# Patient Record
Sex: Female | Born: 1950
Health system: Southern US, Community
[De-identification: ages and names within clinical notes are randomized; demographics above are authoritative.]

## PROBLEM LIST (undated history)

## (undated) DIAGNOSIS — D869 Sarcoidosis, unspecified: Secondary | ICD-10-CM

## (undated) DIAGNOSIS — H409 Unspecified glaucoma: Secondary | ICD-10-CM

## (undated) DIAGNOSIS — E119 Type 2 diabetes mellitus without complications: Secondary | ICD-10-CM

## (undated) DIAGNOSIS — T4145XA Adverse effect of unspecified anesthetic, initial encounter: Secondary | ICD-10-CM

## (undated) DIAGNOSIS — S42209A Unspecified fracture of upper end of unspecified humerus, initial encounter for closed fracture: Secondary | ICD-10-CM

## (undated) DIAGNOSIS — Z9889 Other specified postprocedural states: Secondary | ICD-10-CM

## (undated) DIAGNOSIS — R112 Nausea with vomiting, unspecified: Secondary | ICD-10-CM

## (undated) DIAGNOSIS — I10 Essential (primary) hypertension: Secondary | ICD-10-CM

## (undated) DIAGNOSIS — N898 Other specified noninflammatory disorders of vagina: Secondary | ICD-10-CM

## (undated) DIAGNOSIS — I428 Other cardiomyopathies: Secondary | ICD-10-CM

## (undated) DIAGNOSIS — E785 Hyperlipidemia, unspecified: Secondary | ICD-10-CM

## (undated) DIAGNOSIS — D229 Melanocytic nevi, unspecified: Secondary | ICD-10-CM

## (undated) DIAGNOSIS — T8859XA Other complications of anesthesia, initial encounter: Secondary | ICD-10-CM

## (undated) DIAGNOSIS — I8390 Asymptomatic varicose veins of unspecified lower extremity: Secondary | ICD-10-CM

## (undated) HISTORY — DX: Other cardiomyopathies: I42.8

## (undated) HISTORY — DX: Asymptomatic varicose veins of unspecified lower extremity: I83.90

## (undated) HISTORY — DX: Unspecified glaucoma: H40.9

## (undated) HISTORY — PX: APPENDECTOMY: SHX54

## (undated) HISTORY — PX: COLONOSCOPY: SHX174

## (undated) HISTORY — PX: TUBAL LIGATION: SHX77

## (undated) HISTORY — PX: TONSILLECTOMY: SUR1361

## (undated) HISTORY — DX: Melanocytic nevi, unspecified: D22.9

## (undated) HISTORY — DX: Other specified noninflammatory disorders of vagina: N89.8

---
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
## 1998-08-24 ENCOUNTER — Encounter: Admission: RE | Admit: 1998-08-24 | Discharge: 1998-11-22 | Payer: Self-pay | Admitting: Internal Medicine

## 2000-10-04 ENCOUNTER — Encounter: Payer: Self-pay | Admitting: Internal Medicine

## 2000-10-04 ENCOUNTER — Ambulatory Visit (HOSPITAL_COMMUNITY): Admission: RE | Admit: 2000-10-04 | Discharge: 2000-10-04 | Payer: Self-pay | Admitting: Internal Medicine

## 2000-10-12 ENCOUNTER — Encounter: Payer: Self-pay | Admitting: Thoracic Surgery (Cardiothoracic Vascular Surgery)

## 2000-10-16 ENCOUNTER — Ambulatory Visit (HOSPITAL_COMMUNITY)
Admission: RE | Admit: 2000-10-16 | Discharge: 2000-10-16 | Payer: Self-pay | Admitting: Thoracic Surgery (Cardiothoracic Vascular Surgery)

## 2000-10-16 ENCOUNTER — Encounter: Payer: Self-pay | Admitting: Thoracic Surgery (Cardiothoracic Vascular Surgery)

## 2000-10-16 ENCOUNTER — Encounter (INDEPENDENT_AMBULATORY_CARE_PROVIDER_SITE_OTHER): Payer: Self-pay | Admitting: Specialist

## 2000-11-29 ENCOUNTER — Ambulatory Visit: Admission: RE | Admit: 2000-11-29 | Discharge: 2000-11-29 | Payer: Self-pay | Admitting: Critical Care Medicine

## 2001-07-27 ENCOUNTER — Encounter: Payer: Self-pay | Admitting: Internal Medicine

## 2001-07-27 ENCOUNTER — Ambulatory Visit (HOSPITAL_COMMUNITY): Admission: RE | Admit: 2001-07-27 | Discharge: 2001-07-27 | Payer: Self-pay | Admitting: Internal Medicine

## 2001-09-17 ENCOUNTER — Ambulatory Visit (HOSPITAL_COMMUNITY): Admission: RE | Admit: 2001-09-17 | Discharge: 2001-09-17 | Payer: Self-pay | Admitting: Internal Medicine

## 2001-12-18 ENCOUNTER — Ambulatory Visit (HOSPITAL_COMMUNITY): Admission: RE | Admit: 2001-12-18 | Discharge: 2001-12-18 | Payer: Self-pay | Admitting: Internal Medicine

## 2001-12-18 ENCOUNTER — Encounter: Payer: Self-pay | Admitting: Internal Medicine

## 2002-05-08 ENCOUNTER — Ambulatory Visit (HOSPITAL_COMMUNITY): Admission: RE | Admit: 2002-05-08 | Discharge: 2002-05-08 | Payer: Self-pay | Admitting: Urology

## 2002-05-08 ENCOUNTER — Encounter: Payer: Self-pay | Admitting: Urology

## 2003-03-10 ENCOUNTER — Encounter: Payer: Self-pay | Admitting: Internal Medicine

## 2003-03-10 ENCOUNTER — Ambulatory Visit (HOSPITAL_COMMUNITY): Admission: RE | Admit: 2003-03-10 | Discharge: 2003-03-10 | Payer: Self-pay | Admitting: Internal Medicine

## 2003-08-02 ENCOUNTER — Emergency Department (HOSPITAL_COMMUNITY): Admission: EM | Admit: 2003-08-02 | Discharge: 2003-08-03 | Payer: Self-pay | Admitting: Internal Medicine

## 2004-03-18 ENCOUNTER — Ambulatory Visit (HOSPITAL_COMMUNITY): Admission: RE | Admit: 2004-03-18 | Discharge: 2004-03-18 | Payer: Self-pay | Admitting: Internal Medicine

## 2005-05-02 ENCOUNTER — Ambulatory Visit (HOSPITAL_COMMUNITY): Admission: RE | Admit: 2005-05-02 | Discharge: 2005-05-02 | Payer: Self-pay | Admitting: Internal Medicine

## 2005-10-19 ENCOUNTER — Ambulatory Visit (HOSPITAL_COMMUNITY): Admission: RE | Admit: 2005-10-19 | Discharge: 2005-10-19 | Payer: Self-pay | Admitting: Internal Medicine

## 2005-11-10 ENCOUNTER — Ambulatory Visit (HOSPITAL_COMMUNITY): Admission: RE | Admit: 2005-11-10 | Discharge: 2005-11-10 | Payer: Self-pay | Admitting: Internal Medicine

## 2006-05-25 ENCOUNTER — Ambulatory Visit (HOSPITAL_COMMUNITY): Admission: RE | Admit: 2006-05-25 | Discharge: 2006-05-25 | Payer: Self-pay | Admitting: Internal Medicine

## 2006-06-15 ENCOUNTER — Ambulatory Visit (HOSPITAL_COMMUNITY): Admission: RE | Admit: 2006-06-15 | Discharge: 2006-06-15 | Payer: Self-pay | Admitting: Internal Medicine

## 2006-10-23 ENCOUNTER — Ambulatory Visit (HOSPITAL_COMMUNITY): Admission: RE | Admit: 2006-10-23 | Discharge: 2006-10-23 | Payer: Self-pay | Admitting: Internal Medicine

## 2007-06-08 ENCOUNTER — Ambulatory Visit (HOSPITAL_COMMUNITY): Admission: RE | Admit: 2007-06-08 | Discharge: 2007-06-08 | Payer: Self-pay | Admitting: Internal Medicine

## 2008-05-08 ENCOUNTER — Ambulatory Visit (HOSPITAL_COMMUNITY): Admission: RE | Admit: 2008-05-08 | Discharge: 2008-05-08 | Payer: Self-pay | Admitting: Internal Medicine

## 2008-06-10 ENCOUNTER — Ambulatory Visit (HOSPITAL_COMMUNITY): Admission: RE | Admit: 2008-06-10 | Discharge: 2008-06-10 | Payer: Self-pay | Admitting: Internal Medicine

## 2009-05-19 ENCOUNTER — Encounter (HOSPITAL_COMMUNITY): Admission: RE | Admit: 2009-05-19 | Discharge: 2009-06-18 | Payer: Self-pay | Admitting: Orthopedic Surgery

## 2009-06-16 ENCOUNTER — Ambulatory Visit (HOSPITAL_COMMUNITY): Admission: RE | Admit: 2009-06-16 | Discharge: 2009-06-16 | Payer: Self-pay | Admitting: Internal Medicine

## 2010-11-12 NOTE — Op Note (Signed)
[place]  Patient:    [name]                           MRN: [id number] Proc. Date: 10/16/00 Adm. Date:  [id number] Disc. Date: [id number] Attending:  Charlett Lango CC:         Dr. Carylon Perches   Operative Report  PREOPERATIVE DIAGNOSIS:  Mediastinal adenopathy.  POSTOPERATIVE DIAGNOSIS:  Mediastinal adenopathy.  Probable sarcoidosis.  OPERATION PERFORMED:  Bronchoscopy and mediastinoscopy.  SURGEON:  Salvatore Decent. Dorris Fetch, M.D.  ASSISTANT:  None.  ANESTHESIA:  General.  FINDINGS:  Grossly enlarged level 4R and 7 lymph nodes.  The nodes were friable and necrotic and frozen section revealed noncaseating granulomas consistent with sarcoidosis.  INDICATIONS FOR PROCEDURE:  The patient is a 60 year old white female who presents with a one-month history of cough and shortness of breath with exertion.  She had a chest x-ray performed which showed pulmonary nodules. She then underwent a CT scan which showed three separate pulmonary nodules as well as significant mediastinal and hilar adenopathy.  Differential diagnosis includes fungal, AFB infection, sarcoidosis and lymphoma.  The patient was referred for diagnostic biopsies.  The indications, risks, benefits and alternative procedures were discussed in detail with the patient and she understood the risks of mediastinoscopy and agreed to proceed.  DESCRIPTION OF PROCEDURE:  Ms. Rider was brought to the preop holding area on October 16, 2000.  Lines were placed for intravenous access and arterial blood pressure monitoring.  The patient was taken to the operating room, anesthetized and intubated.  Flexible fiberoptic bronchoscopy was performed via the endotracheal tube.  It revealed no endobronchial lesions.  There was some blunting at the carina.  Washings were taken from the right, middle and lower lobes and sent for cytology.  Next, the neck and chest were prepped and draped in the usual  fashion.  A transverse incision was made one fingerbreadth above the sternal notch and carried through the skin and subcutaneous tissues.  The dissection then was continued down in the midline to the pretracheal fascia which was identified and incised.  The pretracheal plane then was developed bluntly into the mediastinum using digital dissection.  The mediastinoscope then was inserted and blunt dissection was continued exposing the level 7 lymph nodes.  There were two grossly enlarged lymph nodes that were easily visible.  Biopsies of one were taken.  Essentially the whole node was biopsied in pieces.  A portion was sent for culture, another portion was sent for frozen section as well as permanent section, possible lymphoma studies.  Hemostasis was achieved with cautery.  There was minimal bleeding from that area.  Next a level 4R lymph node was identified.  This was also very easily identified.  It was grossly enlarged.  Biopsies were taken.  The middle portion of the node was very necrotic and multiple biopsies were taken.  Hemostasis was achieved with cautery.  These specimens were also sent for frozen and permanent section. After visual inspection for hemostasis, the wound was then packed with dry gauze for five minutes.  The packing was removed.  The mediastinoscope was reinserted and there was good hemostasis.  The wound then was once again packed for an additional five minutes.  In the meantime the frozen section came back with evidence of noncaseating granulomas, likely sarcoidosis. Adequate tissue was present to make a final diagnosis.  The packing was removed.  There was minimal staining.  The wound then was closed with interrupted 3-0 Vicryl sutures in the platysmal layer and the skin was closed with a 4-0 Vicryl subcuticular suture.  Sponge, needle and instrument counts were correct at the end of the procedure.  The patient tolerated the procedure well and was extubated in the  operating room and taken to the recovery room in stable condition. DD:  10/16/00 TD:  10/17/00 Job: [id number]

## 2010-11-12 NOTE — Op Note (Signed)
[facility]  Patient:    [name], [name] Visit Number: [id number] MRN: [id number]          Service Type: END Location: DAY Attending Physician:  Jonathon Bellows Dictated by:   Roetta Sessions, M.D. Proc. Date: 09/17/01 Admit Date:  09/17/2001   CC:         Carylon Perches, M.D.   Operative Report  INDICATIONS:  The patient is a 60 year old lady referred at the courtesy of Dr. Carylon Perches for colorectal cancer screening.  She is devoid of any lower GI tract symptoms.  There is no family history of colorectal neoplasia.  She has never had a lower GI tract image.  Colon cancer screening is now being undertaken via colonoscopy.  I have discussed this approach with [name]. The potential risks, benefits, and alternatives have been reviewed and questions answered.  She is agreeable. Please see my handwritten H&P for more information.  PROCEDURE NOTE:  The patient was placed in the left lateral decubitus position.  O2 saturation, blood pressure, pulse, and respirations were monitored throughout the entire procedure.  CONSCIOUS SEDATION:  Versed 3 mg IV and Demerol 50 mg IV in divided doses.  INSTRUMENT:  Olympus video chip colonoscope.  FINDINGS:  Digital rectal exam revealed no abnormalities.  ENDOSCOPIC FINDINGS:  The prep was good.  RECTUM: Examination of the rectal mucosa including a retroflexed view at the anal verge revealed no abnormalities.  COLON: The colonic mucosa was surveyed from the rectosigmoid junction through the left transverse and right colon to the area of the appendiceal orifice, ileocecal valve, and cecum. These structures were well-seen and photographed for the record.  The colonic mucosa all the way to the cecum appeared normal. From the level of the cecum and ileocecal valve, the scope was slowly withdrawn.  All previous imaged mucosal surfaces were again seen.  Again, no abnormalities were observed. The patient tolerated the procedure  well and was reacted in endoscopy.  IMPRESSION: 1. Internal hemorrhoids; otherwise normal rectum. 2. Normal colon.  RECOMMENDATIONS:  Repeat colonoscopy in ten years. Dictated by:   Roetta Sessions, M.D. Attending Physician:  Jonathon Bellows DD:  09/17/01 TD:  09/18/01 Job: [id number]

## 2011-01-10 ENCOUNTER — Other Ambulatory Visit (HOSPITAL_COMMUNITY): Payer: Self-pay | Admitting: Internal Medicine

## 2011-01-10 DIAGNOSIS — Z139 Encounter for screening, unspecified: Secondary | ICD-10-CM

## 2011-01-11 ENCOUNTER — Ambulatory Visit (HOSPITAL_COMMUNITY)
Admission: RE | Admit: 2011-01-11 | Discharge: 2011-01-11 | Disposition: A | Discharge status: Home / Self Care | Payer: BC Managed Care – PPO | Source: Ambulatory Visit | Attending: Internal Medicine | Admitting: Internal Medicine

## 2011-01-11 DIAGNOSIS — Z1231 Encounter for screening mammogram for malignant neoplasm of breast: Secondary | ICD-10-CM | POA: Insufficient documentation

## 2011-01-11 DIAGNOSIS — Z139 Encounter for screening, unspecified: Secondary | ICD-10-CM

## 2011-05-26 ENCOUNTER — Other Ambulatory Visit (HOSPITAL_COMMUNITY)
Admission: RE | Admit: 2011-05-26 | Discharge: 2011-05-26 | Disposition: A | Discharge status: Home / Self Care | Payer: BC Managed Care – PPO | Source: Ambulatory Visit | Attending: Obstetrics and Gynecology | Admitting: Obstetrics and Gynecology

## 2011-05-26 ENCOUNTER — Other Ambulatory Visit: Payer: Self-pay | Admitting: Adult Health

## 2011-05-26 DIAGNOSIS — Z01419 Encounter for gynecological examination (general) (routine) without abnormal findings: Secondary | ICD-10-CM | POA: Insufficient documentation

## 2011-12-15 ENCOUNTER — Other Ambulatory Visit (HOSPITAL_COMMUNITY): Payer: Self-pay | Admitting: Internal Medicine

## 2011-12-15 DIAGNOSIS — Z139 Encounter for screening, unspecified: Secondary | ICD-10-CM

## 2012-01-24 ENCOUNTER — Ambulatory Visit (HOSPITAL_COMMUNITY): Payer: BC Managed Care – PPO

## 2012-01-31 ENCOUNTER — Ambulatory Visit (HOSPITAL_COMMUNITY)
Admission: RE | Admit: 2012-01-31 | Discharge: 2012-01-31 | Disposition: A | Discharge status: Home / Self Care | Payer: BC Managed Care – PPO | Source: Ambulatory Visit | Attending: Internal Medicine | Admitting: Internal Medicine

## 2012-01-31 DIAGNOSIS — Z139 Encounter for screening, unspecified: Secondary | ICD-10-CM

## 2012-01-31 DIAGNOSIS — Z1231 Encounter for screening mammogram for malignant neoplasm of breast: Secondary | ICD-10-CM | POA: Insufficient documentation

## 2012-05-14 ENCOUNTER — Telehealth: Payer: Self-pay

## 2012-05-14 NOTE — Telephone Encounter (Signed)
Pt was referred ;by Dr. Ouida Sills for screening colonoscopy. She called and is going out of town, she would like a call back toward the end of Dec. For sometime in Jan, preferably a Mon. ) RMR pt). ( She is on my call list for Dec.). Last one done 09/17/2001.

## 2012-06-28 ENCOUNTER — Other Ambulatory Visit: Payer: Self-pay

## 2012-06-28 ENCOUNTER — Telehealth: Payer: Self-pay

## 2012-06-28 DIAGNOSIS — Z139 Encounter for screening, unspecified: Secondary | ICD-10-CM

## 2012-06-28 NOTE — Telephone Encounter (Signed)
Gastroenterology Pre-Procedure Form  Last colonoscopy 08/2001 by Dr. Jena Gauss  Request Date: 07/23/2012     Requesting Physician: Dr. Ouida Sills     PATIENT INFORMATION:  [name] is a 62 y.o., female (DOB=12/29/1950).  PROCEDURE: Procedure(s) requested: colonoscopy Procedure Reason: screening for colon cancer  PATIENT REVIEW QUESTIONS: The patient reports the following:   1. Diabetes Melitis: yes  2. Joint replacements in the past 12 months: no 3. Major health problems in the past 3 months: no 4. Has an artificial valve or MVP:no 5. Has been advised in past to take antibiotics in advance of a procedure like teeth cleaning: no}    MEDICATIONS & ALLERGIES:    Patient reports the following regarding taking any blood thinners:   Plavix? no Aspirin?yes  Coumadin?  no  Patient confirms/reports the following medications:  Current Outpatient Prescriptions  Medication Sig Dispense Refill  . aspirin 81 MG tablet Take 81 mg by mouth daily.      Marland Kitchen lisinopril (PRINIVIL,ZESTRIL) 40 MG tablet Take 40 mg by mouth daily.      . metFORMIN (GLUMETZA) 1000 MG (MOD) 24 hr tablet Take 1,000 mg by mouth 2 (two) times daily.      . Multiple Vitamin (MULTIVITAMIN) tablet Take 1 tablet by mouth daily.        Patient confirms/reports the following allergies:  Allergies  Allergen Reactions  . Ciprocinonide (Fluocinolone) Other (See Comments)    Decreased BP  . Macrobid (Nitrofurantoin Macrocrystal) Nausea And Vomiting  . Codeine Nausea And Vomiting  . Sulfa Antibiotics Other (See Comments)    Pt cannot remember    Patient is appropriate to schedule for requested procedure(s): yes  AUTHORIZATION INFORMATION Primary Insurance:   ID #:   Group #:  Pre-Cert / Auth required:  Pre-Cert / Auth #:   Secondary Insurance:   ID #:   Group #:  Pre-Cert / Auth required:  Pre-Cert / Auth #:   No orders of the defined types were placed in this encounter.    SCHEDULE INFORMATION: Procedure has been  scheduled as follows:  Date: 07/23/2012    Time: 7:30 AM  Location: [facility] Short Stay  This Gastroenterology Pre-Precedure Form is being routed to the following provider(s) for review: R. Roetta Sessions, MD

## 2012-07-02 ENCOUNTER — Other Ambulatory Visit (HOSPITAL_COMMUNITY): Payer: Self-pay | Admitting: Internal Medicine

## 2012-07-02 ENCOUNTER — Ambulatory Visit (HOSPITAL_COMMUNITY)
Admission: RE | Admit: 2012-07-02 | Discharge: 2012-07-02 | Disposition: A | Discharge status: Home / Self Care | Payer: BC Managed Care – PPO | Source: Ambulatory Visit | Attending: Internal Medicine | Admitting: Internal Medicine

## 2012-07-02 DIAGNOSIS — R0602 Shortness of breath: Secondary | ICD-10-CM | POA: Insufficient documentation

## 2012-07-02 MED ORDER — PEG-KCL-NACL-NASULF-NA ASC-C 100 G PO SOLR: 1.0000 | ORAL | Status: DC

## 2012-07-02 NOTE — Telephone Encounter (Signed)
Rx sent to [facility] and instructions mailed to pt.

## 2012-07-02 NOTE — Telephone Encounter (Signed)
OK to proceed with colonoscopy Take half of glucophage the day prior to the procedure Hold diabetes medications day of procedure Bring all medications and/or any insulin to the hospital the day of the procedure. Follow blood sugars, call us or PCP if any problems. Thanks

## 2012-07-03 ENCOUNTER — Other Ambulatory Visit (HOSPITAL_COMMUNITY): Payer: Self-pay | Admitting: Internal Medicine

## 2012-07-03 DIAGNOSIS — R0602 Shortness of breath: Secondary | ICD-10-CM

## 2012-07-04 ENCOUNTER — Ambulatory Visit (HOSPITAL_COMMUNITY)
Admission: RE | Admit: 2012-07-04 | Discharge: 2012-07-04 | Disposition: A | Discharge status: Home / Self Care | Payer: BC Managed Care – PPO | Source: Ambulatory Visit | Attending: Internal Medicine | Admitting: Internal Medicine

## 2012-07-04 ENCOUNTER — Encounter (HOSPITAL_COMMUNITY): Payer: Self-pay | Admitting: Pharmacy Technician

## 2012-07-04 ENCOUNTER — Other Ambulatory Visit (HOSPITAL_COMMUNITY): Payer: Self-pay | Admitting: Internal Medicine

## 2012-07-04 DIAGNOSIS — R0602 Shortness of breath: Secondary | ICD-10-CM | POA: Insufficient documentation

## 2012-07-04 DIAGNOSIS — R799 Abnormal finding of blood chemistry, unspecified: Secondary | ICD-10-CM | POA: Insufficient documentation

## 2012-07-04 DIAGNOSIS — R918 Other nonspecific abnormal finding of lung field: Secondary | ICD-10-CM | POA: Insufficient documentation

## 2012-07-04 MED ORDER — IOHEXOL 350 MG/ML SOLN
100.0000 mL | Freq: Once | INTRAVENOUS | Status: AC | PRN
  Administered 2012-07-04: 100 mL via INTRAVENOUS

## 2012-07-23 ENCOUNTER — Ambulatory Visit (HOSPITAL_COMMUNITY)
Admission: RE | Admit: 2012-07-23 | Discharge: 2012-07-23 | Disposition: A | Discharge status: Home / Self Care | Payer: BC Managed Care – PPO | Source: Ambulatory Visit | Attending: Internal Medicine | Admitting: Internal Medicine

## 2012-07-23 ENCOUNTER — Encounter (HOSPITAL_COMMUNITY): Payer: Self-pay | Admitting: *Deleted

## 2012-07-23 ENCOUNTER — Encounter (HOSPITAL_COMMUNITY)
Admission: RE | Disposition: A | Discharge status: Home / Self Care | Payer: Self-pay | Source: Ambulatory Visit | Attending: Internal Medicine

## 2012-07-23 DIAGNOSIS — Z1211 Encounter for screening for malignant neoplasm of colon: Secondary | ICD-10-CM | POA: Insufficient documentation

## 2012-07-23 DIAGNOSIS — E119 Type 2 diabetes mellitus without complications: Secondary | ICD-10-CM | POA: Insufficient documentation

## 2012-07-23 DIAGNOSIS — Z139 Encounter for screening, unspecified: Secondary | ICD-10-CM

## 2012-07-23 DIAGNOSIS — Z01812 Encounter for preprocedural laboratory examination: Secondary | ICD-10-CM | POA: Insufficient documentation

## 2012-07-23 DIAGNOSIS — D126 Benign neoplasm of colon, unspecified: Secondary | ICD-10-CM

## 2012-07-23 DIAGNOSIS — I10 Essential (primary) hypertension: Secondary | ICD-10-CM | POA: Insufficient documentation

## 2012-07-23 HISTORY — DX: Type 2 diabetes mellitus without complications: E11.9

## 2012-07-23 HISTORY — DX: Essential (primary) hypertension: I10

## 2012-07-23 HISTORY — PX: COLONOSCOPY: SHX5424

## 2012-07-23 LAB — GLUCOSE, CAPILLARY: Glucose-Capillary: 144 mg/dL — ABNORMAL HIGH (ref 70–99)

## 2012-07-23 SURGERY — COLONOSCOPY
Anesthesia: Moderate Sedation

## 2012-07-23 MED ORDER — STERILE WATER FOR IRRIGATION IR SOLN
Status: DC | PRN
  Administered 2012-07-23: 08:00:00

## 2012-07-23 MED ORDER — MEPERIDINE HCL 100 MG/ML IJ SOLN
INTRAMUSCULAR | Status: DC | PRN
  Administered 2012-07-23: 25 mg via INTRAVENOUS
  Administered 2012-07-23: 50 mg via INTRAVENOUS

## 2012-07-23 MED ORDER — ONDANSETRON HCL 4 MG/2ML IJ SOLN
INTRAMUSCULAR | Status: DC | PRN
  Administered 2012-07-23: 4 mg via INTRAVENOUS

## 2012-07-23 MED ORDER — SODIUM CHLORIDE 0.45 % IV SOLN
INTRAVENOUS | Status: DC
  Administered 2012-07-23: 07:00:00 via INTRAVENOUS

## 2012-07-23 MED ORDER — MIDAZOLAM HCL 5 MG/5ML IJ SOLN
INTRAMUSCULAR | Status: AC
  Filled 2012-07-23: qty 10

## 2012-07-23 MED ORDER — ONDANSETRON HCL 4 MG/2ML IJ SOLN
INTRAMUSCULAR | Status: AC
  Filled 2012-07-23: qty 2

## 2012-07-23 MED ORDER — MEPERIDINE HCL 100 MG/ML IJ SOLN
INTRAMUSCULAR | Status: AC
  Filled 2012-07-23: qty 1

## 2012-07-23 MED ORDER — MIDAZOLAM HCL 5 MG/5ML IJ SOLN
INTRAMUSCULAR | Status: DC | PRN
  Administered 2012-07-23: 2 mg via INTRAVENOUS
  Administered 2012-07-23: 1 mg via INTRAVENOUS
  Administered 2012-07-23: 2 mg via INTRAVENOUS

## 2012-07-23 NOTE — Op Note (Signed)
[facility] [address] [place], [address]   COLONOSCOPY PROCEDURE REPORT  PATIENT: Paige, Hatfield  MR#:         [id number] BIRTHDATE: 06/27/1951 , 61  yrs. old GENDER: Female ENDOSCOPIST: R.  Roetta Sessions, MD FACP FACG REFERRED BY:  Carylon Perches, M.D. PROCEDURE DATE:  07/23/2012 PROCEDURE:     Ileocolonoscopy with snare polypectomy  INDICATIONS: average risk colorectal cancer screening  INFORMED CONSENT:  The risks, benefits, alternatives and imponderables including but not limited to bleeding, perforation as well as the possibility of a missed lesion have been reviewed.  The potential for biopsy, lesion removal, etc. have also been discussed.  Questions have been answered.  All parties agreeable. Please see the history and physical in the medical record for more information.  MEDICATIONS: Versed 5 mg IVDemerol 75 mg milligrams IV in divided doses. Zofran 4 mg IV  DESCRIPTION OF PROCEDURE:  After a digital rectal exam was performed, the Pentax Colonoscope [id number]  colonoscope was advanced from the anus through the rectum and colon to the area of the cecum, ileocecal valve and appendiceal orifice.  The cecum was deeply intubated.  These structures were well-seen and photographed for the record.  From the level of the cecum and ileocecal valve, the scope was slowly and cautiously withdrawn.  The mucosal surfaces were carefully surveyed utilizing scope tip deflection to facilitate fold flattening as needed.  The scope was pulled down into the rectum where a thorough examination including retroflexion was performed.    FINDINGS:  Adequate preparation. Normal rectum. (2) 4x6 mm polyps in the sigmoid segment having the appearance of serrated adenomatous; otherwise, the remainder of the colonic mucosa appeared normal. The distal 10 cm of terminal ileal mucosa appeared normal.  THERAPEUTIC / DIAGNOSTIC MANEUVERS PERFORMED:  The 2 above-mentioned polyps were  cold snare removed.  COMPLICATIONS: none  CECAL WITHDRAWAL TIME:  13 minutes  IMPRESSION:  Colonic polyps-removed as described above  RECOMMENDATIONS: Followup on pathology.   _______________________________ eSigned:  R. Roetta Sessions, MD FACP [facility] 07/23/2012 8:39 AM   CC:    PATIENT NAME:  Paige, Hatfield MR#: [id number]

## 2012-07-23 NOTE — H&P (Signed)
Primary Care Physician:  Carylon Perches, MD Primary Gastroenterologist:  Dr. Jena Gauss  Pre-Procedure History & Physical: HPI:  [name] is a 62 y.o. female is here for a screening colonoscopy. No bowel symptoms. No family history of colon cancer or polyps. Reported negative colonoscopy 10 years ago.  Past Medical History  Diagnosis Date  . Hypertension   . Diabetes mellitus without complication     Past Surgical History  Procedure Date  . Colonoscopy   . Appendectomy   . Cesarean section     X 3  . Tonsillectomy   . Tubal ligation     Prior to Admission medications   Medication Sig Start Date End Date Taking? Authorizing Provider  dorzolamide-timolol (COSOPT) 22.3-6.8 MG/ML ophthalmic solution Place 1 drop into both eyes 2 (two) times daily.   Yes Historical Provider, MD  lisinopril (PRINIVIL,ZESTRIL) 40 MG tablet Take 40 mg by mouth daily.   Yes Historical Provider, MD  metFORMIN (GLUCOPHAGE) 1000 MG tablet Take 1,000 mg by mouth 2 (two) times daily.   Yes Historical Provider, MD  peg 3350 powder (MOVIPREP) 100 G SOLR Take 1 kit (100 g total) by mouth as directed. 07/02/12  Yes [place] Bali, MD  aspirin 81 MG tablet Take 81 mg by mouth daily.    Historical Provider, MD  Multiple Vitamin (MULTIVITAMIN) tablet Take 1 tablet by mouth daily.    Historical Provider, MD    Allergies as of 06/28/2012 - Review Complete 06/28/2012  Allergen Reaction Noted  . Ciprocinonide (fluocinolone) Other (See Comments) 06/28/2012  . Macrobid (nitrofurantoin macrocrystal) Nausea And Vomiting 06/28/2012  . Codeine Nausea And Vomiting 06/28/2012  . Sulfa antibiotics Other (See Comments) 06/28/2012    Family History  Problem Relation Age of Onset  . Colon cancer Neg Hx     History   Social History  . Marital Status: Widowed    Spouse Name: N/A    Number of Children: N/A  . Years of Education: N/A   Occupational History  . Not on file.   Social History Main Topics  . Smoking status:  Never Smoker   . Smokeless tobacco: Not on file  . Alcohol Use: No  . Drug Use: No  . Sexually Active:    Other Topics Concern  . Not on file   Social History Narrative  . No narrative on file    Review of Systems: See HPI, otherwise negative ROS  Physical Exam: BP 151/71  Pulse 77  Temp 98.4 F (36.9 C) (Oral)  Resp 14  Ht 5' 6.5" (1.689 m)  Wt 230 lb (104.327 kg)  BMI 36.57 kg/m2  SpO2 96% General:   Alert,  Well-developed, well-nourished, pleasant and cooperative in NAD Head:  Normocephalic and atraumatic. Eyes:  Sclera clear, no icterus.   Conjunctiva pink. Ears:  Normal auditory acuity. Nose:  No deformity, discharge,  or lesions. Mouth:  No deformity or lesions, dentition normal. Neck:  Supple; no masses or thyromegaly. Lungs:  Clear throughout to auscultation.   No wheezes, crackles, or rhonchi. No acute distress. Heart:  Regular rate and rhythm; no murmurs, clicks, rubs,  or gallops. Abdomen:  Obese. Positive bowel sounds soft nontender without appreciable mass or organomegaly Msk:  Symmetrical without gross deformities. Normal posture. Pulses:  Normal pulses noted. Extremities:  Without clubbing or edema. Neurologic:  Alert and  oriented x4;  grossly normal neurologically. Skin:  Intact without significant lesions or rashes. Cervical Nodes:  No significant cervical adenopathy. Psych:  Alert and cooperative.  Normal mood and affect.  Impression/Plan: [name] is now here to undergo a screening colonoscopy.  Average risk screening examination.  Risks, benefits, limitations, imponderables and alternatives regarding colonoscopy have been reviewed with the patient. Questions have been answered. All parties agreeable.

## 2012-07-25 ENCOUNTER — Encounter: Payer: Self-pay | Admitting: Internal Medicine

## 2012-07-25 ENCOUNTER — Encounter (HOSPITAL_COMMUNITY): Payer: Self-pay | Admitting: Internal Medicine

## 2012-07-26 ENCOUNTER — Encounter: Payer: Self-pay | Admitting: *Deleted

## 2013-03-12 ENCOUNTER — Other Ambulatory Visit (HOSPITAL_COMMUNITY): Payer: Self-pay | Admitting: Internal Medicine

## 2013-03-12 DIAGNOSIS — Z139 Encounter for screening, unspecified: Secondary | ICD-10-CM

## 2013-04-09 ENCOUNTER — Ambulatory Visit (HOSPITAL_COMMUNITY): Payer: BC Managed Care – PPO

## 2013-04-16 ENCOUNTER — Ambulatory Visit (HOSPITAL_COMMUNITY)
Admission: RE | Admit: 2013-04-16 | Discharge: 2013-04-16 | Disposition: A | Discharge status: Home / Self Care | Payer: BC Managed Care – PPO | Source: Ambulatory Visit | Attending: Internal Medicine | Admitting: Internal Medicine

## 2013-04-16 DIAGNOSIS — Z1231 Encounter for screening mammogram for malignant neoplasm of breast: Secondary | ICD-10-CM | POA: Insufficient documentation

## 2013-04-16 DIAGNOSIS — Z139 Encounter for screening, unspecified: Secondary | ICD-10-CM

## 2013-11-23 IMAGING — CT CT ANGIO CHEST
1 of 7 series · 4 of 36 positions shown · IV contrast (omnipaque)
Comparison: Chest radiograph 07/02/2012.

CLINICAL DATA: Shortness of breath with elevated D-dimer.  Recent
plane trip from [place].

CT ANGIOGRAPHY CHEST
TECHNIQUE: Multidetector CT imaging of the chest using the
standard protocol during bolus administration of intravenous
contrast. Multiplanar reconstructed images including MIPs were
obtained and reviewed to evaluate the vascular anatomy.
Contrast: 100mL OMNIPAQUE IOHEXOL 350 MG/ML SOLN

[Series 6: pe 3.0 b40f · axial · 0.70mm/px · z∈[-240,-105]mm · 4 of 75 slices shown]
[im 15/75  lung]
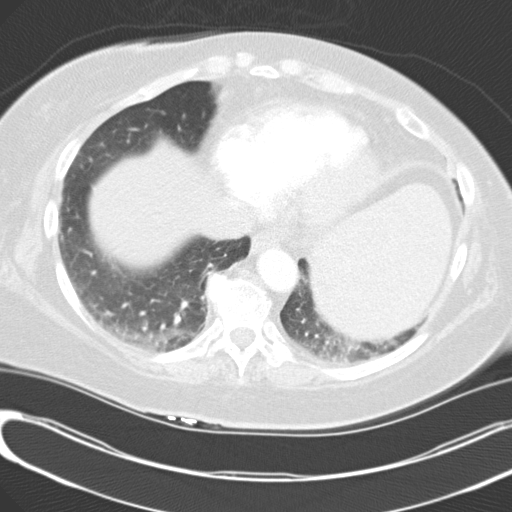
[im 30/75  mediastinal]
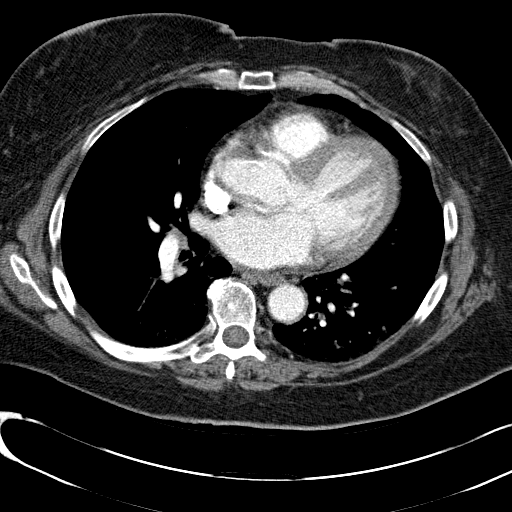
[im 45/75  lung]
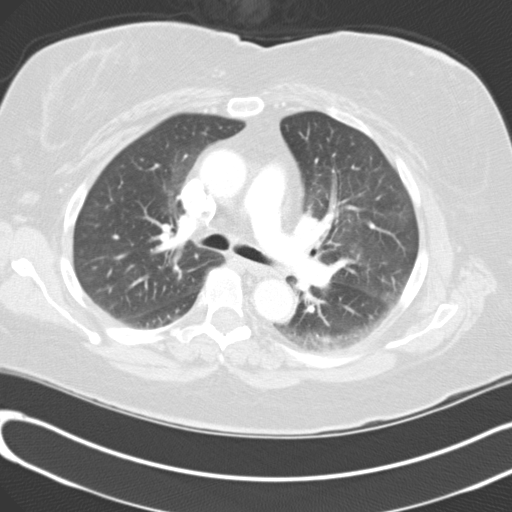
[im 60/75  mediastinal]
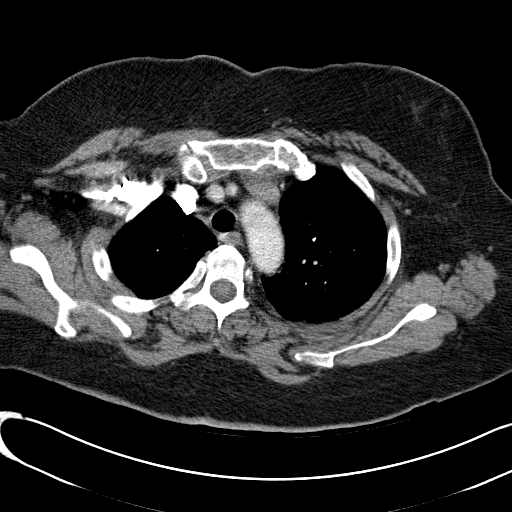

[4 of 36 positions shown; findings below may reference images not displayed]

FINDINGS: No pulmonary embolus.  There may be an 11 mm low
attenuation lesion in the right lobe of the thyroid.  Mediastinal
lymph nodes are not enlarged by CT size criteria.  No hilar or
axillary adenopathy.  Heart is mildly enlarged.  No pericardial
effusion.

An ovoid nodule along the minor fissure measures 5 mm (4 x 6 mm).
Mild dependent atelectasis. Scattered patchy ground-glass and trace
septal thickening at the lung bases.  Lingular nodular densities
measure up to approximately 4 mm (image 42).  No pleural fluid.
Airway is unremarkable.

Incidental imaging of the upper abdomen shows prominence of the
spleen, which is incompletely imaged.  No worrisome lytic or
sclerotic lesions.  Degenerative changes are seen in the spine.
IMPRESSION: 1.  No pulmonary embolus.
2.  Question mild pulmonary edema.
3.  Tiny nodular densities in the lingula are nonspecific. If the
patient is at high risk for bronchogenic carcinoma, follow-up chest
CT at 1 year is recommended.  If the patient is at low risk, no
follow-up is needed.  This recommendation follows the consensus
statement: Guidelines for Management of Small Pulmonary Nodules
Detected on CT Scans:  A Statement from the [HOSPITAL] as
4.  Spleen appears prominent but is incompletely imaged.

## 2013-12-23 ENCOUNTER — Other Ambulatory Visit: Payer: Self-pay | Admitting: *Deleted

## 2013-12-23 DIAGNOSIS — I83893 Varicose veins of bilateral lower extremities with other complications: Secondary | ICD-10-CM

## 2014-02-27 ENCOUNTER — Encounter: Payer: BC Managed Care – PPO | Admitting: Vascular Surgery

## 2014-02-27 ENCOUNTER — Encounter (HOSPITAL_COMMUNITY): Payer: BC Managed Care – PPO

## 2014-03-06 ENCOUNTER — Encounter: Payer: BC Managed Care – PPO | Admitting: Vascular Surgery

## 2014-03-06 ENCOUNTER — Encounter (HOSPITAL_COMMUNITY): Payer: BC Managed Care – PPO

## 2014-03-19 ENCOUNTER — Other Ambulatory Visit (HOSPITAL_COMMUNITY): Payer: Self-pay | Admitting: Internal Medicine

## 2014-03-19 DIAGNOSIS — Z1231 Encounter for screening mammogram for malignant neoplasm of breast: Secondary | ICD-10-CM

## 2014-03-26 ENCOUNTER — Encounter: Payer: Self-pay | Admitting: Vascular Surgery

## 2014-03-27 ENCOUNTER — Ambulatory Visit (INDEPENDENT_AMBULATORY_CARE_PROVIDER_SITE_OTHER): Payer: BC Managed Care – PPO | Admitting: Vascular Surgery

## 2014-03-27 ENCOUNTER — Other Ambulatory Visit: Payer: Self-pay | Admitting: Vascular Surgery

## 2014-03-27 ENCOUNTER — Encounter: Payer: Self-pay | Admitting: Vascular Surgery

## 2014-03-27 ENCOUNTER — Ambulatory Visit (HOSPITAL_COMMUNITY)
Admission: RE | Admit: 2014-03-27 | Discharge: 2014-03-27 | Disposition: A | Discharge status: Home / Self Care | Payer: BC Managed Care – PPO | Source: Ambulatory Visit | Attending: Vascular Surgery | Admitting: Vascular Surgery

## 2014-03-27 VITALS — BP 150/85 | HR 79 | Temp 98.3°F | Resp 16 | Ht 66.5 in | Wt 231.0 lb

## 2014-03-27 DIAGNOSIS — I83819 Varicose veins of unspecified lower extremities with pain: Secondary | ICD-10-CM | POA: Insufficient documentation

## 2014-03-27 DIAGNOSIS — I8393 Asymptomatic varicose veins of bilateral lower extremities: Secondary | ICD-10-CM

## 2014-03-27 DIAGNOSIS — I83813 Varicose veins of bilateral lower extremities with pain: Secondary | ICD-10-CM

## 2014-03-27 NOTE — Progress Notes (Signed)
[facility] [place] HISTORY AND PHYSICAL   History of Present Illness:  Patient is a 63 y.o. year old female who presents for evaluation of symptomatic varicose veins. The patient has developed achiness fullness heaviness primarily in the left lower extremity over the last several years. She has not worn compression in the past. She's had no prior bleeding episode. She does not really have any skin itching or ulceration. She does have a family history of varicose veins in her mother and sister. She denies prior history of DVT. Other medical problems include diabetes and hypertension both of which are stable.  Past Medical History  Diagnosis Date  . Hypertension   . Diabetes mellitus without complication     Past Surgical History  Procedure Laterality Date  . Colonoscopy    . Appendectomy    . Cesarean section      X 3  . Tonsillectomy    . Tubal ligation    . Colonoscopy  07/23/2012    Procedure: COLONOSCOPY;  Surgeon: Daneil Dolin, MD;  Location: [facility];  Service: Endoscopy;  Laterality: N/A;  7:30 AM    Social History History  Substance Use Topics  . Smoking status: Never Smoker   . Smokeless tobacco: Not on file  . Alcohol Use: No    Family History Family History  Problem Relation Age of Onset  . Colon cancer Neg Hx   . Varicose Veins Mother   . Deep vein thrombosis Mother   . Cancer Father   . Varicose Veins Father   . Deep vein thrombosis Father   . Diabetes Father   . Heart disease Father   . Hypertension Father   . Diabetes Sister   . Heart disease Sister   . Hyperlipidemia Sister   . Hypertension Sister   . Hypertension Son     Allergies  Allergies  Allergen Reactions  . Macrobid [Nitrofurantoin Macrocrystal] Nausea And Vomiting  . Codeine Nausea And Vomiting  . Sulfa Antibiotics Other (See Comments)    Pt cannot remember  . Ciprofloxacin     Decreased BP     Current Outpatient Prescriptions  Medication Sig Dispense  Refill  . aspirin 81 MG tablet Take 81 mg by mouth daily.      . dorzolamide-timolol (COSOPT) 22.3-6.8 MG/ML ophthalmic solution Place 1 drop into both eyes 2 (two) times daily.      . Liraglutide (VICTOZA) 18 MG/3ML SOPN Inject into the skin.      Marland Kitchen lisinopril (PRINIVIL,ZESTRIL) 40 MG tablet Take 40 mg by mouth daily.      . metFORMIN (GLUCOPHAGE) 1000 MG tablet Take 1,000 mg by mouth 2 (two) times daily.      . Multiple Vitamin (MULTIVITAMIN) tablet Take 1 tablet by mouth daily.      . peg 3350 powder (MOVIPREP) 100 G SOLR Take 1 kit (100 g total) by mouth as directed.  1 kit  0   No current facility-administered medications for this visit.    ROS:   General:  + weight loss 10 pounds intentionally, Fever, chills  HEENT: No recent headaches, no nasal bleeding, no visual changes, no sore throat  Neurologic: No dizziness, blackouts, seizures. No recent symptoms of stroke or mini- stroke. No recent episodes of slurred speech, or temporary blindness.  Cardiac: No recent episodes of chest pain/pressure, no shortness of breath at rest.  No shortness of breath with exertion.  Denies history of atrial fibrillation or irregular heartbeat  Vascular:  No history of rest pain in feet.  No history of claudication.  No history of non-healing ulcer, No history of DVT   Pulmonary: No home oxygen, no productive cough, no hemoptysis,  No asthma or wheezing  Musculoskeletal:  [ ]  Arthritis, [ ]  Low back pain,  [ ]  Joint pain  Hematologic:No history of hypercoagulable state.  No history of easy bleeding.  No history of anemia  Gastrointestinal: No hematochezia or melena,  No gastroesophageal reflux, no trouble swallowing  Urinary: [ ]  chronic Kidney disease, [ ]  on HD - [ ]  MWF or [ ]  TTHS, [ ]  Burning with urination, [ ]  Frequent urination, [ ]  Difficulty urinating;   Skin: No rashes  Psychological: No history of anxiety,  No history of depression   Physical Examination  Filed Vitals:    03/27/14 1017  BP: 150/85  Pulse: 79  Temp: 98.3 F (36.8 C)  TempSrc: Oral  Resp: 16  Height: 5' 6.5" (1.689 m)  Weight: 231 lb (104.781 kg)  SpO2: 99%    Body mass index is 36.73 kg/(m^2).  General:  Alert and oriented, no acute distress HEENT: Normal Neck: No bruit or JVD Pulmonary: Clear to auscultation bilaterally Cardiac: Regular Rate and Rhythm without murmur Abdomen: Soft, non-tender, non-distended, no mass Skin: No rash, bilateral medial calf varicosities diffusely over the greater and lesser saphenous vein 6-8 mm diameter Extremity Pulses:  2+ radial, brachial, femoral, dorsalis pedis, posterior tibial pulses bilaterally Musculoskeletal: No deformity or edema  Neurologic: Upper and lower extremity motor 5/5 and symmetric  DATA:  Patient had bilateral venous reflux exam today. I reviewed and interpreted this study. In the right lower extremity she had evidence of greater saphenous reflux with vein diameter 6-9 mm. She also had evidence of reflux in the lesser saphenous vein on the right side. Vein diameter up to 5 mm. She had no evidence of deep reflux in the right. In the left lower extremity she did have evidence of deep reflux in the common femoral superficial femoral and popliteal veins. She also had reflux in the left greater saphenous vein with vein diameter 5-9 mm. Lesser saphenous was competent in the left leg.  ASSESSMENT:  Bilateral symptomatic varicose veins left greater than right.   PLAN:  The patient was counseled today in pathophysiology of varicose veins. She was reassured she is not at risk of limb loss. She was reassured that this is more of a nuisance to her problem. She was given a prescription today for lower extremity thigh high compression stockings. She will followup in 3 months time for consideration for laser ablation of the left and possibly the right leg.  Ruta Hinds, MD [facility] [place] Office: [phone] Pager:  [phone]

## 2014-04-01 ENCOUNTER — Other Ambulatory Visit (HOSPITAL_COMMUNITY)
Admission: RE | Admit: 2014-04-01 | Discharge: 2014-04-01 | Disposition: A | Discharge status: Home / Self Care | Payer: BC Managed Care – PPO | Source: Ambulatory Visit | Attending: Adult Health | Admitting: Adult Health

## 2014-04-01 ENCOUNTER — Ambulatory Visit (INDEPENDENT_AMBULATORY_CARE_PROVIDER_SITE_OTHER): Payer: BC Managed Care – PPO | Admitting: Adult Health

## 2014-04-01 ENCOUNTER — Encounter: Payer: Self-pay | Admitting: Adult Health

## 2014-04-01 VITALS — BP 148/82 | HR 78 | Ht 66.0 in | Wt 234.5 lb

## 2014-04-01 DIAGNOSIS — Z1151 Encounter for screening for human papillomavirus (HPV): Secondary | ICD-10-CM | POA: Insufficient documentation

## 2014-04-01 DIAGNOSIS — D229 Melanocytic nevi, unspecified: Secondary | ICD-10-CM

## 2014-04-01 DIAGNOSIS — Z01419 Encounter for gynecological examination (general) (routine) without abnormal findings: Secondary | ICD-10-CM | POA: Diagnosis present

## 2014-04-01 DIAGNOSIS — Z1212 Encounter for screening for malignant neoplasm of rectum: Secondary | ICD-10-CM

## 2014-04-01 DIAGNOSIS — N898 Other specified noninflammatory disorders of vagina: Secondary | ICD-10-CM

## 2014-04-01 HISTORY — DX: Melanocytic nevi, unspecified: D22.9

## 2014-04-01 HISTORY — DX: Other specified noninflammatory disorders of vagina: N89.8

## 2014-04-01 LAB — HEMOCCULT GUIAC POC 1CARD (OFFICE): FECAL OCCULT BLD: NEGATIVE

## 2014-04-01 MED ORDER — NYSTATIN-TRIAMCINOLONE 100000-0.1 UNIT/GM-% EX OINT: 1.0000 "application " | TOPICAL_OINTMENT | Freq: Two times a day (BID) | CUTANEOUS | Status: DC

## 2014-04-01 NOTE — Progress Notes (Signed)
Patient ID: [name], female   DOB: Oct 21, 1950, 63 y.o.   MRN: [id number] History of Present Illness: Dream is a 63 year old white female, widowed, in for a pap and physical.She complains of vaginal dryness.   Current Medications, Allergies, Past Medical History, Past Surgical History, Family History and Social History were reviewed in [employer] record.     Review of Systems: Patient denies any headaches, blurred vision, shortness of breath, chest pain, abdominal pain, problems with bowel movements, urination, or intercourse. Not sexually active, has varicose veins, no mood swings.See HPI.    Physical Exam:BP 148/82  Pulse 78  Ht 5\' 6"  (1.676 m)  Wt 234 lb 8 oz (106.369 kg)  BMI 37.87 kg/m2 General:  Well developed, well nourished, no acute distress Skin:  Warm and dry Neck:  Midline trachea, normal thyroid, no carotid bruits Lungs; Clear to auscultation bilaterally Breast:  No dominant palpable mass, retraction, or nipple discharge Cardiovascular: Regular rate and rhythm Abdomen:  Soft, non tender, no hepatosplenomegaly Pelvic:  External genitalia is normal in appearance, except for black raised mile right labia.  The vagina is dry with pale tissue and loss of rugae. The cervix is atrophic, pap with HPV performed.  Uterus is felt to be normal size, shape, and contour.  No   adnexal masses or tenderness noted. Rectal: Good sphincter tone, no polyps, or hemorrhoids felt.  Hemoccult negative. Extremities:  No swelling, has  varicosities noted L>R, has seen surgery and needs to get support hose Psych:  No mood changes, alert and cooperative,seems happy   Impression: Well woman gyn exam Vaginal dryness Mole of concern right labia    Plan: Return in 3 weeks for mole removal Physical in 1 year Pap in 3 if negative HPV Labs with PCP Mammogram yearly Colonoscopy per GI Try luvena Refilled mytrex x 1

## 2014-04-01 NOTE — Patient Instructions (Addendum)
Physical in 1 year Pap in 3 years if negative HPV Mammogram yearly  Labs with PCP Get flu shot Return in 3 weeks for mole removal Try luvena

## 2014-04-02 LAB — CYTOLOGY - PAP

## 2014-04-21 ENCOUNTER — Ambulatory Visit (HOSPITAL_COMMUNITY)
Admission: RE | Admit: 2014-04-21 | Discharge: 2014-04-21 | Disposition: A | Discharge status: Home / Self Care | Payer: BC Managed Care – PPO | Source: Ambulatory Visit | Attending: Internal Medicine | Admitting: Internal Medicine

## 2014-04-21 DIAGNOSIS — Z1231 Encounter for screening mammogram for malignant neoplasm of breast: Secondary | ICD-10-CM | POA: Diagnosis present

## 2014-04-21 DIAGNOSIS — R928 Other abnormal and inconclusive findings on diagnostic imaging of breast: Secondary | ICD-10-CM | POA: Diagnosis not present

## 2014-04-22 ENCOUNTER — Ambulatory Visit: Payer: BC Managed Care – PPO | Admitting: Obstetrics & Gynecology

## 2014-04-23 ENCOUNTER — Other Ambulatory Visit: Payer: Self-pay | Admitting: Internal Medicine

## 2014-04-23 DIAGNOSIS — R928 Other abnormal and inconclusive findings on diagnostic imaging of breast: Secondary | ICD-10-CM

## 2014-04-28 ENCOUNTER — Encounter: Payer: Self-pay | Admitting: Obstetrics & Gynecology

## 2014-04-28 ENCOUNTER — Ambulatory Visit (INDEPENDENT_AMBULATORY_CARE_PROVIDER_SITE_OTHER): Payer: Medicare PPO | Admitting: Obstetrics & Gynecology

## 2014-04-28 ENCOUNTER — Other Ambulatory Visit: Payer: Self-pay | Admitting: Obstetrics & Gynecology

## 2014-04-28 VITALS — BP 130/80 | Ht 66.2 in | Wt 234.0 lb

## 2014-04-28 DIAGNOSIS — D229 Melanocytic nevi, unspecified: Secondary | ICD-10-CM

## 2014-04-28 DIAGNOSIS — D1809 Hemangioma of other sites: Secondary | ICD-10-CM

## 2014-04-28 NOTE — Progress Notes (Signed)
Patient ID: [name], female   DOB: 12-31-1950, 63 y.o.   MRN: [id number] Pt with are on the right vulva for the past year or two, was bothering her this summer  Exam Atypical nevus vs vascular origin  Area prepped 1% lidocaine injected Wide local excision performed to remove 1 cm lesion 4 sutures placed for closure and hemostasis Local care instructions given  Follow up prn Call if problem with pathology report

## 2014-04-28 NOTE — Addendum Note (Signed)
Addended by: Traci Sermon A on: 04/28/2014 04:20 PM   Modules accepted: Orders

## 2014-04-29 ENCOUNTER — Ambulatory Visit (HOSPITAL_COMMUNITY)
Admission: RE | Admit: 2014-04-29 | Discharge: 2014-04-29 | Disposition: A | Discharge status: Home / Self Care | Payer: Medicare PPO | Source: Ambulatory Visit | Attending: Internal Medicine | Admitting: Internal Medicine

## 2014-04-29 DIAGNOSIS — R928 Other abnormal and inconclusive findings on diagnostic imaging of breast: Secondary | ICD-10-CM | POA: Diagnosis present

## 2014-05-16 ENCOUNTER — Telehealth: Payer: Self-pay | Admitting: Obstetrics & Gynecology

## 2014-05-19 NOTE — Telephone Encounter (Signed)
Pt informed of WNL pathology results from 04/28/2014.

## 2014-07-22 ENCOUNTER — Ambulatory Visit: Payer: BC Managed Care – PPO | Admitting: Vascular Surgery

## 2015-07-07 ENCOUNTER — Other Ambulatory Visit (HOSPITAL_COMMUNITY): Payer: Self-pay | Admitting: Internal Medicine

## 2015-07-07 DIAGNOSIS — Z1231 Encounter for screening mammogram for malignant neoplasm of breast: Secondary | ICD-10-CM

## 2015-07-09 ENCOUNTER — Ambulatory Visit (HOSPITAL_COMMUNITY)
Admission: RE | Admit: 2015-07-09 | Discharge: 2015-07-09 | Disposition: A | Discharge status: Home / Self Care | Payer: Medicare Other | Source: Ambulatory Visit | Attending: Internal Medicine | Admitting: Internal Medicine

## 2015-07-09 DIAGNOSIS — Z1231 Encounter for screening mammogram for malignant neoplasm of breast: Secondary | ICD-10-CM | POA: Diagnosis not present

## 2016-07-15 ENCOUNTER — Other Ambulatory Visit (HOSPITAL_COMMUNITY): Payer: Self-pay | Admitting: Internal Medicine

## 2016-07-15 DIAGNOSIS — Z1231 Encounter for screening mammogram for malignant neoplasm of breast: Secondary | ICD-10-CM

## 2016-08-03 ENCOUNTER — Ambulatory Visit (HOSPITAL_COMMUNITY)
Admission: RE | Admit: 2016-08-03 | Discharge: 2016-08-03 | Disposition: A | Discharge status: Home / Self Care | Payer: Medicare Other | Source: Ambulatory Visit | Attending: Internal Medicine | Admitting: Internal Medicine

## 2016-08-03 ENCOUNTER — Encounter (HOSPITAL_COMMUNITY): Payer: Self-pay

## 2016-08-03 DIAGNOSIS — Z1231 Encounter for screening mammogram for malignant neoplasm of breast: Secondary | ICD-10-CM | POA: Diagnosis present

## 2016-12-02 ENCOUNTER — Ambulatory Visit (INDEPENDENT_AMBULATORY_CARE_PROVIDER_SITE_OTHER): Payer: Medicare Other | Admitting: Adult Health

## 2016-12-02 ENCOUNTER — Encounter: Payer: Self-pay | Admitting: Adult Health

## 2016-12-02 ENCOUNTER — Other Ambulatory Visit (HOSPITAL_COMMUNITY)
Admission: RE | Admit: 2016-12-02 | Discharge: 2016-12-02 | Disposition: A | Discharge status: Home / Self Care | Payer: Medicare Other | Source: Ambulatory Visit | Attending: Adult Health | Admitting: Adult Health

## 2016-12-02 VITALS — BP 140/80 | HR 72 | Ht 66.0 in | Wt 229.5 lb

## 2016-12-02 DIAGNOSIS — Z1211 Encounter for screening for malignant neoplasm of colon: Secondary | ICD-10-CM | POA: Insufficient documentation

## 2016-12-02 DIAGNOSIS — Z1212 Encounter for screening for malignant neoplasm of rectum: Secondary | ICD-10-CM | POA: Diagnosis not present

## 2016-12-02 DIAGNOSIS — Z01419 Encounter for gynecological examination (general) (routine) without abnormal findings: Secondary | ICD-10-CM | POA: Diagnosis not present

## 2016-12-02 DIAGNOSIS — L900 Lichen sclerosus et atrophicus: Secondary | ICD-10-CM | POA: Diagnosis not present

## 2016-12-02 DIAGNOSIS — Z124 Encounter for screening for malignant neoplasm of cervix: Secondary | ICD-10-CM | POA: Insufficient documentation

## 2016-12-02 LAB — HEMOCCULT GUIAC POC 1CARD (OFFICE): Fecal Occult Blood, POC: NEGATIVE

## 2016-12-02 MED ORDER — FLUCONAZOLE 100 MG PO TABS: ORAL_TABLET | ORAL | 0 refills | Status: DC

## 2016-12-02 NOTE — Progress Notes (Signed)
Patient ID: [name], female   DOB: 1951-04-16, 66 y.o.   MRN: [id number] History of Present Illness: [name] is a 66 year old white female in for well woman gyn exam and pap.She has dry area on bottom for about 6 months, no itching.  PCP is Dr Willey Blade.   Current Medications, Allergies, Past Medical History, Past Surgical History, Family History and Social History were reviewed in [employer] record.     Review of Systems: Patient denies any headaches, hearing loss, fatigue, blurred vision, shortness of breath, chest pain, abdominal pain, problems with bowel movements, urination, or intercourse(not having sex). No joint pain or mood swings. +dry area on bottom   Physical Exam:BP 140/80 (BP Location: Left Arm, Patient Position: Sitting, Cuff Size: Large)   Pulse 72   Ht 5\' 6"  (1.676 m)   Wt 229 lb 8 oz (104.1 kg)   BMI 37.04 kg/m  General:  Well developed, well nourished, no acute distress Skin:  Warm and dry Neck:  Midline trachea, normal thyroid, good ROM, no lymphadenopathy,no carotid bruits heard  Lungs; Clear to auscultation bilaterally Breast:  No dominant palpable mass, retraction, or nipple discharge Cardiovascular: Regular rate and rhythm Abdomen:  Soft, non tender, no hepatosplenomegaly Pelvic:  External genitalia is normal in appearance, except, there area from labia to rectum area that is dry and pale with demarcated edges, Dr Elonda Husky for co exam and area painted with gentian violet. And will Rx diflucan and recheck in 2 weeks(seems to be LSA) .  The vagina is pale and dry with loss of rugae. Urethra has no lesions or masses. The cervix is smooth, pap with HPV performed.   Uterus is felt to be normal size, shape, and contour.  No adnexal masses or tenderness noted.Bladder is non tender, no masses felt. Rectal: Good sphincter tone, no polyps, or hemorrhoids felt.  Hemoccult negative. Extremities/musculoskeletal:  No swelling or varicosities noted, no clubbing  or cyanosis Psych:  No mood changes, alert and cooperative,seems happy PHQ 2 score 0.She is aware can stop paps at 86, if desired.   Impression: 1. Encounter for routine gynecological examination with Papanicolaou smear of cervix   2. Routine cervical smear   3. Encounter for colorectal cancer screening   4. Lichen sclerosus et atrophicus       Plan:  Rx diflucan 100 mg #10 take 1 daily for 10 days Follow up in 2 weeks, if not better will add temovate Mammogram yearly Pap in 3 years if normal Labs with PCP Colonoscopy per GI

## 2016-12-06 LAB — CYTOLOGY - PAP: HPV (WINDOPATH): NOT DETECTED

## 2016-12-15 ENCOUNTER — Encounter: Payer: Self-pay | Admitting: Adult Health

## 2016-12-15 ENCOUNTER — Ambulatory Visit (INDEPENDENT_AMBULATORY_CARE_PROVIDER_SITE_OTHER): Payer: Medicare Other | Admitting: Adult Health

## 2016-12-15 VITALS — BP 120/78 | HR 88 | Ht 66.5 in | Wt 224.0 lb

## 2016-12-15 DIAGNOSIS — L900 Lichen sclerosus et atrophicus: Secondary | ICD-10-CM | POA: Diagnosis not present

## 2016-12-15 MED ORDER — NYSTATIN-TRIAMCINOLONE 100000-0.1 UNIT/GM-% EX OINT: 1.0000 "application " | TOPICAL_OINTMENT | Freq: Two times a day (BID) | CUTANEOUS | 1 refills | Status: DC

## 2016-12-15 MED ORDER — CLOBETASOL PROPIONATE 0.05 % EX CREA: TOPICAL_CREAM | CUTANEOUS | 3 refills | Status: DC

## 2016-12-15 NOTE — Progress Notes (Signed)
Subjective:     Patient ID: [name], female   DOB: [birth year], 66 y.o.   MRN: [id number]  HPI [name] is a 66 year old white female, back in follow up of "dry area on bottom", she doenst think it is any better, no itching.   Review of Systems Still dry, no itching on bottom Reviewed past medical,surgical, social and family history. Reviewed medications and allergies.     Objective:   Physical Exam BP 120/78 (BP Location: Right Arm, Patient Position: Sitting, Cuff Size: Normal)   Pulse 88   Ht 5' 6.5" (1.689 m)   Wt 224 lb (101.6 kg)   BMI 35.61 kg/m    Skin warm and dry, external genitalia is normal in appearance, except there is area from labia to rectum on both sides that is dry and pale with demarcated edges, no change since using diflucan and gentian violet.Will try temovate now and recheck in 6 weeks if not better, may need biopsy.  Assessment:     1. Lichen sclerosus et atrophicus       Plan:     Meds ordered this encounter  Medications  . nystatin-triamcinolone ointment (MYCOLOG)    Sig: Apply 1 application topically 2 (two) times daily.    Dispense:  30 g    Refill:  1    Order Specific Question:   Supervising Provider    Answer:   EURE, LUTHER H [[id number]]  . clobetasol cream (TEMOVATE) 0.05 %    Sig: Use bid x 2 weeks then 2-3 x weekly    Dispense:  30 g    Refill:  3    Order Specific Question:   Supervising Provider    Answer:   Tania Ade H [[id number]]  Follow up in 6 weeks for recheck

## 2017-01-26 ENCOUNTER — Ambulatory Visit (INDEPENDENT_AMBULATORY_CARE_PROVIDER_SITE_OTHER): Payer: Medicare Other | Admitting: Adult Health

## 2017-01-26 ENCOUNTER — Encounter: Payer: Self-pay | Admitting: Adult Health

## 2017-01-26 VITALS — BP 142/86 | HR 64 | Ht 66.0 in | Wt 230.0 lb

## 2017-01-26 DIAGNOSIS — L900 Lichen sclerosus et atrophicus: Secondary | ICD-10-CM

## 2017-01-26 NOTE — Progress Notes (Signed)
Subjective:     Patient ID: [name], female   DOB: Sep 09, 1950, 66 y.o.   MRN: [id number]  HPI Labella is a 66 year old white female back in follow up if dry area on bottom, she thinks it is better.  Review of Systems  Still dry,no itching, thinks it is better  Reviewed past medical,surgical, social and family history. Reviewed medications and allergies.     Objective:   Physical Exam BP (!) 142/86 (BP Location: Right Arm, Patient Position: Sitting, Cuff Size: Large)   Pulse 64   Ht 5\' 6"  (1.676 m)   Wt 230 lb (104.3 kg)   BMI 37.12 kg/m    Skin warm and dry, external genitalia is normal in appearance, except there is area from labia to rectum on both sides this dry and pale with some demarcated edges, but looks better than it did 6 weeks ago,, and there is a white area near clitoris now, use temovate there too, will continue temovate and recheck in 6 weeks. Assessment:   LSA    Plan:     Use temovate cream bid 3 x weekly  Follow up in 6 weeks

## 2017-01-26 NOTE — Patient Instructions (Signed)
Use temovate bid but only 3 x week now

## 2017-03-08 ENCOUNTER — Encounter: Payer: Self-pay | Admitting: Adult Health

## 2017-03-08 ENCOUNTER — Ambulatory Visit (INDEPENDENT_AMBULATORY_CARE_PROVIDER_SITE_OTHER): Payer: Medicare Other | Admitting: Adult Health

## 2017-03-08 VITALS — BP 120/78 | HR 78 | Ht 66.5 in | Wt 230.0 lb

## 2017-03-08 DIAGNOSIS — L900 Lichen sclerosus et atrophicus: Secondary | ICD-10-CM | POA: Diagnosis not present

## 2017-03-08 MED ORDER — NYSTATIN 100000 UNIT/GM EX OINT: 1.0000 "application " | TOPICAL_OINTMENT | Freq: Two times a day (BID) | CUTANEOUS | 0 refills | Status: DC

## 2017-03-08 NOTE — Progress Notes (Signed)
Subjective:     Patient ID: [name], female   DOB: Apr 14, 1951, 66 y.o.   MRN: [id number]  HPI [name] is a 66 year old white female, back in follow up of dry area on peri area.  Review of Systems  Vulva still the same, itches at times  Reviewed past medical,surgical, social and family history. Reviewed medications and allergies.     Objective:   Physical Exam BP 120/78 (BP Location: Left Arm, Patient Position: Sitting, Cuff Size: Large)   Pulse 78   Ht 5' 6.5" (1.689 m)   Wt 230 lb (104.3 kg)   BMI 36.57 kg/m    Skin warm and dry, there is an oval red demarcated area left labia,that is new to may visual, but pale areas from labia to rectum looks stable, and area near clitoris still pale and dry.She says that area has seemed to have been there before, Dr Glo Herring is in for co exam and suggests try nystatin bid for 2 weeks and recheck in 2 weeks and if still there, may needs biopsy.   Assessment:     LSA    Plan:     Meds ordered this encounter  Medications  . nystatin ointment (MYCOSTATIN)    Sig: Apply 1 application topically 2 (two) times daily.    Dispense:  30 g    Refill:  0    Order Specific Question:   Supervising Provider    Answer:   Tania Ade H [[id number]]  F/U in 2 weeks with Dr Glo Herring for possible biopsy

## 2017-03-08 NOTE — Patient Instructions (Signed)
Use nystatin ointment to affected area bid  F/U in 2 weeks with Dr Glo Herring

## 2017-03-22 ENCOUNTER — Ambulatory Visit: Payer: Medicare Other | Admitting: Obstetrics and Gynecology

## 2017-04-06 ENCOUNTER — Ambulatory Visit (INDEPENDENT_AMBULATORY_CARE_PROVIDER_SITE_OTHER): Payer: Medicare Other | Admitting: Obstetrics and Gynecology

## 2017-04-06 ENCOUNTER — Encounter: Payer: Self-pay | Admitting: Obstetrics and Gynecology

## 2017-04-06 VITALS — BP 132/80 | HR 72 | Ht 66.0 in | Wt 229.6 lb

## 2017-04-06 DIAGNOSIS — Z5329 Procedure and treatment not carried out because of patient's decision for other reasons: Secondary | ICD-10-CM

## 2017-04-09 NOTE — Progress Notes (Signed)
Missed appt

## 2017-04-17 ENCOUNTER — Ambulatory Visit: Payer: Medicare Other | Admitting: Obstetrics and Gynecology

## 2017-04-27 ENCOUNTER — Ambulatory Visit: Payer: Medicare Other | Admitting: Obstetrics and Gynecology

## 2017-04-27 VITALS — BP 136/80 | HR 98 | Ht 66.5 in | Wt 227.8 lb

## 2017-04-27 DIAGNOSIS — N904 Leukoplakia of vulva: Secondary | ICD-10-CM | POA: Insufficient documentation

## 2017-04-27 NOTE — Progress Notes (Signed)
[facility] Visit  04/27/2017            Patient name: [name] MRN [id number]  Date of birth: [birth year]  CC & HPI:  [name] is a 66 y.o. female presenting today for f/u of dry, itchy area on peri area, and for possible biopsy. She was last seen in office on 03/08/17 by NP Laurann Montana. She is using Clobetasol and Mycolog vaginal cream. She believes the area has gotten smaller since her last visit. She does not note any alleviating factors.    ROS:  ROS  +dry area on peri area -fever All systems are negative except as noted in the HPI and PMH.      Pertinent History Reviewed:   Reviewed: Significant for vaginal dryness Medical         Past Medical History:  Diagnosis Date  . Atypical nevi 04/01/2014  . Diabetes mellitus without complication ([place])   . Glaucoma   . Hypertension   . Vaginal dryness 04/01/2014  . Varicose veins                               Surgical Hx:    Past Surgical History:  Procedure Laterality Date  . APPENDECTOMY    . CESAREAN SECTION     X 3  . COLONOSCOPY    . COLONOSCOPY  07/23/2012   Procedure: COLONOSCOPY;  Surgeon: Daneil Dolin, MD;  Location: [facility];  Service: Endoscopy;  Laterality: N/A;  7:30 AM  . TONSILLECTOMY    . TUBAL LIGATION     Medications: Reviewed & Updated - see associated section                       Current Outpatient Prescriptions:  .  aspirin 81 MG tablet, Take 81 mg by mouth daily., Disp: , Rfl:  .  atorvastatin (LIPITOR) 10 MG tablet, Take 10 mg by mouth daily. , Disp: , Rfl:  .  brimonidine-timolol (COMBIGAN) 0.2-0.5 % ophthalmic solution, INSTILL ONE DROP IN BOTH  EYES TWO TIMES DAILY, Disp: , Rfl:  .  clobetasol cream (TEMOVATE) 0.05 %, Use bid x 2 weeks then 2-3 x weekly, Disp: 30 g, Rfl: 3 .  dorzolamide-timolol (COSOPT) 22.3-6.8 MG/ML ophthalmic solution, Place 1 drop into both eyes 2 (two) times daily., Disp: , Rfl:  .  Liraglutide (VICTOZA) 18 MG/3ML SOPN, Inject 1.2 mg into the skin daily.  , Disp: , Rfl:  .  lisinopril (PRINIVIL,ZESTRIL) 10 MG tablet, Take 20 mg by mouth daily. , Disp: , Rfl:  .  metFORMIN (GLUCOPHAGE) 1000 MG tablet, Take 1,000 mg by mouth 2 (two) times daily., Disp: , Rfl:  .  Multiple Vitamin (MULTIVITAMIN) tablet, Take 1 tablet by mouth daily., Disp: , Rfl:  .  nystatin ointment (MYCOSTATIN), Apply 1 application topically 2 (two) times daily., Disp: 30 g, Rfl: 0 .  nystatin-triamcinolone ointment (MYCOLOG), Apply 1 application topically 2 (two) times daily. (Patient taking differently: Apply 1 application topically. Every other day), Disp: 30 g, Rfl: 1   Social History: Reviewed -  reports that she has never smoked. She has never used smokeless tobacco.  Objective Findings:  Vitals: Blood pressure 136/80, pulse 98, height 5' 6.5" (1.689 m), weight 227 lb 12.8 oz (103.3 kg).  Physical Examination: General appearance - alert, well appearing, and in no distress Mental status - alert, oriented to person, place,  and time Pelvic - VULVA: Atrophic vaginal tissue change Biopsy not needed  Assessment & Plan:   A:  1. Lichen sclerosis and atrophicus,LSA  P:  1. Refill Clobetasol 2. Patient advised to try testosterone cream 2% in petrolatum from not and if this does better to switch to that and reserve the clobetasol 4 episodes where she is having distinct itching and burning 2. f/u in 6 months for GYN f/u    By signing my name below, I, [name], attest that this documentation has been prepared under the direction and in the presence of Paige Kind, MD. Electronically Signed: Jabier Hatfield, Medical Scribe. 04/27/17. 3:15 PM.  I personally performed the services described in this documentation, which was SCRIBED in my presence. The recorded information has been reviewed and considered accurate. It has been edited as necessary during review. Paige Kind, MD

## 2017-06-08 ENCOUNTER — Encounter: Payer: Self-pay | Admitting: Internal Medicine

## 2017-06-26 ENCOUNTER — Other Ambulatory Visit (HOSPITAL_COMMUNITY): Payer: Self-pay | Admitting: Internal Medicine

## 2017-06-26 DIAGNOSIS — Z1231 Encounter for screening mammogram for malignant neoplasm of breast: Secondary | ICD-10-CM

## 2017-07-11 ENCOUNTER — Ambulatory Visit (INDEPENDENT_AMBULATORY_CARE_PROVIDER_SITE_OTHER): Payer: Self-pay

## 2017-07-11 DIAGNOSIS — Z86010 Personal history of colonic polyps: Secondary | ICD-10-CM

## 2017-07-11 MED ORDER — PEG 3350-KCL-NA BICARB-NACL 420 G PO SOLR: 4000.0000 mL | ORAL | 0 refills | Status: DC

## 2017-07-11 NOTE — Progress Notes (Signed)
Gastroenterology Pre-Procedure Review  Request Date:07/11/17 Requesting Physician: Dr.Fagan  PATIENT REVIEW QUESTIONS: The patient responded to the following health history questions as indicated:    1. Diabetes Melitis: yes (taking metformin, victoza and amaryl) 2. Joint replacements in the past 12 months: no 3. Major health problems in the past 3 months: no 4. Has an artificial valve or MVP: no 5. Has a defibrillator: no 6. Has been advised in past to take antibiotics in advance of a procedure like teeth cleaning: no 7. Family history of colon cancer: yes (brother age 21)  99. Alcohol Use: no 9. History of sleep apnea: no  10. History of coronary artery or other vascular stents placed within the last 12 months: no 11. History of any prior anesthesia complications: no    MEDICATIONS & ALLERGIES:    Patient reports the following regarding taking any blood thinners:   Plavix? no Aspirin? yes (81mg ) Coumadin? no Brilinta? no Xarelto? no Eliquis? no Pradaxa? no Savaysa? no Effient? no  Patient confirms/reports the following medications:  Current Outpatient Medications  Medication Sig Dispense Refill  . aspirin 81 MG tablet Take 81 mg by mouth daily.    Marland Kitchen atorvastatin (LIPITOR) 10 MG tablet Take 10 mg by mouth daily.     . brimonidine-timolol (COMBIGAN) 0.2-0.5 % ophthalmic solution INSTILL ONE DROP IN BOTH  EYES TWO TIMES DAILY    . dorzolamide-timolol (COSOPT) 22.3-6.8 MG/ML ophthalmic solution Place 1 drop into both eyes 2 (two) times daily.    Marland Kitchen latanoprost (XALATAN) 0.005 % ophthalmic solution Place 1 drop into the left eye nightly.    . clobetasol cream (TEMOVATE) 0.05 % Use bid x 2 weeks then 2-3 x weekly 30 g 3  . Liraglutide (VICTOZA) 18 MG/3ML SOPN Inject 1.2 mg into the skin daily.     Marland Kitchen lisinopril (PRINIVIL,ZESTRIL) 10 MG tablet Take 20 mg by mouth daily.     . metFORMIN (GLUCOPHAGE) 1000 MG tablet Take 1,000 mg by mouth 2 (two) times daily.    . Multiple Vitamin  (MULTIVITAMIN) tablet Take 1 tablet by mouth daily.    Marland Kitchen nystatin ointment (MYCOSTATIN) Apply 1 application topically 2 (two) times daily. 30 g 0  . nystatin-triamcinolone ointment (MYCOLOG) Apply 1 application topically 2 (two) times daily. (Patient taking differently: Apply 1 application topically. Every other day) 30 g 1   No current facility-administered medications for this visit.     Patient confirms/reports the following allergies:  Allergies  Allergen Reactions  . Macrobid [Nitrofurantoin Macrocrystal] Nausea And Vomiting  . Codeine Nausea And Vomiting  . Sulfa Antibiotics Other (See Comments)    Pt cannot remember  . Ciprofloxacin     Decreased BP    No orders of the defined types were placed in this encounter.   AUTHORIZATION INFORMATION Primary Insurance: [employer],  ID #: [id number] Pre-Cert / Josem Kaufmann required: no    SCHEDULE INFORMATION: Procedure has been scheduled as follows:  Date: 08/23/17, Time: 8:30 Location: [facility]  This Gastroenterology Pre-Precedure Review Form is being routed to the following provider(s): Neil Crouch PA-C

## 2017-07-11 NOTE — Patient Instructions (Addendum)
[name]   [birth year] MRN: [id number]    Procedure Date: 09/27/17 Time to register: 8:15 Place to register: [facility] Procedure Time: 9:15 Scheduled provider: [place] WITH [facility] PREP  Please notify us immediately if you are diabetic, take iron supplements, or if you are on Coumadin or any other blood thinners.     You will need to purchase 1 fleet enema and 1 box of Bisacodyl '5mg'$  tablets.   2 DAYS BEFORE PROCEDURE:  DATE: 09/25/17   DAY: Monday Begin clear liquid diet AFTER your lunch meal. NO SOLID FOODS after this point.  1 DAY BEFORE PROCEDURE:  DATE: 09/26/17  DAY: Tuesday Continue clear liquids the entire day - NO SOLID FOOD.   Diabetic medications adjustments for today: take half dosage of your diabetic medications   At 2:00 pm:  Take 2 Bisacodyl tablets.   At 4:00pm:  Start drinking your solution. Make sure you mix well per instructions on the bottle. Try to drink 1 (one) 8 ounce glass every 10-15 minutes until you have consumed HALF the jug. You should complete by 6:00pm.You must keep the left over solution refrigerated until completed next day.  Continue clear liquids. You must drink plenty of clear liquids to prevent dehyration and kidney failure. Nothing to eat or drink after midnight.  EXCEPTION: If you take medications for your heart, blood pressure or breathing, you may take these medications with a small amount of clear liquid.    DAY OF PROCEDURE:   DATE: 09/27/17  DAY: Wednesday  Diabetic medications adjustments for today: do not take any diabetic medications this morning  Five hours before your procedure time @ 4:15am:  Finish remaining amout of bowel prep, drinking 1 (one) 8 ounce glass every 10-15 minutes until complete. You have two hours to consume remaining prep.   Three hours before your procedure time '@6'$ :15am:  Nothing by mouth.   At least one hour before going to the hospital:  Give yourself one Fleet  enema. You may take your morning medications with sip of water unless we have instructed otherwise.      Please see below for Dietary Information.  CLEAR LIQUIDS INCLUDE:  Water Jello (NOT red in color)   Ice Popsicles (NOT red in color)   Tea (sugar ok, no milk/cream) Powdered fruit flavored drinks  Coffee (sugar ok, no milk/cream) Gatorade/ Lemonade/ Kool-Aid  (NOT red in color)   Juice: apple, white grape, white cranberry Soft drinks  Clear bullion, consomme, broth (fat free beef/chicken/vegetable)  Carbonated beverages (any kind)  Strained chicken noodle soup Hard Candy   Remember: Clear liquids are liquids that will allow you to see your fingers on the other side of a clear glass. Be sure liquids are NOT red in color, and not cloudy, but CLEAR.  DO NOT EAT OR DRINK ANY OF THE FOLLOWING:  Dairy products of any kind   Cranberry juice Tomato juice / V8 juice   Grapefruit juice Orange juice     Red grape juice  Do not eat any solid foods, including such foods as: cereal, oatmeal, yogurt, fruits, vegetables, creamed soups, eggs, bread, crackers, pureed foods in a blender, etc.   HELPFUL HINTS FOR DRINKING PREP SOLUTION:   Make sure prep is extremely cold. Mix and refrigerate the the morning of the prep. You may also put in the freezer.   You may try mixing some Crystal Light or Country Time Lemonade if you prefer. Mix in small  amounts; add more if necessary.  Try drinking through a straw  Rinse mouth with water or a mouthwash between glasses, to remove after-taste.  Try sipping on a cold beverage /ice/ popsicles between glasses of prep.  Place a piece of sugar-free hard candy in mouth between glasses.  If you become nauseated, try consuming smaller amounts, or stretch out the time between glasses. Stop for 30-60 minutes, then slowly start back drinking.        OTHER INSTRUCTIONS  You will need a responsible adult at least 69 years of age to accompany you and drive  you home. This person must remain in the waiting room during your procedure. The hospital will cancel your procedure if you do not have a responsible adult with you.   1. Wear loose fitting clothing that is easily removed. 2. Leave jewelry and other valuables at home.  3. Remove all body piercing jewelry and leave at home. 4. Total time from sign-in until discharge is approximately 2-3 hours. 5. You should go home directly after your procedure and rest. You can resume normal activities the day after your procedure. 6. The day of your procedure you should not:  Drive  Make legal decisions  Operate machinery  Drink alcohol  Return to work   You may call the office (Dept: [phone]) before 5:00pm, or page the doctor on call [phone]) after 5:00pm, for further instructions, if necessary.   Insurance Information YOU WILL NEED TO CHECK WITH YOUR INSURANCE COMPANY FOR THE BENEFITS OF COVERAGE YOU HAVE FOR THIS PROCEDURE.  UNFORTUNATELY, NOT ALL INSURANCE COMPANIES HAVE BENEFITS TO COVER ALL OR PART OF THESE TYPES OF PROCEDURES.  IT IS YOUR RESPONSIBILITY TO CHECK YOUR BENEFITS, HOWEVER, WE WILL BE GLAD TO ASSIST YOU WITH ANY CODES YOUR INSURANCE COMPANY MAY NEED.    PLEASE NOTE THAT MOST INSURANCE COMPANIES WILL NOT COVER A SCREENING COLONOSCOPY FOR PEOPLE UNDER THE AGE OF 50  IF YOU HAVE BCBS INSURANCE, YOU MAY HAVE BENEFITS FOR A SCREENING COLONOSCOPY BUT IF POLYPS ARE FOUND THE DIAGNOSIS WILL CHANGE AND THEN YOU MAY HAVE A DEDUCTIBLE THAT WILL NEED TO BE MET. SO PLEASE MAKE SURE YOU CHECK YOUR BENEFITS FOR A SCREENING COLONOSCOPY AS WELL AS A DIAGNOSTIC COLONOSCOPY.

## 2017-07-11 NOTE — Progress Notes (Signed)
Ok to schedule.  Day before procedure: amaryl 1/2 tab am, metformin 500mg  bid, victoza 1/2 dose. AM of procedure: Hold amaryl, metformin, victoza.

## 2017-07-12 NOTE — Progress Notes (Signed)
Pt is aware of instructions.

## 2017-07-12 NOTE — Progress Notes (Signed)
Tried to call pt- NA- LMOM 

## 2017-08-01 ENCOUNTER — Telehealth: Payer: Self-pay | Admitting: Internal Medicine

## 2017-08-01 NOTE — Telephone Encounter (Signed)
Pt cancelled appt for 08/23/17 and moved it to 09/27/17.  I have changed it on our schedule and called and informed Hoyle Sauer. I have changed her instructions and mailed them to her. Pt did not have someone who could come with her on the 27th.

## 2017-08-01 NOTE — Progress Notes (Signed)
Pt called and changed date of tcs. I have changed instructions and mailed to her. See phone note. Hoyle Sauer is aware.

## 2017-08-01 NOTE — Telephone Encounter (Signed)
Pt is wanting to rescheduling her procedure on 2/27. Please call her at [phone]

## 2017-08-07 ENCOUNTER — Ambulatory Visit (HOSPITAL_COMMUNITY)
Admission: RE | Admit: 2017-08-07 | Discharge: 2017-08-07 | Disposition: A | Discharge status: Home / Self Care | Payer: Medicare Other | Source: Ambulatory Visit | Attending: Internal Medicine | Admitting: Internal Medicine

## 2017-08-07 DIAGNOSIS — Z1231 Encounter for screening mammogram for malignant neoplasm of breast: Secondary | ICD-10-CM | POA: Diagnosis present

## 2017-09-27 ENCOUNTER — Ambulatory Visit (HOSPITAL_COMMUNITY)
Admission: RE | Admit: 2017-09-27 | Discharge: 2017-09-27 | Disposition: A | Discharge status: Home / Self Care | Payer: Medicare Other | Source: Ambulatory Visit | Attending: Internal Medicine | Admitting: Internal Medicine

## 2017-09-27 ENCOUNTER — Encounter (HOSPITAL_COMMUNITY)
Admission: RE | Disposition: A | Discharge status: Home / Self Care | Payer: Self-pay | Source: Ambulatory Visit | Attending: Internal Medicine

## 2017-09-27 ENCOUNTER — Other Ambulatory Visit: Payer: Self-pay

## 2017-09-27 DIAGNOSIS — Z7984 Long term (current) use of oral hypoglycemic drugs: Secondary | ICD-10-CM | POA: Diagnosis not present

## 2017-09-27 DIAGNOSIS — Z86010 Personal history of colonic polyps: Secondary | ICD-10-CM | POA: Diagnosis not present

## 2017-09-27 DIAGNOSIS — Z800 Family history of malignant neoplasm of digestive organs: Secondary | ICD-10-CM | POA: Insufficient documentation

## 2017-09-27 DIAGNOSIS — E119 Type 2 diabetes mellitus without complications: Secondary | ICD-10-CM | POA: Insufficient documentation

## 2017-09-27 DIAGNOSIS — Z7982 Long term (current) use of aspirin: Secondary | ICD-10-CM | POA: Diagnosis not present

## 2017-09-27 DIAGNOSIS — I10 Essential (primary) hypertension: Secondary | ICD-10-CM | POA: Diagnosis not present

## 2017-09-27 DIAGNOSIS — K639 Disease of intestine, unspecified: Secondary | ICD-10-CM | POA: Diagnosis not present

## 2017-09-27 DIAGNOSIS — Z1211 Encounter for screening for malignant neoplasm of colon: Secondary | ICD-10-CM | POA: Diagnosis present

## 2017-09-27 HISTORY — PX: COLONOSCOPY: SHX5424

## 2017-09-27 HISTORY — PX: BIOPSY: SHX5522

## 2017-09-27 LAB — GLUCOSE, CAPILLARY: GLUCOSE-CAPILLARY: 179 mg/dL — AB (ref 65–99)

## 2017-09-27 SURGERY — COLONOSCOPY
Anesthesia: Moderate Sedation

## 2017-09-27 MED ORDER — MIDAZOLAM HCL 5 MG/5ML IJ SOLN
INTRAMUSCULAR | Status: DC | PRN
  Administered 2017-09-27 (×2): 1 mg via INTRAVENOUS
  Administered 2017-09-27: 2 mg via INTRAVENOUS

## 2017-09-27 MED ORDER — STERILE WATER FOR IRRIGATION IR SOLN
Status: DC | PRN
  Administered 2017-09-27: 09:00:00

## 2017-09-27 MED ORDER — MEPERIDINE HCL 100 MG/ML IJ SOLN
INTRAMUSCULAR | Status: AC
  Filled 2017-09-27: qty 2

## 2017-09-27 MED ORDER — MIDAZOLAM HCL 5 MG/5ML IJ SOLN
INTRAMUSCULAR | Status: AC
  Filled 2017-09-27: qty 10

## 2017-09-27 MED ORDER — SODIUM CHLORIDE 0.9 % IV SOLN
INTRAVENOUS | Status: DC
  Administered 2017-09-27: 09:00:00 via INTRAVENOUS

## 2017-09-27 MED ORDER — ONDANSETRON HCL 4 MG/2ML IJ SOLN
INTRAMUSCULAR | Status: DC | PRN
  Administered 2017-09-27: 4 mg via INTRAVENOUS

## 2017-09-27 MED ORDER — ONDANSETRON HCL 4 MG/2ML IJ SOLN
INTRAMUSCULAR | Status: AC
  Filled 2017-09-27: qty 2

## 2017-09-27 MED ORDER — MEPERIDINE HCL 100 MG/ML IJ SOLN
INTRAMUSCULAR | Status: DC | PRN
  Administered 2017-09-27: 50 mg via INTRAVENOUS

## 2017-09-27 NOTE — Discharge Instructions (Signed)
°  Colonoscopy Discharge Instructions  Read the instructions outlined below and refer to this sheet in the next few weeks. These discharge instructions provide you with general information on caring for yourself after you leave the hospital. Your doctor may also give you specific instructions. While your treatment has been planned according to the most current medical practices available, unavoidable complications occasionally occur. If you have any problems or questions after discharge, call Dr. Adelma Bowdoin at [phone]. ACTIVITY  You may resume your regular activity, but move at a slower pace for the next 24 hours.   Take frequent rest periods for the next 24 hours.   Walking will help get rid of the air and reduce the bloated feeling in your belly (abdomen).   No driving for 24 hours (because of the medicine (anesthesia) used during the test).    Do not sign any important legal documents or operate any machinery for 24 hours (because of the anesthesia used during the test).  NUTRITION  Drink plenty of fluids.   You may resume your normal diet as instructed by your doctor.   Begin with a light meal and progress to your normal diet. Heavy or fried foods are harder to digest and may make you feel sick to your stomach (nauseated).   Avoid alcoholic beverages for 24 hours or as instructed.  MEDICATIONS  You may resume your normal medications unless your doctor tells you otherwise.  WHAT YOU CAN EXPECT TODAY  Some feelings of bloating in the abdomen.   Passage of more gas than usual.   Spotting of blood in your stool or on the toilet paper.  IF YOU HAD POLYPS REMOVED DURING THE COLONOSCOPY:  No aspirin products for 7 days or as instructed.   No alcohol for 7 days or as instructed.   Eat a soft diet for the next 24 hours.  FINDING OUT THE RESULTS OF YOUR TEST Not all test results are available during your visit. If your test results are not back during the visit, make an appointment  with your caregiver to find out the results. Do not assume everything is normal if you have not heard from your caregiver or the medical facility. It is important for you to follow up on all of your test results.  SEEK IMMEDIATE MEDICAL ATTENTION IF:  You have more than a spotting of blood in your stool.   Your belly is swollen (abdominal distention).   You are nauseated or vomiting.   You have a temperature over 101.   You have abdominal pain or discomfort that is severe or gets worse throughout the day.   Further recommendations to follow pending review of pathology report 

## 2017-09-27 NOTE — H&P (Signed)
@LOGO @   Primary Care Physician:  Asencion Noble, MD Primary Gastroenterologist:  Dr. Gala Romney  Pre-Procedure History & Physical: HPI:  [name] is a 67 y.o. female here for surveillance colonoscopy. Personal history of colonic adenoma removed 5 years ago. Brother with colon cancer but at advanced age.  Past Medical History:  Diagnosis Date  . Atypical nevi 04/01/2014  . Diabetes mellitus without complication ([place])   . Glaucoma   . Hypertension   . Vaginal dryness 04/01/2014  . Varicose veins     Past Surgical History:  Procedure Laterality Date  . APPENDECTOMY    . CESAREAN SECTION     X 3  . COLONOSCOPY    . COLONOSCOPY  07/23/2012   Procedure: COLONOSCOPY;  Surgeon: Daneil Dolin, MD;  Location: [facility];  Service: Endoscopy;  Laterality: N/A;  7:30 AM  . TONSILLECTOMY    . TUBAL LIGATION      Prior to Admission medications   Medication Sig Start Date End Date Taking? Authorizing Provider  aspirin EC 81 MG tablet Take 81 mg by mouth at bedtime.   Yes [provider]  atorvastatin (LIPITOR) 10 MG tablet Take 10 mg by mouth at bedtime.    Yes [provider]  brimonidine-timolol (COMBIGAN) 0.2-0.5 % ophthalmic solution INSTILL ONE DROP IN BOTH  EYES TWO TIMES DAILY 10/28/16  Yes [provider]  cetirizine (ZYRTEC) 10 MG tablet Take 10 mg by mouth daily.   Yes [provider]  dorzolamide (TRUSOPT) 2 % ophthalmic solution Place 1 drop into both eyes 2 (two) times daily. 07/08/17  Yes [provider]  gabapentin (NEURONTIN) 100 MG capsule Take 200 mg by mouth at bedtime.   Yes [provider]  glimepiride (AMARYL) 1 MG tablet Take 1 mg by mouth daily before breakfast.    Yes [provider]  ibuprofen (ADVIL,MOTRIN) 200 MG tablet Take 200 mg by mouth every 8 (eight) hours as needed (for pain.).   Yes [provider]  latanoprost (XALATAN) 0.005 % ophthalmic solution Place 1 drop into both eyes 2 (two) times  daily.   Yes [provider]  Liraglutide (VICTOZA) 18 MG/3ML SOPN Inject 1.2 mg into the skin at bedtime.    Yes [provider]  lisinopril (PRINIVIL,ZESTRIL) 20 MG tablet Take 20 mg by mouth daily.   Yes [provider]  MAGNESIUM PO Take 1 tablet by mouth at bedtime.   Yes [provider]  metFORMIN (GLUCOPHAGE) 1000 MG tablet Take 1,000 mg by mouth 2 (two) times daily.   Yes [provider]  Multiple Vitamin (MULTIVITAMIN WITH MINERALS) TABS tablet Take 1 tablet by mouth at bedtime.   Yes [provider]  polyethylene glycol-electrolytes (TRILYTE) 420 g solution Take 4,000 mLs by mouth as directed. 07/11/17  Yes Mahala Menghini, PA-C    Allergies as of 07/11/2017 - Review Complete 07/11/2017  Allergen Reaction Noted  . Macrobid [nitrofurantoin macrocrystal] Nausea And Vomiting 06/28/2012  . Codeine Nausea And Vomiting 06/28/2012  . Sulfa antibiotics Other (See Comments) 06/28/2012  . Ciprofloxacin  07/23/2012    Family History  Problem Relation Age of Onset  . Varicose Veins Mother   . Deep vein thrombosis Mother   . Cancer Father   . Varicose Veins Father   . Deep vein thrombosis Father   . Diabetes Father   . Heart disease Father   . Hypertension Father   . Diabetes Sister   . Heart disease Sister   .  Hyperlipidemia Sister   . Hypertension Sister   . Hypertension Son   . Colon cancer Brother   . Cancer Brother        stomach  . Varicose Veins Daughter   . Hypertension Son   . Miscarriages / [place] Daughter     Social History   Socioeconomic History  . Marital status: Widowed    Spouse name: Not on file  . Number of children: Not on file  . Years of education: Not on file  . Highest education level: Not on file  Occupational History  . Not on file  Social Needs  . Financial resource strain: Not on file  . Food insecurity:    Worry: Not on file    Inability: Not on file  . Transportation needs:     Medical: Not on file    Non-medical: Not on file  Tobacco Use  . Smoking status: Never Smoker  . Smokeless tobacco: Never Used  Substance and Sexual Activity  . Alcohol use: No  . Drug use: No  . Sexual activity: Never    Birth control/protection: Post-menopausal  Lifestyle  . Physical activity:    Days per week: Not on file    Minutes per session: Not on file  . Stress: Not on file  Relationships  . Social connections:    Talks on phone: Not on file    Gets together: Not on file    Attends religious service: Not on file    Active member of club or organization: Not on file    Attends meetings of clubs or organizations: Not on file    Relationship status: Not on file  . Intimate partner violence:    Fear of current or ex partner: Not on file    Emotionally abused: Not on file    Physically abused: Not on file    Forced sexual activity: Not on file  Other Topics Concern  . Not on file  Social History Narrative  . Not on file    Review of Systems: See HPI, otherwise negative ROS  Physical Exam: BP 120/73   Pulse 69   Temp 97.7 F (36.5 C) (Oral)   Resp 14   Ht 5' 6.5" (1.689 m)   Wt 228 lb (103.4 kg)   SpO2 98%   BMI 36.25 kg/m  General:   Alert,  Well-developed, well-nourished, pleasant and cooperative in NAD Neck:  Supple; no masses or thyromegaly. No significant cervical adenopathy. Lungs:  Clear throughout to auscultation.   No wheezes, crackles, or rhonchi. No acute distress. Heart:  Regular rate and rhythm; no murmurs, clicks, rubs,  or gallops. Abdomen: Non-distended, normal bowel sounds.  Soft and nontender without appreciable mass or hepatosplenomegaly.  Pulses:  Normal pulses noted. Extremities:  Without clubbing or edema.  Impression:  Pleasant 67 year old lady with a history of colonic adenoma and a positive family history of colon cancer. Here for surveillance examination  Recommendations: I have offered the patient a surveillance colonoscopy  today.The risks, benefits, limitations, alternatives and imponderables have been reviewed with the patient. Questions have been answered. All parties are agreeable.      Notice: This dictation was prepared with Dragon dictation along with smaller phrase technology. Any transcriptional errors that result from this process are unintentional and may not be corrected upon review.

## 2017-09-27 NOTE — Op Note (Signed)
[facility] Patient Name: [name] Procedure Date: 09/27/2017 8:46 AM MRN: [id number] Date of Birth: 1950-11-06 Attending MD: Norvel Richards , MD CSN: [id number] Age: 67 Admit Type: Outpatient Procedure:                Colonoscopy Indications:              High risk colon cancer surveillance: Personal                            history of colonic polyps Providers:                Norvel Richards, MD, Otis Peak B. Gwenlyn Perking RN, RN,                            Aram Candela Referring MD:              Medicines:                Midazolam 4 mg IV, Meperidine 50 mg IV Complications:            No immediate complications. Estimated Blood Loss:     Estimated blood loss was minimal. Procedure:                Pre-Anesthesia Assessment:                           - Prior to the procedure, a History and Physical                            was performed, and patient medications and                            allergies were reviewed. The patient's tolerance of                            previous anesthesia was also reviewed. The risks                            and benefits of the procedure and the sedation                            options and risks were discussed with the patient.                            All questions were answered, and informed consent                            was obtained. Prior Anticoagulants: The patient has                            taken no previous anticoagulant or antiplatelet                            agents. ASA Grade Assessment: II - A patient with  mild systemic disease. After reviewing the risks                            and benefits, the patient was deemed in                            satisfactory condition to undergo the procedure.                           After obtaining informed consent, the colonoscope                            was passed under direct vision. Throughout the                            procedure, the  patient's blood pressure, pulse, and                            oxygen saturations were monitored continuously. The                            EC-[id number] ([id number]) scope was introduced through                            the anus and advanced to the the ileocecal valve.                            The colonoscopy was performed without difficulty.                            The patient tolerated the procedure well. The                            quality of the bowel preparation was adequate. The                            entire colon was well visualized. Scope In: 9:10:17 AM Scope Out: 9:27:23 AM Scope Withdrawal Time: 0 hours 12 minutes 34 seconds  Total Procedure Duration: 0 hours 17 minutes 6 seconds  Findings:      erythematous granular appearing ileocecal valve of uncertain       significance. This was biopsied with a cold forceps for histology.       Estimated blood loss was minimal.      The exam was otherwise without abnormality on direct and retroflexion       views. Impression:               - abnormal ileocecal valve uncertain significance..                            Biopsied.                           - The examination was otherwise normal on direct  and retroflexion views. Moderate Sedation:      Moderate (conscious) sedation was administered by the endoscopy nurse       and supervised by the endoscopist. The following parameters were       monitored: oxygen saturation, heart rate, blood pressure, respiratory       rate, EKG, adequacy of pulmonary ventilation, and response to care.       Total physician intraservice time was 21 minutes. Recommendation:           - Patient has a contact number available for                            emergencies. The signs and symptoms of potential                            delayed complications were discussed with the                            patient. Return to normal activities tomorrow.                             Written discharge instructions were provided to the                            patient.                           - Resume previous diet.                           - Continue present medications.                           - Repeat colonoscopy date to be determined after                            pending pathology results are reviewed for                            surveillance based on pathology results.                           - Return to GI office (date not yet determined). Procedure Code(s):        --- Professional ---                           [phone], Colonoscopy, flexible; with biopsy, single                            or multiple                           G0500, Moderate sedation services provided by the                            same physician or other qualified health care  professional performing a gastrointestinal                            endoscopic service that sedation supports,                            requiring the presence of an independent trained                            observer to assist in the monitoring of the                            patient's level of consciousness and physiological                            status; initial 15 minutes of intra-service time;                            patient age 21 years or older (additional time may                            be reported with [phone], as appropriate) Diagnosis Code(s):        --- Professional ---                           Z86.010, Personal history of colonic polyps CPT copyright 2017 [employer]. All rights reserved. The codes documented in this report are preliminary and upon coder review may  be revised to meet current compliance requirements. [name] [name], MD Norvel Richards, MD 09/27/2017 9:37:31 AM This report has been signed electronically. Number of Addenda: 0

## 2017-09-29 ENCOUNTER — Encounter: Payer: Self-pay | Admitting: Internal Medicine

## 2017-10-02 ENCOUNTER — Encounter (HOSPITAL_COMMUNITY): Payer: Self-pay | Admitting: Internal Medicine

## 2018-07-19 ENCOUNTER — Other Ambulatory Visit (HOSPITAL_COMMUNITY): Payer: Self-pay | Admitting: Internal Medicine

## 2018-07-19 DIAGNOSIS — Z1231 Encounter for screening mammogram for malignant neoplasm of breast: Secondary | ICD-10-CM

## 2018-08-20 ENCOUNTER — Ambulatory Visit (HOSPITAL_COMMUNITY)
Admission: RE | Admit: 2018-08-20 | Discharge: 2018-08-20 | Disposition: A | Discharge status: Home / Self Care | Payer: Medicare Other | Source: Ambulatory Visit | Attending: Internal Medicine | Admitting: Internal Medicine

## 2018-08-20 DIAGNOSIS — Z1231 Encounter for screening mammogram for malignant neoplasm of breast: Secondary | ICD-10-CM | POA: Diagnosis present

## 2018-09-03 ENCOUNTER — Ambulatory Visit (INDEPENDENT_AMBULATORY_CARE_PROVIDER_SITE_OTHER): Payer: Medicare Other | Admitting: Otolaryngology

## 2018-09-03 DIAGNOSIS — H903 Sensorineural hearing loss, bilateral: Secondary | ICD-10-CM | POA: Diagnosis not present

## 2018-09-03 DIAGNOSIS — H6121 Impacted cerumen, right ear: Secondary | ICD-10-CM | POA: Diagnosis not present

## 2019-07-17 ENCOUNTER — Ambulatory Visit: Payer: Medicare PPO | Attending: Internal Medicine

## 2019-07-17 DIAGNOSIS — Z23 Encounter for immunization: Secondary | ICD-10-CM

## 2019-07-17 NOTE — Progress Notes (Signed)
   Covid-19 Vaccination Clinic  Name:  [name]    MRN: [id number] DOB: May 22, 1951  07/17/2019  [name] was observed post Covid-19 immunization for 15 minutes without incidence. She was provided with Vaccine Information Sheet and instruction to access the V-Safe system.   [name] was instructed to call 911 with any severe reactions post vaccine: Marland Kitchen Difficulty breathing  . Swelling of your face and throat  . A fast heartbeat  . A bad rash all over your body  . Dizziness and weakness    Immunizations Administered    Name Date Dose VIS Date Route   Pfizer COVID-19 Vaccine 07/17/2019  6:13 PM 0.3 mL 06/07/2019 Intramuscular   Manufacturer: [place]   Lot: [id number]   [place]: [id number]

## 2019-07-26 ENCOUNTER — Other Ambulatory Visit (HOSPITAL_COMMUNITY): Payer: Self-pay | Admitting: Internal Medicine

## 2019-07-26 DIAGNOSIS — Z1231 Encounter for screening mammogram for malignant neoplasm of breast: Secondary | ICD-10-CM

## 2019-08-04 ENCOUNTER — Encounter (HOSPITAL_COMMUNITY): Payer: Self-pay | Admitting: Emergency Medicine

## 2019-08-04 ENCOUNTER — Emergency Department (HOSPITAL_COMMUNITY)
Admission: EM | Admit: 2019-08-04 | Discharge: 2019-08-04 | Disposition: A | Discharge status: Home / Self Care | Payer: Medicare PPO | Attending: Emergency Medicine | Admitting: Emergency Medicine

## 2019-08-04 ENCOUNTER — Other Ambulatory Visit: Payer: Self-pay

## 2019-08-04 ENCOUNTER — Emergency Department (HOSPITAL_COMMUNITY): Payer: Medicare PPO

## 2019-08-04 DIAGNOSIS — S42291A Other displaced fracture of upper end of right humerus, initial encounter for closed fracture: Secondary | ICD-10-CM | POA: Insufficient documentation

## 2019-08-04 DIAGNOSIS — Y9389 Activity, other specified: Secondary | ICD-10-CM | POA: Insufficient documentation

## 2019-08-04 DIAGNOSIS — I10 Essential (primary) hypertension: Secondary | ICD-10-CM | POA: Diagnosis not present

## 2019-08-04 DIAGNOSIS — Y999 Unspecified external cause status: Secondary | ICD-10-CM | POA: Insufficient documentation

## 2019-08-04 DIAGNOSIS — Z79899 Other long term (current) drug therapy: Secondary | ICD-10-CM | POA: Diagnosis not present

## 2019-08-04 DIAGNOSIS — W01198A Fall on same level from slipping, tripping and stumbling with subsequent striking against other object, initial encounter: Secondary | ICD-10-CM | POA: Diagnosis not present

## 2019-08-04 DIAGNOSIS — Z7984 Long term (current) use of oral hypoglycemic drugs: Secondary | ICD-10-CM | POA: Insufficient documentation

## 2019-08-04 DIAGNOSIS — Y9289 Other specified places as the place of occurrence of the external cause: Secondary | ICD-10-CM | POA: Diagnosis not present

## 2019-08-04 DIAGNOSIS — Z7982 Long term (current) use of aspirin: Secondary | ICD-10-CM | POA: Insufficient documentation

## 2019-08-04 DIAGNOSIS — W19XXXA Unspecified fall, initial encounter: Secondary | ICD-10-CM

## 2019-08-04 DIAGNOSIS — S4991XA Unspecified injury of right shoulder and upper arm, initial encounter: Secondary | ICD-10-CM | POA: Diagnosis present

## 2019-08-04 DIAGNOSIS — M25511 Pain in right shoulder: Secondary | ICD-10-CM | POA: Diagnosis not present

## 2019-08-04 DIAGNOSIS — E119 Type 2 diabetes mellitus without complications: Secondary | ICD-10-CM | POA: Insufficient documentation

## 2019-08-04 MED ORDER — HYDROCODONE-ACETAMINOPHEN 5-325 MG PO TABS
1.0000 | ORAL_TABLET | Freq: Once | ORAL | Status: AC
  Administered 2019-08-04: 1 via ORAL
  Filled 2019-08-04: qty 1

## 2019-08-04 MED ORDER — HYDROCODONE-ACETAMINOPHEN 5-325 MG PO TABS: 1.0000 | ORAL_TABLET | Freq: Four times a day (QID) | ORAL | 0 refills | Status: DC | PRN

## 2019-08-04 MED ORDER — ONDANSETRON 4 MG PO TBDP: 4.0000 mg | ORAL_TABLET | Freq: Three times a day (TID) | ORAL | 1 refills | Status: DC | PRN

## 2019-08-04 MED ORDER — ONDANSETRON 4 MG PO TBDP
4.0000 mg | ORAL_TABLET | Freq: Once | ORAL | Status: AC
  Administered 2019-08-04: 11:00:00 4 mg via ORAL
  Filled 2019-08-04: qty 1

## 2019-08-04 NOTE — Discharge Instructions (Addendum)
Take the Zofran for nausea.  Take the hydrocodone as needed for pain.  Wear the shoulder sling as much as possible.  Make an appointment to follow-up with orthopedics referral information to Dr. Aline Brochure here locally in [place] provided.

## 2019-08-04 NOTE — ED Triage Notes (Signed)
Pt reports rushing to get out of bed to answer the phone and fell. C/o right shoulder pain.

## 2019-08-04 NOTE — ED Provider Notes (Addendum)
[facility] EMERGENCY DEPARTMENT Provider Note   CSN: [id number] Arrival date & time: 08/04/19  [id number]     History Chief Complaint  Patient presents with  . Fall    [name] is a 69 y.o. female.  Patient had a fall at home while trying to get to the phone.  Complained of pain to right shoulder.  Her son brought her in.  Patient also with abrasions to left hand.  Denies any other injuries no loss of consciousness no back pain no hip pain no lower extremity pain.  No neck pain.  Occurred just prior to arrival.        Past Medical History:  Diagnosis Date  . Atypical nevi 04/01/2014  . Diabetes mellitus without complication ([place])   . Glaucoma   . Hypertension   . Vaginal dryness 04/01/2014  . Varicose veins     Patient Active Problem List   Diagnosis Date Noted  . Vulvar dystrophy 04/27/2017  . Lichen sclerosus et atrophicus 12/02/2016  . Encounter for colorectal cancer screening 12/02/2016  . Vaginal dryness 04/01/2014  . Atypical nevi 04/01/2014  . Varicose veins of leg with pain 03/27/2014    Past Surgical History:  Procedure Laterality Date  . APPENDECTOMY    . BIOPSY  09/27/2017   Procedure: BIOPSY;  Surgeon: Daneil Dolin, MD;  [facility];  Service: Endoscopy;;  ileocecal valve  . CESAREAN SECTION     X 3  . COLONOSCOPY    . COLONOSCOPY  07/23/2012   Procedure: COLONOSCOPY;  Surgeon: Daneil Dolin, MD;  [facility];  Service: Endoscopy;  Laterality: N/A;  7:30 AM  . COLONOSCOPY N/A 09/27/2017   Procedure: COLONOSCOPY;  Surgeon: Daneil Dolin, MD;  [facility];  Service: Endoscopy;  Laterality: N/A;  8:30  . TONSILLECTOMY    . TUBAL LIGATION       OB History    Gravida  4   Para  4   Term  3   Preterm  1   AB      Living  3     SAB      TAB      Ectopic      Multiple      Live Births  3           Family History  Problem Relation Age of Onset  . Varicose Veins Mother   . Deep vein  thrombosis Mother   . Cancer Father   . Varicose Veins Father   . Deep vein thrombosis Father   . Diabetes Father   . Heart disease Father   . Hypertension Father   . Diabetes Sister   . Heart disease Sister   . Hyperlipidemia Sister   . Hypertension Sister   . Hypertension Son   . Colon cancer Brother   . Cancer Brother        stomach  . Varicose Veins Daughter   . Hypertension Son   . Miscarriages / [place] Daughter     Social History   Tobacco Use  . Smoking status: Never Smoker  . Smokeless tobacco: Never Used  Substance Use Topics  . Alcohol use: No  . Drug use: No    Home Medications Prior to Admission medications   Medication Sig Start Date End Date Taking? Authorizing Provider  amLODipine (NORVASC) 2.5 MG tablet Take 2.5 mg by mouth daily. 07/29/19   [provider]  aspirin EC 81  MG tablet Take 81 mg by mouth at bedtime.    [provider]  atorvastatin (LIPITOR) 10 MG tablet Take 10 mg by mouth at bedtime.     [provider]  brimonidine-timolol (COMBIGAN) 0.2-0.5 % ophthalmic solution INSTILL ONE DROP IN BOTH  EYES TWO TIMES DAILY 10/28/16   [provider]  cetirizine (ZYRTEC) 10 MG tablet Take 10 mg by mouth daily.    [provider]  dorzolamide (TRUSOPT) 2 % ophthalmic solution Place 1 drop into both eyes 2 (two) times daily. 07/08/17   [provider]  gabapentin (NEURONTIN) 100 MG capsule Take 200 mg by mouth at bedtime.    [provider]  gabapentin (NEURONTIN) 300 MG capsule Take 1 capsule by mouth daily as needed. 07/15/19   [provider]  glimepiride (AMARYL) 1 MG tablet Take 1 mg by mouth daily before breakfast.     [provider]  HYDROcodone-acetaminophen (NORCO/VICODIN) 5-325 MG tablet Take 1 tablet by mouth every 6 (six) hours as needed for moderate pain. 08/04/19   Fredia Sorrow, MD  ibuprofen (ADVIL,MOTRIN) 200 MG tablet Take 200 mg by mouth every 8 (eight) hours  as needed (for pain.).    [provider]  latanoprost (XALATAN) 0.005 % ophthalmic solution Place 1 drop into both eyes 2 (two) times daily.    [provider]  Liraglutide (VICTOZA) 18 MG/3ML SOPN Inject 1.2 mg into the skin at bedtime.     [provider]  lisinopril (PRINIVIL,ZESTRIL) 20 MG tablet Take 20 mg by mouth daily.    [provider]  losartan (COZAAR) 100 MG tablet Take 100 mg by mouth daily. 07/15/19   [provider]  MAGNESIUM PO Take 1 tablet by mouth at bedtime.    [provider]  metFORMIN (GLUCOPHAGE) 1000 MG tablet Take 1,000 mg by mouth 2 (two) times daily.    [provider]  Multiple Vitamin (MULTIVITAMIN WITH MINERALS) TABS tablet Take 1 tablet by mouth at bedtime.    [provider]  ondansetron (ZOFRAN ODT) 4 MG disintegrating tablet Take 1 tablet (4 mg total) by mouth every 8 (eight) hours as needed. 08/04/19   Fredia Sorrow, MD    Allergies    Macrobid [nitrofurantoin macrocrystal], Codeine, Sulfa antibiotics, and Ciprofloxacin  Review of Systems   Review of Systems  Constitutional: Negative for chills and fever.  HENT: Negative for congestion, rhinorrhea and sore throat.   Eyes: Negative for visual disturbance.  Respiratory: Negative for cough and shortness of breath.   Cardiovascular: Negative for chest pain and leg swelling.  Gastrointestinal: Negative for abdominal pain, diarrhea, nausea and vomiting.  Genitourinary: Negative for dysuria.  Musculoskeletal: Negative for back pain and neck pain.  Skin: Negative for rash.  Neurological: Negative for dizziness, weakness, light-headedness, numbness and headaches.  Hematological: Does not bruise/bleed easily.  Psychiatric/Behavioral: Negative for confusion.    Physical Exam Updated Vital Signs BP (!) 154/107 (BP Location: Left Arm)   Pulse 68   Temp 98.2 F (36.8 C) (Oral)   Resp 18   Ht 1.676 m (5\' 6" )   Wt 99.3 kg   SpO2 100%    BMI 35.35 kg/m   Physical Exam Vitals and nursing note reviewed.  Constitutional:      General: She is not in acute distress.    Appearance: Normal appearance. She is well-developed.  HENT:     Head: Normocephalic and atraumatic.  Eyes:     Extraocular Movements: Extraocular movements intact.  Conjunctiva/sclera: Conjunctivae normal.     Pupils: Pupils are equal, round, and reactive to light.  Cardiovascular:     Rate and Rhythm: Normal rate and regular rhythm.     Heart sounds: No murmur.  Pulmonary:     Effort: Pulmonary effort is normal. No respiratory distress.     Breath sounds: Normal breath sounds.  Abdominal:     Palpations: Abdomen is soft.     Tenderness: There is no abdominal tenderness.  Musculoskeletal:        General: Tenderness present. Normal range of motion.     Cervical back: Normal range of motion and neck supple.     Comments: No deformity but tenderness to palpation proximal right humerus.  Elbow nontender wrist hand nontender.  Some abrasions to the ulnar aspect of the left hand.  Distally neurovascularly intact on both hands.  Skin:    General: Skin is warm and dry.     Capillary Refill: Capillary refill takes less than 2 seconds.  Neurological:     General: No focal deficit present.     Mental Status: She is alert and oriented to person, place, and time.     Cranial Nerves: No cranial nerve deficit.     Sensory: No sensory deficit.     Motor: No weakness.     ED Results / Procedures / Treatments   Labs (all labs ordered are listed, but only abnormal results are displayed) Labs Reviewed - No data to display  EKG None  Radiology DG Shoulder Right  Result Date: 08/04/2019 CLINICAL DATA:  Right shoulder pain after fall. EXAM: RIGHT SHOULDER - 2+ VIEW COMPARISON:  None. FINDINGS: There appears to be a mildly displaced fracture involving the greater tuberosity of the proximal humerus. There is no evidence of arthropathy. Soft tissues are  unremarkable. IMPRESSION: Mildly displaced fracture involving greater tuberosity of proximal humerus. Electronically Signed   By: Marijo Conception M.D.   On: 08/04/2019 09:44    Procedures Procedures (including critical care time)  Medications Ordered in ED Medications  ondansetron (ZOFRAN-ODT) disintegrating tablet 4 mg (has no administration in time range)  HYDROcodone-acetaminophen (NORCO/VICODIN) 5-325 MG per tablet 1 tablet (has no administration in time range)    ED Course  I have reviewed the triage vital signs and the nursing notes.  Pertinent labs & imaging results that were available during my care of the patient were reviewed by me and considered in my medical decision making (see chart for details).    MDM Rules/Calculators/A&P                        X-ray shows displaced greater tuberosity fracture of the proximal right humerus.  Patient be treated with sling pain medicine has trouble with nausea with codeine type medicines so will be given Zofran as well.  Follow-up with orthopedics.  Patient wanted to be followed in [place] also given information on Dr. Aline Brochure.  She will call and make an appointment.  Patient without any other injuries.  Final Clinical Impression(s) / ED Diagnoses Final diagnoses:  Fall, initial encounter  Other closed displaced fracture of proximal end of right humerus, initial encounter    Rx / DC Orders ED Discharge Orders         Ordered    ondansetron (ZOFRAN ODT) 4 MG disintegrating tablet  Every 8 hours PRN     08/04/19 1019    HYDROcodone-acetaminophen (NORCO/VICODIN) 5-325 MG tablet  Every 6 hours PRN  08/04/19 1019           Fredia Sorrow, MD 08/04/19 1023    Fredia Sorrow, MD 08/04/19 1024

## 2019-08-05 ENCOUNTER — Ambulatory Visit: Payer: Medicare PPO | Attending: Internal Medicine

## 2019-08-05 DIAGNOSIS — Z23 Encounter for immunization: Secondary | ICD-10-CM

## 2019-08-05 NOTE — Progress Notes (Signed)
   Covid-19 Vaccination Clinic  Name:  RAJVI LIUZZO    MRN: [id number] DOB: [birth year]  08/05/2019  Ms. Mclinn was observed post Covid-19 immunization for 15 minutes without incidence. She was provided with Vaccine Information Sheet and instruction to access the V-Safe system.   Ms. Artuso was instructed to call 911 with any severe reactions post vaccine: Marland Kitchen Difficulty breathing  . Swelling of your face and throat  . A fast heartbeat  . A bad rash all over your body  . Dizziness and weakness    Immunizations Administered    Name Date Dose VIS Date Route   Pfizer COVID-19 Vaccine 08/05/2019 10:59 AM 0.3 mL 06/07/2019 Intramuscular   Manufacturer: [place]   Lot: [id number]   [place]: [id number]

## 2019-08-06 ENCOUNTER — Other Ambulatory Visit: Payer: Self-pay

## 2019-08-06 ENCOUNTER — Ambulatory Visit
Admission: RE | Admit: 2019-08-06 | Discharge: 2019-08-06 | Disposition: A | Discharge status: Home / Self Care | Payer: Medicare PPO | Source: Ambulatory Visit | Attending: Orthopaedic Surgery | Admitting: Orthopaedic Surgery

## 2019-08-06 ENCOUNTER — Other Ambulatory Visit: Payer: Self-pay | Admitting: Orthopaedic Surgery

## 2019-08-06 ENCOUNTER — Encounter (HOSPITAL_BASED_OUTPATIENT_CLINIC_OR_DEPARTMENT_OTHER): Payer: Self-pay | Admitting: Orthopaedic Surgery

## 2019-08-06 ENCOUNTER — Other Ambulatory Visit (HOSPITAL_COMMUNITY)
Admission: RE | Admit: 2019-08-06 | Discharge: 2019-08-06 | Disposition: A | Discharge status: Home / Self Care | Payer: Medicare PPO | Source: Ambulatory Visit | Attending: Orthopaedic Surgery | Admitting: Orthopaedic Surgery

## 2019-08-06 ENCOUNTER — Encounter (HOSPITAL_BASED_OUTPATIENT_CLINIC_OR_DEPARTMENT_OTHER)
Admission: RE | Admit: 2019-08-06 | Discharge: 2019-08-06 | Disposition: A | Discharge status: Home / Self Care | Payer: Medicare PPO | Source: Ambulatory Visit | Attending: Orthopaedic Surgery | Admitting: Orthopaedic Surgery

## 2019-08-06 DIAGNOSIS — Z20822 Contact with and (suspected) exposure to covid-19: Secondary | ICD-10-CM | POA: Insufficient documentation

## 2019-08-06 DIAGNOSIS — M25511 Pain in right shoulder: Secondary | ICD-10-CM

## 2019-08-06 DIAGNOSIS — Z01812 Encounter for preprocedural laboratory examination: Secondary | ICD-10-CM | POA: Insufficient documentation

## 2019-08-06 DIAGNOSIS — S42291A Other displaced fracture of upper end of right humerus, initial encounter for closed fracture: Secondary | ICD-10-CM | POA: Diagnosis not present

## 2019-08-06 LAB — BASIC METABOLIC PANEL
Anion gap: 8 (ref 5–15)
BUN: 14 mg/dL (ref 8–23)
CO2: 28 mmol/L (ref 22–32)
Calcium: 9.2 mg/dL (ref 8.9–10.3)
Chloride: 103 mmol/L (ref 98–111)
Creatinine, Ser: 0.88 mg/dL (ref 0.44–1.00)
GFR calc Af Amer: >60 mL/min (ref >60)
GFR calc non Af Amer: >60 mL/min (ref >60)
Glucose, Bld: 180 mg/dL — ABNORMAL HIGH (ref 70–99)
Potassium: 3.9 mmol/L (ref 3.5–5.1)
Sodium: 139 mmol/L (ref 135–145)

## 2019-08-06 LAB — SARS CORONAVIRUS 2 (TAT 6-24 HRS): SARS Coronavirus 2: NEGATIVE

## 2019-08-06 NOTE — H&P (Signed)
PREOPERATIVE H&P  Chief Complaint: RIGHT PROXIMAL HUMERUS FRACTURE  HPI: [name] is a 69 y.o. female who is scheduled for OPEN REDUCTION INTERNAL FIXATION (ORIF) RIGHT  PROXIMAL HUMERUS FRACTURE.   Patient has a past medical history significant for DM and HTN.   Patient had a fall onto her right shoulder on 08/04/2019. She felt immediate pain in her right shoulder. She went to [facility] Emergency Department. X-rays showed displaced greater tuberosity fracture of the proximal right humerus.  Patient was placed in a sling and instructed to follow-up with up orthopedics. Today, she notes she has been managing her pain with Ibuprofen and Tylenol. She is right hand dominant.   Her symptoms are rated as moderate to severe, and have been worsening.  This is significantly impairing activities of daily living.    Please see clinic note for further details on this patient's care.    She has elected for surgical management.   Past Medical History:  Diagnosis Date  . Atypical nevi 04/01/2014  . Diabetes mellitus without complication ([place])   . Glaucoma   . Hypertension   . Vaginal dryness 04/01/2014  . Varicose veins    Past Surgical History:  Procedure Laterality Date  . APPENDECTOMY    . BIOPSY  09/27/2017   Procedure: BIOPSY;  Surgeon: Daneil Dolin, MD;  [facility];  Service: Endoscopy;;  ileocecal valve  . CESAREAN SECTION     X 3  . COLONOSCOPY    . COLONOSCOPY  07/23/2012   Procedure: COLONOSCOPY;  Surgeon: Daneil Dolin, MD;  [facility];  Service: Endoscopy;  Laterality: N/A;  7:30 AM  . COLONOSCOPY N/A 09/27/2017   Procedure: COLONOSCOPY;  Surgeon: Daneil Dolin, MD;  [facility];  Service: Endoscopy;  Laterality: N/A;  8:30  . TONSILLECTOMY    . TUBAL LIGATION     Social History   Socioeconomic History  . Marital status: Widowed    Spouse name: Not on file  . Number of children: Not on file  . Years of education: Not on file    . Highest education level: Not on file  Occupational History  . Not on file  Tobacco Use  . Smoking status: Never Smoker  . Smokeless tobacco: Never Used  Substance and Sexual Activity  . Alcohol use: No  . Drug use: No  . Sexual activity: Never    Birth control/protection: Post-menopausal  Other Topics Concern  . Not on file  Social History Narrative  . Not on file   Social Determinants of Health   Financial Resource Strain:   . Difficulty of Paying Living Expenses: Not on file  Food Insecurity:   . Worried About Charity fundraiser in the Last Year: Not on file  . Ran Out of Food in the Last Year: Not on file  Transportation Needs:   . Lack of Transportation (Medical): Not on file  . Lack of Transportation (Non-Medical): Not on file  Physical Activity:   . Days of Exercise per Week: Not on file  . Minutes of Exercise per Session: Not on file  Stress:   . Feeling of Stress : Not on file  Social Connections:   . Frequency of Communication with Friends and Family: Not on file  . Frequency of Social Gatherings with Friends and Family: Not on file  . Attends Religious Services: Not on file  . Active Member of Clubs or Organizations: Not on file  .  Attends Archivist Meetings: Not on file  . Marital Status: Not on file   Family History  Problem Relation Age of Onset  . Varicose Veins Mother   . Deep vein thrombosis Mother   . Cancer Father   . Varicose Veins Father   . Deep vein thrombosis Father   . Diabetes Father   . Heart disease Father   . Hypertension Father   . Diabetes Sister   . Heart disease Sister   . Hyperlipidemia Sister   . Hypertension Sister   . Hypertension Son   . Colon cancer Brother   . Cancer Brother        stomach  . Varicose Veins Daughter   . Hypertension Son   . Miscarriages / Stillbirths Daughter    Allergies  Allergen Reactions  . Macrobid [Nitrofurantoin Macrocrystal] Nausea And Vomiting  . Codeine Nausea And Vomiting   . Sulfa Antibiotics Other (See Comments)    Pt cannot remember  . Ciprofloxacin     Decreased BP   Prior to Admission medications   Medication Sig Start Date End Date Taking? Authorizing Provider  amLODipine (NORVASC) 2.5 MG tablet Take 2.5 mg by mouth daily. 07/29/19   [provider]  aspirin EC 81 MG tablet Take 81 mg by mouth at bedtime.    [provider]  atorvastatin (LIPITOR) 10 MG tablet Take 10 mg by mouth at bedtime.     [provider]  brimonidine-timolol (COMBIGAN) 0.2-0.5 % ophthalmic solution INSTILL ONE DROP IN BOTH  EYES TWO TIMES DAILY 10/28/16   [provider]  cetirizine (ZYRTEC) 10 MG tablet Take 10 mg by mouth daily.    [provider]  dorzolamide (TRUSOPT) 2 % ophthalmic solution Place 1 drop into both eyes 2 (two) times daily. 07/08/17   [provider]  gabapentin (NEURONTIN) 100 MG capsule Take 200 mg by mouth at bedtime.    [provider]  gabapentin (NEURONTIN) 300 MG capsule Take 1 capsule by mouth daily as needed. 07/15/19   [provider]  glimepiride (AMARYL) 1 MG tablet Take 1 mg by mouth daily before breakfast.     [provider]  HYDROcodone-acetaminophen (NORCO/VICODIN) 5-325 MG tablet Take 1 tablet by mouth every 6 (six) hours as needed for moderate pain. 08/04/19   Fredia Sorrow, MD  ibuprofen (ADVIL,MOTRIN) 200 MG tablet Take 200 mg by mouth every 8 (eight) hours as needed (for pain.).    [provider]  latanoprost (XALATAN) 0.005 % ophthalmic solution Place 1 drop into both eyes 2 (two) times daily.    [provider]  Liraglutide (VICTOZA) 18 MG/3ML SOPN Inject 1.2 mg into the skin at bedtime.     [provider]  lisinopril (PRINIVIL,ZESTRIL) 20 MG tablet Take 20 mg by mouth daily.    [provider]  losartan (COZAAR) 100 MG tablet Take 100 mg by mouth daily. 07/15/19   [provider]  MAGNESIUM PO Take 1 tablet by mouth at  bedtime.    [provider]  metFORMIN (GLUCOPHAGE) 1000 MG tablet Take 1,000 mg by mouth 2 (two) times daily.    [provider]  Multiple Vitamin (MULTIVITAMIN WITH MINERALS) TABS tablet Take 1 tablet by mouth at bedtime.    [provider]  ondansetron (ZOFRAN ODT) 4 MG disintegrating tablet Take 1 tablet (4 mg total) by mouth every 8 (eight) hours as needed. 08/04/19   Fredia Sorrow, MD    ROS: All other systems  have been reviewed and were otherwise negative with the exception of those mentioned in the HPI and as above.  Physical Exam: General: Alert, no acute distress Cardiovascular: No pedal edema Respiratory: No cyanosis, no use of accessory musculature GI: No organomegaly, abdomen is soft and non-tender Skin: No lesions in the area of chief complaint Neurologic: Sensation intact distally Psychiatric: Patient is competent for consent with normal mood and affect Lymphatic: No axillary or cervical lymphadenopathy  MUSCULOSKELETAL:  Right shoulder: no deformity about the right shoulder. Tender to palpation right proximal humerus. ROM not tested in setting of known fracture. + Motor in  AIN, PIN, Ulnar distributions. Axillary nerve sensation preserved and symmetric.  Sensation intact in medial, radial, and ulnar distributions. Well perfused digits.   Imaging: X-ray right shoulder: displaced greater tuberosity fracture CT ordered today.   Assessment: RIGHT PROXIMAL HUMERUS FRACTURE  Plan: Plan for Procedure(s): OPEN REDUCTION INTERNAL FIXATION (ORIF) RIGHT  PROXIMAL HUMERUS FRACTURE  The risks benefits and alternatives were discussed with the patient including but not limited to the risks of nonoperative treatment, versus surgical intervention including infection, bleeding, nerve injury,  blood clots, cardiopulmonary complications, morbidity, mortality, among others, and they were willing to proceed.   The patient acknowledged the explanation, agreed to  proceed with the plan and consent was signed.   Operative Plan: ORIF R proximal humerus fracture Discharge Medications: Tylenol, Celebrex, Oxycodone, Zofran DVT Prophylaxis: None Physical Therapy: Outpatient PT Special Discharge needs: [place], PA-C  08/06/2019 12:20 PM

## 2019-08-06 NOTE — Progress Notes (Signed)

## 2019-08-08 ENCOUNTER — Ambulatory Visit (HOSPITAL_BASED_OUTPATIENT_CLINIC_OR_DEPARTMENT_OTHER): Admission: RE | Admit: 2019-08-08 | Payer: Medicare PPO | Source: Home / Self Care | Admitting: Orthopaedic Surgery

## 2019-08-08 DIAGNOSIS — M25511 Pain in right shoulder: Secondary | ICD-10-CM | POA: Diagnosis not present

## 2019-08-08 HISTORY — DX: Other specified postprocedural states: Z98.890

## 2019-08-08 HISTORY — DX: Hyperlipidemia, unspecified: E78.5

## 2019-08-08 HISTORY — DX: Unspecified fracture of upper end of unspecified humerus, initial encounter for closed fracture: S42.209A

## 2019-08-08 HISTORY — DX: Nausea with vomiting, unspecified: R11.2

## 2019-08-08 HISTORY — DX: Other complications of anesthesia, initial encounter: T88.59XA

## 2019-08-08 SURGERY — OPEN REDUCTION INTERNAL FIXATION (ORIF) PROXIMAL HUMERUS FRACTURE
Anesthesia: Choice | Laterality: Right

## 2019-08-20 DIAGNOSIS — M25511 Pain in right shoulder: Secondary | ICD-10-CM | POA: Diagnosis not present

## 2019-08-23 ENCOUNTER — Ambulatory Visit (HOSPITAL_COMMUNITY): Payer: Medicare PPO

## 2019-09-11 DIAGNOSIS — Z7984 Long term (current) use of oral hypoglycemic drugs: Secondary | ICD-10-CM | POA: Diagnosis not present

## 2019-09-11 DIAGNOSIS — E669 Obesity, unspecified: Secondary | ICD-10-CM | POA: Diagnosis not present

## 2019-09-11 DIAGNOSIS — E1142 Type 2 diabetes mellitus with diabetic polyneuropathy: Secondary | ICD-10-CM | POA: Diagnosis not present

## 2019-09-11 DIAGNOSIS — H409 Unspecified glaucoma: Secondary | ICD-10-CM | POA: Diagnosis not present

## 2019-09-11 DIAGNOSIS — M810 Age-related osteoporosis without current pathological fracture: Secondary | ICD-10-CM | POA: Diagnosis not present

## 2019-09-11 DIAGNOSIS — E1165 Type 2 diabetes mellitus with hyperglycemia: Secondary | ICD-10-CM | POA: Diagnosis not present

## 2019-09-11 DIAGNOSIS — Z7982 Long term (current) use of aspirin: Secondary | ICD-10-CM | POA: Diagnosis not present

## 2019-09-11 DIAGNOSIS — E785 Hyperlipidemia, unspecified: Secondary | ICD-10-CM | POA: Diagnosis not present

## 2019-09-11 DIAGNOSIS — I10 Essential (primary) hypertension: Secondary | ICD-10-CM | POA: Diagnosis not present

## 2019-09-13 DIAGNOSIS — M25511 Pain in right shoulder: Secondary | ICD-10-CM | POA: Diagnosis not present

## 2019-09-17 DIAGNOSIS — S42201A Unspecified fracture of upper end of right humerus, initial encounter for closed fracture: Secondary | ICD-10-CM | POA: Diagnosis not present

## 2019-09-17 DIAGNOSIS — M25611 Stiffness of right shoulder, not elsewhere classified: Secondary | ICD-10-CM | POA: Diagnosis not present

## 2019-09-17 DIAGNOSIS — M25511 Pain in right shoulder: Secondary | ICD-10-CM | POA: Diagnosis not present

## 2019-09-19 DIAGNOSIS — M25611 Stiffness of right shoulder, not elsewhere classified: Secondary | ICD-10-CM | POA: Diagnosis not present

## 2019-09-19 DIAGNOSIS — M25511 Pain in right shoulder: Secondary | ICD-10-CM | POA: Diagnosis not present

## 2019-09-19 DIAGNOSIS — S42201A Unspecified fracture of upper end of right humerus, initial encounter for closed fracture: Secondary | ICD-10-CM | POA: Diagnosis not present

## 2019-09-19 DIAGNOSIS — S42201D Unspecified fracture of upper end of right humerus, subsequent encounter for fracture with routine healing: Secondary | ICD-10-CM | POA: Diagnosis not present

## 2019-09-24 DIAGNOSIS — S42201A Unspecified fracture of upper end of right humerus, initial encounter for closed fracture: Secondary | ICD-10-CM | POA: Diagnosis not present

## 2019-09-24 DIAGNOSIS — M25511 Pain in right shoulder: Secondary | ICD-10-CM | POA: Diagnosis not present

## 2019-09-24 DIAGNOSIS — S42201D Unspecified fracture of upper end of right humerus, subsequent encounter for fracture with routine healing: Secondary | ICD-10-CM | POA: Diagnosis not present

## 2019-09-24 DIAGNOSIS — M25611 Stiffness of right shoulder, not elsewhere classified: Secondary | ICD-10-CM | POA: Diagnosis not present

## 2019-09-26 DIAGNOSIS — M25511 Pain in right shoulder: Secondary | ICD-10-CM | POA: Diagnosis not present

## 2019-09-26 DIAGNOSIS — M25611 Stiffness of right shoulder, not elsewhere classified: Secondary | ICD-10-CM | POA: Diagnosis not present

## 2019-09-26 DIAGNOSIS — S42201A Unspecified fracture of upper end of right humerus, initial encounter for closed fracture: Secondary | ICD-10-CM | POA: Diagnosis not present

## 2019-10-01 DIAGNOSIS — S42201A Unspecified fracture of upper end of right humerus, initial encounter for closed fracture: Secondary | ICD-10-CM | POA: Diagnosis not present

## 2019-10-01 DIAGNOSIS — M25511 Pain in right shoulder: Secondary | ICD-10-CM | POA: Diagnosis not present

## 2019-10-01 DIAGNOSIS — M25611 Stiffness of right shoulder, not elsewhere classified: Secondary | ICD-10-CM | POA: Diagnosis not present

## 2019-10-03 DIAGNOSIS — S42201A Unspecified fracture of upper end of right humerus, initial encounter for closed fracture: Secondary | ICD-10-CM | POA: Diagnosis not present

## 2019-10-03 DIAGNOSIS — M25511 Pain in right shoulder: Secondary | ICD-10-CM | POA: Diagnosis not present

## 2019-10-03 DIAGNOSIS — M25611 Stiffness of right shoulder, not elsewhere classified: Secondary | ICD-10-CM | POA: Diagnosis not present

## 2019-10-08 DIAGNOSIS — S42201A Unspecified fracture of upper end of right humerus, initial encounter for closed fracture: Secondary | ICD-10-CM | POA: Diagnosis not present

## 2019-10-08 DIAGNOSIS — M25611 Stiffness of right shoulder, not elsewhere classified: Secondary | ICD-10-CM | POA: Diagnosis not present

## 2019-10-08 DIAGNOSIS — M25511 Pain in right shoulder: Secondary | ICD-10-CM | POA: Diagnosis not present

## 2019-10-10 DIAGNOSIS — S42201A Unspecified fracture of upper end of right humerus, initial encounter for closed fracture: Secondary | ICD-10-CM | POA: Diagnosis not present

## 2019-10-10 DIAGNOSIS — M25511 Pain in right shoulder: Secondary | ICD-10-CM | POA: Diagnosis not present

## 2019-10-10 DIAGNOSIS — M25611 Stiffness of right shoulder, not elsewhere classified: Secondary | ICD-10-CM | POA: Diagnosis not present

## 2019-10-17 DIAGNOSIS — M25611 Stiffness of right shoulder, not elsewhere classified: Secondary | ICD-10-CM | POA: Diagnosis not present

## 2019-10-17 DIAGNOSIS — S42201D Unspecified fracture of upper end of right humerus, subsequent encounter for fracture with routine healing: Secondary | ICD-10-CM | POA: Diagnosis not present

## 2019-10-17 DIAGNOSIS — M25511 Pain in right shoulder: Secondary | ICD-10-CM | POA: Diagnosis not present

## 2019-10-22 DIAGNOSIS — M25611 Stiffness of right shoulder, not elsewhere classified: Secondary | ICD-10-CM | POA: Diagnosis not present

## 2019-10-22 DIAGNOSIS — M25511 Pain in right shoulder: Secondary | ICD-10-CM | POA: Diagnosis not present

## 2019-10-22 DIAGNOSIS — S42201D Unspecified fracture of upper end of right humerus, subsequent encounter for fracture with routine healing: Secondary | ICD-10-CM | POA: Diagnosis not present

## 2019-10-24 ENCOUNTER — Ambulatory Visit (HOSPITAL_COMMUNITY): Payer: Medicare PPO

## 2019-10-24 DIAGNOSIS — M25511 Pain in right shoulder: Secondary | ICD-10-CM | POA: Diagnosis not present

## 2019-10-24 DIAGNOSIS — M25611 Stiffness of right shoulder, not elsewhere classified: Secondary | ICD-10-CM | POA: Diagnosis not present

## 2019-10-24 DIAGNOSIS — S46201D Unspecified injury of muscle, fascia and tendon of other parts of biceps, right arm, subsequent encounter: Secondary | ICD-10-CM | POA: Diagnosis not present

## 2019-10-29 DIAGNOSIS — M25611 Stiffness of right shoulder, not elsewhere classified: Secondary | ICD-10-CM | POA: Diagnosis not present

## 2019-10-29 DIAGNOSIS — M25511 Pain in right shoulder: Secondary | ICD-10-CM | POA: Diagnosis not present

## 2019-10-29 DIAGNOSIS — S42201D Unspecified fracture of upper end of right humerus, subsequent encounter for fracture with routine healing: Secondary | ICD-10-CM | POA: Diagnosis not present

## 2019-11-01 DIAGNOSIS — H903 Sensorineural hearing loss, bilateral: Secondary | ICD-10-CM | POA: Diagnosis not present

## 2019-11-01 DIAGNOSIS — H9113 Presbycusis, bilateral: Secondary | ICD-10-CM | POA: Diagnosis not present

## 2019-11-01 DIAGNOSIS — H9311 Tinnitus, right ear: Secondary | ICD-10-CM | POA: Diagnosis not present

## 2019-11-04 DIAGNOSIS — H401133 Primary open-angle glaucoma, bilateral, severe stage: Secondary | ICD-10-CM | POA: Diagnosis not present

## 2019-11-05 DIAGNOSIS — M25611 Stiffness of right shoulder, not elsewhere classified: Secondary | ICD-10-CM | POA: Diagnosis not present

## 2019-11-05 DIAGNOSIS — M25511 Pain in right shoulder: Secondary | ICD-10-CM | POA: Diagnosis not present

## 2019-11-05 DIAGNOSIS — S42201D Unspecified fracture of upper end of right humerus, subsequent encounter for fracture with routine healing: Secondary | ICD-10-CM | POA: Diagnosis not present

## 2019-11-07 DIAGNOSIS — M25611 Stiffness of right shoulder, not elsewhere classified: Secondary | ICD-10-CM | POA: Diagnosis not present

## 2019-11-07 DIAGNOSIS — S42201D Unspecified fracture of upper end of right humerus, subsequent encounter for fracture with routine healing: Secondary | ICD-10-CM | POA: Diagnosis not present

## 2019-11-07 DIAGNOSIS — M25511 Pain in right shoulder: Secondary | ICD-10-CM | POA: Diagnosis not present

## 2019-11-12 DIAGNOSIS — M25511 Pain in right shoulder: Secondary | ICD-10-CM | POA: Diagnosis not present

## 2019-11-12 DIAGNOSIS — S42201D Unspecified fracture of upper end of right humerus, subsequent encounter for fracture with routine healing: Secondary | ICD-10-CM | POA: Diagnosis not present

## 2019-11-12 DIAGNOSIS — M25611 Stiffness of right shoulder, not elsewhere classified: Secondary | ICD-10-CM | POA: Diagnosis not present

## 2019-11-19 ENCOUNTER — Telehealth: Payer: Self-pay | Admitting: Adult Health

## 2019-11-19 DIAGNOSIS — M25511 Pain in right shoulder: Secondary | ICD-10-CM | POA: Diagnosis not present

## 2019-11-19 DIAGNOSIS — M25611 Stiffness of right shoulder, not elsewhere classified: Secondary | ICD-10-CM | POA: Diagnosis not present

## 2019-11-19 DIAGNOSIS — S42201D Unspecified fracture of upper end of right humerus, subsequent encounter for fracture with routine healing: Secondary | ICD-10-CM | POA: Diagnosis not present

## 2019-11-19 NOTE — Telephone Encounter (Signed)

## 2019-11-20 ENCOUNTER — Other Ambulatory Visit (HOSPITAL_COMMUNITY)
Admission: RE | Admit: 2019-11-20 | Discharge: 2019-11-20 | Disposition: A | Discharge status: Home / Self Care | Payer: Medicare PPO | Source: Ambulatory Visit | Attending: Adult Health | Admitting: Adult Health

## 2019-11-20 ENCOUNTER — Ambulatory Visit (INDEPENDENT_AMBULATORY_CARE_PROVIDER_SITE_OTHER): Payer: Medicare PPO | Admitting: Adult Health

## 2019-11-20 ENCOUNTER — Encounter: Payer: Self-pay | Admitting: Adult Health

## 2019-11-20 VITALS — BP 129/80 | HR 72 | Ht 66.5 in | Wt 220.0 lb

## 2019-11-20 DIAGNOSIS — L900 Lichen sclerosus et atrophicus: Secondary | ICD-10-CM | POA: Diagnosis not present

## 2019-11-20 DIAGNOSIS — Z780 Asymptomatic menopausal state: Secondary | ICD-10-CM | POA: Insufficient documentation

## 2019-11-20 DIAGNOSIS — Z1212 Encounter for screening for malignant neoplasm of rectum: Secondary | ICD-10-CM

## 2019-11-20 DIAGNOSIS — Z01419 Encounter for gynecological examination (general) (routine) without abnormal findings: Secondary | ICD-10-CM | POA: Insufficient documentation

## 2019-11-20 DIAGNOSIS — Z1211 Encounter for screening for malignant neoplasm of colon: Secondary | ICD-10-CM | POA: Insufficient documentation

## 2019-11-20 DIAGNOSIS — Z1151 Encounter for screening for human papillomavirus (HPV): Secondary | ICD-10-CM | POA: Insufficient documentation

## 2019-11-20 DIAGNOSIS — Z124 Encounter for screening for malignant neoplasm of cervix: Secondary | ICD-10-CM | POA: Insufficient documentation

## 2019-11-20 DIAGNOSIS — Z1272 Encounter for screening for malignant neoplasm of vagina: Secondary | ICD-10-CM

## 2019-11-20 LAB — HEMOCCULT GUIAC POC 1CARD (OFFICE): Fecal Occult Blood, POC: NEGATIVE

## 2019-11-20 MED ORDER — CLOBETASOL PROP EMOLLIENT BASE 0.05 % EX CREA: TOPICAL_CREAM | CUTANEOUS | 3 refills | Status: DC

## 2019-11-20 NOTE — Progress Notes (Signed)
Patient ID: [name], female   DOB: [birth year], 69 y.o.   MRN: [id number] History of Present Illness: [name] is a 69 year old white female, widowed, PM in for well woman gyn exam and pap, and has vaginal itching and odor. She feel off her bed in February and broke her right shoulder and is going to PT. PCP is Dr Willey Blade.   Current Medications, Allergies, Past Medical History, Past Surgical History, Family History and Social History were reviewed in [employer] record.     Review of Systems: Patient denies any headaches, hearing loss, fatigue, blurred vision, shortness of breath, chest pain, abdominal pain, problems with bowel movements, urination, or intercourse(not active). No joint pain or mood swings. +vaginal itching and odor   Physical Exam:BP 129/80 (BP Location: Left Arm, Patient Position: Sitting, Cuff Size: Normal)   Pulse 72   Ht 5' 6.5" (1.689 m)   Wt 220 lb (99.8 kg)   BMI 34.98 kg/m  General:  Well developed, well nourished, no acute distress Skin:  Warm and dry Neck:  Midline trachea, normal thyroid, good ROM, no lymphadenopathy,no carotid bruits heard Lungs; Clear to auscultation bilaterally Breast:  No dominant palpable mass, retraction, or nipple discharge Cardiovascular: Regular rate and rhythm Abdomen:  Soft, non tender, no hepatosplenomegaly,has some bruises from Victoza injections Pelvic:  External genitalia has thin white skin from labia to anal area, there is some bruising inner labia,where she scratched. The vagina is atrophic with loss of color and moisture and rugae.Marland Kitchen Urethra has no lesions or masses. The cervix is atrophic, pap with high risk HPV performed.  Uterus is felt to be normal size, shape, and contour.  No adnexal masses or tenderness noted.Bladder is non tender, no masses felt. Rectal: Good sphincter tone, no polyps, or hemorrhoids felt.  Hemoccult negative. Extremities/musculoskeletal:  No swelling,+varicosities noted, no  clubbing or cyanosis Psych:  No mood changes, alert and cooperative,seems happy AA 0 Fall risk is high PHQ 9 score is 2 Examination chaperoned by Diona Fanti CMA   Impression and Plan: 1. Routine cervical smear Pap sent  2. Encounter for gynecological examination with Papanicolaou smear of cervix Pap sent, can stop at 75  Physical with PCP Pap in 3 if normal Get mammogram yearly  3. Screening for colorectal cancer Colonoscopy per GI  4. Lichen sclerosus et atrophicus Start back on temovate Meds ordered this encounter  Medications  . Clobetasol Prop Emollient Base 0.05 % emollient cream    Sig: Use bid to affected areas for 2 weeks then 3 x a week    Dispense:  45 g    Refill:  3    Order Specific Question:   Supervising Provider    Answer:   Tania Ade H [[id number]]  Recheck in 3 months

## 2019-11-21 ENCOUNTER — Ambulatory Visit (HOSPITAL_COMMUNITY): Payer: Medicare PPO

## 2019-11-21 DIAGNOSIS — M25611 Stiffness of right shoulder, not elsewhere classified: Secondary | ICD-10-CM | POA: Diagnosis not present

## 2019-11-21 DIAGNOSIS — S42201D Unspecified fracture of upper end of right humerus, subsequent encounter for fracture with routine healing: Secondary | ICD-10-CM | POA: Diagnosis not present

## 2019-11-21 DIAGNOSIS — M25511 Pain in right shoulder: Secondary | ICD-10-CM | POA: Diagnosis not present

## 2019-11-22 LAB — CYTOLOGY - PAP
Comment: NEGATIVE
Diagnosis: NEGATIVE
High risk HPV: NEGATIVE

## 2019-12-03 DIAGNOSIS — S42201D Unspecified fracture of upper end of right humerus, subsequent encounter for fracture with routine healing: Secondary | ICD-10-CM | POA: Diagnosis not present

## 2019-12-03 DIAGNOSIS — M25511 Pain in right shoulder: Secondary | ICD-10-CM | POA: Diagnosis not present

## 2019-12-03 DIAGNOSIS — M25611 Stiffness of right shoulder, not elsewhere classified: Secondary | ICD-10-CM | POA: Diagnosis not present

## 2019-12-05 ENCOUNTER — Telehealth: Payer: Self-pay | Admitting: Adult Health

## 2019-12-05 NOTE — Telephone Encounter (Signed)
Pt states she is scheduled for mammogram and is having a lot of pain in her breasts and is wondering if the mm should be r/s sooner

## 2019-12-05 NOTE — Telephone Encounter (Signed)
Pt is having pain in right breast. Has not felt anything abnormal. Pt has a mammogram scheduled for 7/9. No redness, no drainage from nipple. Recently saw Jenn for her physical. She didn't have any pain when she saw Jenn last. I spoke with FCD, CNM. Pt was advised to limit caffeine intake and see if that helps with the pain. If pain continues after limiting caffeine, call office for appt. Pt voiced understanding. [name]

## 2019-12-09 ENCOUNTER — Other Ambulatory Visit: Payer: Medicare PPO | Admitting: Adult Health

## 2019-12-16 DIAGNOSIS — H903 Sensorineural hearing loss, bilateral: Secondary | ICD-10-CM | POA: Diagnosis not present

## 2019-12-23 DIAGNOSIS — Z79899 Other long term (current) drug therapy: Secondary | ICD-10-CM | POA: Diagnosis not present

## 2019-12-23 DIAGNOSIS — E1129 Type 2 diabetes mellitus with other diabetic kidney complication: Secondary | ICD-10-CM | POA: Diagnosis not present

## 2019-12-23 DIAGNOSIS — I10 Essential (primary) hypertension: Secondary | ICD-10-CM | POA: Diagnosis not present

## 2019-12-23 DIAGNOSIS — E785 Hyperlipidemia, unspecified: Secondary | ICD-10-CM | POA: Diagnosis not present

## 2019-12-23 DIAGNOSIS — H409 Unspecified glaucoma: Secondary | ICD-10-CM | POA: Diagnosis not present

## 2019-12-23 DIAGNOSIS — D696 Thrombocytopenia, unspecified: Secondary | ICD-10-CM | POA: Diagnosis not present

## 2019-12-27 ENCOUNTER — Other Ambulatory Visit (HOSPITAL_COMMUNITY): Payer: Self-pay | Admitting: Internal Medicine

## 2019-12-27 DIAGNOSIS — E1122 Type 2 diabetes mellitus with diabetic chronic kidney disease: Secondary | ICD-10-CM | POA: Diagnosis not present

## 2019-12-27 DIAGNOSIS — Z0001 Encounter for general adult medical examination with abnormal findings: Secondary | ICD-10-CM | POA: Diagnosis not present

## 2019-12-27 DIAGNOSIS — Z6837 Body mass index (BMI) 37.0-37.9, adult: Secondary | ICD-10-CM | POA: Diagnosis not present

## 2019-12-27 DIAGNOSIS — D696 Thrombocytopenia, unspecified: Secondary | ICD-10-CM | POA: Diagnosis not present

## 2019-12-27 DIAGNOSIS — I447 Left bundle-branch block, unspecified: Secondary | ICD-10-CM | POA: Diagnosis not present

## 2019-12-27 DIAGNOSIS — I10 Essential (primary) hypertension: Secondary | ICD-10-CM | POA: Diagnosis not present

## 2019-12-27 DIAGNOSIS — E785 Hyperlipidemia, unspecified: Secondary | ICD-10-CM | POA: Diagnosis not present

## 2019-12-27 DIAGNOSIS — Z780 Asymptomatic menopausal state: Secondary | ICD-10-CM

## 2020-01-03 ENCOUNTER — Ambulatory Visit (HOSPITAL_COMMUNITY): Payer: Medicare PPO

## 2020-01-10 ENCOUNTER — Ambulatory Visit (HOSPITAL_COMMUNITY)
Admission: RE | Admit: 2020-01-10 | Discharge: 2020-01-10 | Disposition: A | Discharge status: Home / Self Care | Payer: Medicare PPO | Source: Ambulatory Visit | Attending: Internal Medicine | Admitting: Internal Medicine

## 2020-01-10 ENCOUNTER — Other Ambulatory Visit: Payer: Self-pay

## 2020-01-10 DIAGNOSIS — Z1231 Encounter for screening mammogram for malignant neoplasm of breast: Secondary | ICD-10-CM | POA: Diagnosis not present

## 2020-01-10 DIAGNOSIS — Z780 Asymptomatic menopausal state: Secondary | ICD-10-CM | POA: Insufficient documentation

## 2020-01-10 DIAGNOSIS — M8589 Other specified disorders of bone density and structure, multiple sites: Secondary | ICD-10-CM | POA: Diagnosis not present

## 2020-01-13 DIAGNOSIS — D1801 Hemangioma of skin and subcutaneous tissue: Secondary | ICD-10-CM | POA: Diagnosis not present

## 2020-01-13 DIAGNOSIS — L821 Other seborrheic keratosis: Secondary | ICD-10-CM | POA: Diagnosis not present

## 2020-01-13 DIAGNOSIS — L570 Actinic keratosis: Secondary | ICD-10-CM | POA: Diagnosis not present

## 2020-02-20 ENCOUNTER — Encounter: Payer: Self-pay | Admitting: Adult Health

## 2020-02-20 ENCOUNTER — Ambulatory Visit: Payer: Medicare PPO | Admitting: Adult Health

## 2020-02-20 VITALS — BP 124/74 | HR 70 | Ht 66.5 in | Wt 227.4 lb

## 2020-02-20 DIAGNOSIS — L900 Lichen sclerosus et atrophicus: Secondary | ICD-10-CM

## 2020-02-20 NOTE — Progress Notes (Signed)
  Subjective:     Patient ID: [name], female   DOB: [birth year], 69 y.o.   MRN: [id number]  HPI [name] is a 69 year old white female, widowed, PM, back in for follow up on using temovate regularly for lichen sclerosus, and she is better. She had normal pap with negative HPV 11/20/19  PCP is Dr Willey Blade.  Review of Systems Feels better Reviewed past medical,surgical, social and family history. Reviewed medications and allergies.     Objective:   Physical Exam   BP 124/74 (BP Location: Left Arm, Patient Position: Sitting, Cuff Size: Normal)   Pulse 70   Ht 5' 6.5" (1.689 m)   Wt 227 lb 6.4 oz (103.1 kg)   BMI 36.15 kg/m    Skin warm and dry.Pelvic: external genitalia has thin pale skin on labia extending to rectal area, but not has white has in May, has red area left labia that looks thickened.  Pt gave verbal consent for exam without a chaperone.    [facility] - 02/20/20 1339      Pregnancy Intention Screening   Does the patient want to become pregnant in the next year? N/A   postmenopausal   Does the patient's partner want to become pregnant in the next year? N/A    Would the patient like to discuss contraceptive options today? No      Contraception Wrap Up   Contraception Counseling Provided No          Assessment:     1. Lichen sclerosus et atrophicus,looks better and she is aware it is chronic  Continue temovate, 2-3 x weekly has refills     Plan:     Follow up in 6 months or sooner if needed

## 2020-03-20 DIAGNOSIS — H401133 Primary open-angle glaucoma, bilateral, severe stage: Secondary | ICD-10-CM | POA: Diagnosis not present

## 2020-04-21 DIAGNOSIS — E1129 Type 2 diabetes mellitus with other diabetic kidney complication: Secondary | ICD-10-CM | POA: Diagnosis not present

## 2020-04-23 DIAGNOSIS — H401133 Primary open-angle glaucoma, bilateral, severe stage: Secondary | ICD-10-CM | POA: Diagnosis not present

## 2020-04-28 DIAGNOSIS — I10 Essential (primary) hypertension: Secondary | ICD-10-CM | POA: Diagnosis not present

## 2020-04-28 DIAGNOSIS — Z23 Encounter for immunization: Secondary | ICD-10-CM | POA: Diagnosis not present

## 2020-04-28 DIAGNOSIS — R7309 Other abnormal glucose: Secondary | ICD-10-CM | POA: Diagnosis not present

## 2020-04-28 DIAGNOSIS — E1122 Type 2 diabetes mellitus with diabetic chronic kidney disease: Secondary | ICD-10-CM | POA: Diagnosis not present

## 2020-07-15 DIAGNOSIS — J342 Deviated nasal septum: Secondary | ICD-10-CM | POA: Diagnosis not present

## 2020-07-15 DIAGNOSIS — Z974 Presence of external hearing-aid: Secondary | ICD-10-CM | POA: Diagnosis not present

## 2020-07-15 DIAGNOSIS — H93A1 Pulsatile tinnitus, right ear: Secondary | ICD-10-CM | POA: Diagnosis not present

## 2020-07-15 DIAGNOSIS — J310 Chronic rhinitis: Secondary | ICD-10-CM | POA: Diagnosis not present

## 2020-07-15 DIAGNOSIS — H6123 Impacted cerumen, bilateral: Secondary | ICD-10-CM | POA: Diagnosis not present

## 2020-09-01 DIAGNOSIS — E1129 Type 2 diabetes mellitus with other diabetic kidney complication: Secondary | ICD-10-CM | POA: Diagnosis not present

## 2020-09-07 DIAGNOSIS — I10 Essential (primary) hypertension: Secondary | ICD-10-CM | POA: Diagnosis not present

## 2020-09-07 DIAGNOSIS — E1122 Type 2 diabetes mellitus with diabetic chronic kidney disease: Secondary | ICD-10-CM | POA: Diagnosis not present

## 2020-09-07 DIAGNOSIS — R7309 Other abnormal glucose: Secondary | ICD-10-CM | POA: Diagnosis not present

## 2020-09-10 DIAGNOSIS — H33001 Unspecified retinal detachment with retinal break, right eye: Secondary | ICD-10-CM | POA: Diagnosis not present

## 2020-09-10 DIAGNOSIS — H442E3 Degenerative myopia with other maculopathy, bilateral eye: Secondary | ICD-10-CM | POA: Diagnosis not present

## 2020-09-10 DIAGNOSIS — E119 Type 2 diabetes mellitus without complications: Secondary | ICD-10-CM | POA: Diagnosis not present

## 2020-09-10 DIAGNOSIS — H2703 Aphakia, bilateral: Secondary | ICD-10-CM | POA: Diagnosis not present

## 2020-09-14 DIAGNOSIS — H903 Sensorineural hearing loss, bilateral: Secondary | ICD-10-CM | POA: Diagnosis not present

## 2020-09-14 DIAGNOSIS — H93A1 Pulsatile tinnitus, right ear: Secondary | ICD-10-CM | POA: Diagnosis not present

## 2020-09-14 DIAGNOSIS — J310 Chronic rhinitis: Secondary | ICD-10-CM | POA: Diagnosis not present

## 2020-09-14 DIAGNOSIS — Z974 Presence of external hearing-aid: Secondary | ICD-10-CM | POA: Diagnosis not present

## 2020-09-16 DIAGNOSIS — H401133 Primary open-angle glaucoma, bilateral, severe stage: Secondary | ICD-10-CM | POA: Diagnosis not present

## 2020-11-03 DIAGNOSIS — H903 Sensorineural hearing loss, bilateral: Secondary | ICD-10-CM | POA: Diagnosis not present

## 2020-11-25 ENCOUNTER — Other Ambulatory Visit: Payer: Self-pay

## 2020-11-25 ENCOUNTER — Ambulatory Visit (INDEPENDENT_AMBULATORY_CARE_PROVIDER_SITE_OTHER): Payer: Medicare PPO | Admitting: Adult Health

## 2020-11-25 ENCOUNTER — Encounter: Payer: Self-pay | Admitting: Adult Health

## 2020-11-25 VITALS — BP 130/79 | HR 77 | Ht 65.5 in | Wt 222.5 lb

## 2020-11-25 DIAGNOSIS — Z01419 Encounter for gynecological examination (general) (routine) without abnormal findings: Secondary | ICD-10-CM

## 2020-11-25 DIAGNOSIS — L900 Lichen sclerosus et atrophicus: Secondary | ICD-10-CM

## 2020-11-25 DIAGNOSIS — Z1211 Encounter for screening for malignant neoplasm of colon: Secondary | ICD-10-CM

## 2020-11-25 LAB — HEMOCCULT GUIAC POC 1CARD (OFFICE): Fecal Occult Blood, POC: NEGATIVE

## 2020-11-25 NOTE — Progress Notes (Signed)
Patient ID: [name], female   DOB: 07/28/1950, 70 y.o.   MRN: [id number] History of Present Illness: [name] is a 70 year old white female, widowed, PM in for a well woman gyn exam. Lab Results  Component Value Date   DIAGPAP  11/20/2019    - Negative for intraepithelial lesion or malignancy (NILM)   HPV NOT DETECTED 12/02/2016   [place] Negative 11/20/2019  She is going to [place] in July. PCP is Dr Willey Blade   Current Medications, Allergies, Past Medical History, Past Surgical History, Family History and Social History were reviewed in [facility] record.     Review of Systems:  Patient denies any headaches, hearing loss, fatigue, blurred vision, shortness of breath, chest pain, abdominal pain, problems with bowel movements, urination, or intercourse.(not active) No joint pain or mood swings.   Physical Exam:BP 130/79 (BP Location: Left Arm, Patient Position: Sitting, Cuff Size: Large)   Pulse 77   Ht 5' 5.5" (1.664 m)   Wt 222 lb 8 oz (100.9 kg)   BMI 36.46 kg/m  General:  Well developed, well nourished, no acute distress Skin:  Warm and dry Neck:  Midline trachea, normal thyroid, good ROM, no lymphadenopathy,no carotid bruits heard Lungs; Clear to auscultation bilaterally Breast:  No dominant palpable mass, retraction, or nipple discharge Cardiovascular: Regular rate and rhythm Abdomen:  Soft, non tender, no hepatosplenomegaly Pelvic:  External genitalia is normal in appearance,has white areas, known Lichen sclerous.  The vagina is pale and atrophic. Urethra has no lesions or masses. The cervix is smooth.  Uterus is felt to be normal size, shape, and contour.  No adnexal masses or tenderness noted.Bladder is non tender, no masses felt. Rectal: Good sphincter tone, no polyps, or hemorrhoids felt.  Hemoccult negative. Extremities/musculoskeletal:  No swelling,+ varicosities noted, no clubbing or cyanosis Psych:  No mood changes, alert and cooperative,seems  happy AA  Is 0  Fall risk is moderate PHQ 9 score is 4 GAD 7 score is 0  [place] - 11/25/20 1134      Pregnancy Intention Screening   Does the patient want to become pregnant in the next year? N/A    Does the patient's partner want to become pregnant in the next year? N/A    Would the patient like to discuss contraceptive options today? N/A      Contraception Wrap Up   Current Method Female Sterilization   postmenopausal   End Method Female Sterilization   postmenopausal   Contraception Counseling Provided No         Examination chaperoned by Levy Pupa LPN  Impression and Plan: 1. Encounter for well woman exam with routine gynecological exam Physical with PCP No more paps needed Mammogram yearly Labs with PCP  2. Lichen sclerosus et atrophicus Follow up in 6 months or sooner if needed  3. Encounter for screening fecal occult blood testing

## 2020-12-17 DIAGNOSIS — H401133 Primary open-angle glaucoma, bilateral, severe stage: Secondary | ICD-10-CM | POA: Diagnosis not present

## 2021-01-04 ENCOUNTER — Telehealth: Payer: Self-pay | Admitting: Adult Health

## 2021-01-04 NOTE — Telephone Encounter (Signed)
Patient called stating that she would like to speak with Anderson Malta, Patient did not state the reason for the call. Please contact pt
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
## 2021-01-06 ENCOUNTER — Encounter: Payer: Self-pay | Admitting: Adult Health

## 2021-01-06 ENCOUNTER — Other Ambulatory Visit: Payer: Self-pay

## 2021-01-06 ENCOUNTER — Ambulatory Visit: Payer: Medicare PPO | Admitting: Adult Health

## 2021-01-06 VITALS — BP 128/79 | HR 71 | Ht 66.2 in | Wt 218.0 lb

## 2021-01-06 DIAGNOSIS — N6311 Unspecified lump in the right breast, upper outer quadrant: Secondary | ICD-10-CM

## 2021-01-06 DIAGNOSIS — S299XXA Unspecified injury of thorax, initial encounter: Secondary | ICD-10-CM

## 2021-01-06 DIAGNOSIS — R413 Other amnesia: Secondary | ICD-10-CM | POA: Diagnosis not present
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
## 2021-01-07 DIAGNOSIS — M79621 Pain in right upper arm: Secondary | ICD-10-CM | POA: Diagnosis not present

## 2021-01-26 ENCOUNTER — Ambulatory Visit (HOSPITAL_COMMUNITY)
Admission: RE | Admit: 2021-01-26 | Discharge: 2021-01-26 | Disposition: A | Discharge status: Home / Self Care | Payer: Medicare PPO | Source: Ambulatory Visit | Attending: Adult Health | Admitting: Adult Health

## 2021-01-26 ENCOUNTER — Other Ambulatory Visit: Payer: Self-pay

## 2021-01-26 DIAGNOSIS — S299XXA Unspecified injury of thorax, initial encounter: Secondary | ICD-10-CM | POA: Insufficient documentation

## 2021-01-26 DIAGNOSIS — N6311 Unspecified lump in the right breast, upper outer quadrant: Secondary | ICD-10-CM | POA: Diagnosis not present

## 2021-01-26 DIAGNOSIS — R922 Inconclusive mammogram: Secondary | ICD-10-CM | POA: Diagnosis not present

## 2021-01-28 DIAGNOSIS — R609 Edema, unspecified: Secondary | ICD-10-CM | POA: Diagnosis not present

## 2021-01-28 DIAGNOSIS — I491 Atrial premature depolarization: Secondary | ICD-10-CM | POA: Diagnosis not present

## 2021-02-17 DIAGNOSIS — I10 Essential (primary) hypertension: Secondary | ICD-10-CM | POA: Diagnosis not present

## 2021-02-17 DIAGNOSIS — R42 Dizziness and giddiness: Secondary | ICD-10-CM | POA: Diagnosis not present

## 2021-02-19 DIAGNOSIS — Z974 Presence of external hearing-aid: Secondary | ICD-10-CM | POA: Diagnosis not present

## 2021-02-19 DIAGNOSIS — T162XXA Foreign body in left ear, initial encounter: Secondary | ICD-10-CM | POA: Diagnosis not present

## 2021-02-19 DIAGNOSIS — H903 Sensorineural hearing loss, bilateral: Secondary | ICD-10-CM | POA: Diagnosis not present

## 2021-02-19 DIAGNOSIS — H6121 Impacted cerumen, right ear: Secondary | ICD-10-CM | POA: Diagnosis not present

## 2021-03-02 DIAGNOSIS — E1129 Type 2 diabetes mellitus with other diabetic kidney complication: Secondary | ICD-10-CM | POA: Diagnosis not present

## 2021-03-02 DIAGNOSIS — E785 Hyperlipidemia, unspecified: Secondary | ICD-10-CM | POA: Diagnosis not present

## 2021-03-02 DIAGNOSIS — G9009 Other idiopathic peripheral autonomic neuropathy: Secondary | ICD-10-CM | POA: Diagnosis not present

## 2021-03-02 DIAGNOSIS — Z79899 Other long term (current) drug therapy: Secondary | ICD-10-CM | POA: Diagnosis not present

## 2021-03-02 DIAGNOSIS — D696 Thrombocytopenia, unspecified: Secondary | ICD-10-CM | POA: Diagnosis not present

## 2021-03-02 DIAGNOSIS — I10 Essential (primary) hypertension: Secondary | ICD-10-CM | POA: Diagnosis not present

## 2021-03-02 DIAGNOSIS — I447 Left bundle-branch block, unspecified: Secondary | ICD-10-CM | POA: Diagnosis not present

## 2021-03-02 DIAGNOSIS — D869 Sarcoidosis, unspecified: Secondary | ICD-10-CM | POA: Diagnosis not present

## 2021-03-08 DIAGNOSIS — D696 Thrombocytopenia, unspecified: Secondary | ICD-10-CM | POA: Diagnosis not present

## 2021-03-08 DIAGNOSIS — I491 Atrial premature depolarization: Secondary | ICD-10-CM | POA: Diagnosis not present

## 2021-03-08 DIAGNOSIS — Z6836 Body mass index (BMI) 36.0-36.9, adult: Secondary | ICD-10-CM | POA: Diagnosis not present

## 2021-03-08 DIAGNOSIS — I10 Essential (primary) hypertension: Secondary | ICD-10-CM | POA: Diagnosis not present

## 2021-03-08 DIAGNOSIS — Z23 Encounter for immunization: Secondary | ICD-10-CM | POA: Diagnosis not present

## 2021-03-08 DIAGNOSIS — R7309 Other abnormal glucose: Secondary | ICD-10-CM | POA: Diagnosis not present

## 2021-03-08 DIAGNOSIS — E1122 Type 2 diabetes mellitus with diabetic chronic kidney disease: Secondary | ICD-10-CM | POA: Diagnosis not present

## 2021-03-24 ENCOUNTER — Inpatient Hospital Stay (HOSPITAL_COMMUNITY): Payer: Medicare PPO

## 2021-03-24 ENCOUNTER — Inpatient Hospital Stay (HOSPITAL_COMMUNITY)
Admission: EM | Admit: 2021-03-24 | Discharge: 2021-03-27 | DRG: 286 | Disposition: A | Discharge status: Home / Self Care | Payer: Medicare PPO | Attending: Cardiology | Admitting: Cardiology

## 2021-03-24 ENCOUNTER — Encounter (HOSPITAL_COMMUNITY): Payer: Self-pay | Admitting: *Deleted

## 2021-03-24 ENCOUNTER — Other Ambulatory Visit: Payer: Self-pay

## 2021-03-24 ENCOUNTER — Emergency Department (HOSPITAL_COMMUNITY): Payer: Medicare PPO

## 2021-03-24 DIAGNOSIS — I5021 Acute systolic (congestive) heart failure: Secondary | ICD-10-CM

## 2021-03-24 DIAGNOSIS — Z8249 Family history of ischemic heart disease and other diseases of the circulatory system: Secondary | ICD-10-CM | POA: Diagnosis not present

## 2021-03-24 DIAGNOSIS — R911 Solitary pulmonary nodule: Secondary | ICD-10-CM | POA: Diagnosis not present

## 2021-03-24 DIAGNOSIS — I313 Pericardial effusion (noninflammatory): Secondary | ICD-10-CM | POA: Diagnosis not present

## 2021-03-24 DIAGNOSIS — Z882 Allergy status to sulfonamides status: Secondary | ICD-10-CM | POA: Diagnosis not present

## 2021-03-24 DIAGNOSIS — Z885 Allergy status to narcotic agent status: Secondary | ICD-10-CM | POA: Diagnosis not present

## 2021-03-24 DIAGNOSIS — I509 Heart failure, unspecified: Secondary | ICD-10-CM

## 2021-03-24 DIAGNOSIS — E785 Hyperlipidemia, unspecified: Secondary | ICD-10-CM | POA: Diagnosis not present

## 2021-03-24 DIAGNOSIS — E1140 Type 2 diabetes mellitus with diabetic neuropathy, unspecified: Secondary | ICD-10-CM | POA: Diagnosis not present

## 2021-03-24 DIAGNOSIS — I472 Ventricular tachycardia, unspecified: Secondary | ICD-10-CM | POA: Diagnosis not present

## 2021-03-24 DIAGNOSIS — I428 Other cardiomyopathies: Secondary | ICD-10-CM | POA: Diagnosis not present

## 2021-03-24 DIAGNOSIS — I110 Hypertensive heart disease with heart failure: Secondary | ICD-10-CM | POA: Diagnosis not present

## 2021-03-24 DIAGNOSIS — H409 Unspecified glaucoma: Secondary | ICD-10-CM | POA: Diagnosis present

## 2021-03-24 DIAGNOSIS — Z833 Family history of diabetes mellitus: Secondary | ICD-10-CM | POA: Diagnosis not present

## 2021-03-24 DIAGNOSIS — D869 Sarcoidosis, unspecified: Secondary | ICD-10-CM | POA: Diagnosis not present

## 2021-03-24 DIAGNOSIS — J90 Pleural effusion, not elsewhere classified: Secondary | ICD-10-CM | POA: Diagnosis not present

## 2021-03-24 DIAGNOSIS — Z7984 Long term (current) use of oral hypoglycemic drugs: Secondary | ICD-10-CM

## 2021-03-24 DIAGNOSIS — I4891 Unspecified atrial fibrillation: Secondary | ICD-10-CM | POA: Diagnosis present

## 2021-03-24 DIAGNOSIS — D860 Sarcoidosis of lung: Secondary | ICD-10-CM | POA: Diagnosis present

## 2021-03-24 DIAGNOSIS — J9811 Atelectasis: Secondary | ICD-10-CM | POA: Diagnosis not present

## 2021-03-24 DIAGNOSIS — Z83438 Family history of other disorder of lipoprotein metabolism and other lipidemia: Secondary | ICD-10-CM

## 2021-03-24 DIAGNOSIS — Z20822 Contact with and (suspected) exposure to covid-19: Secondary | ICD-10-CM | POA: Diagnosis not present

## 2021-03-24 DIAGNOSIS — I5041 Acute combined systolic (congestive) and diastolic (congestive) heart failure: Secondary | ICD-10-CM | POA: Diagnosis not present

## 2021-03-24 DIAGNOSIS — J810 Acute pulmonary edema: Secondary | ICD-10-CM | POA: Diagnosis not present

## 2021-03-24 DIAGNOSIS — I5043 Acute on chronic combined systolic (congestive) and diastolic (congestive) heart failure: Secondary | ICD-10-CM | POA: Diagnosis not present

## 2021-03-24 DIAGNOSIS — Z79899 Other long term (current) drug therapy: Secondary | ICD-10-CM

## 2021-03-24 DIAGNOSIS — Z7982 Long term (current) use of aspirin: Secondary | ICD-10-CM | POA: Diagnosis not present

## 2021-03-24 DIAGNOSIS — D8685 Sarcoid myocarditis: Secondary | ICD-10-CM | POA: Diagnosis present

## 2021-03-24 DIAGNOSIS — Z881 Allergy status to other antibiotic agents status: Secondary | ICD-10-CM | POA: Diagnosis not present

## 2021-03-24 DIAGNOSIS — I700 Atherosclerosis of aorta: Secondary | ICD-10-CM | POA: Diagnosis not present

## 2021-03-24 DIAGNOSIS — R0602 Shortness of breath: Secondary | ICD-10-CM | POA: Diagnosis present

## 2021-03-24 DIAGNOSIS — I471 Supraventricular tachycardia: Secondary | ICD-10-CM | POA: Diagnosis not present

## 2021-03-24 DIAGNOSIS — E1169 Type 2 diabetes mellitus with other specified complication: Secondary | ICD-10-CM | POA: Diagnosis not present

## 2021-03-24 DIAGNOSIS — I447 Left bundle-branch block, unspecified: Secondary | ICD-10-CM | POA: Diagnosis present

## 2021-03-24 DIAGNOSIS — R000 Tachycardia, unspecified: Secondary | ICD-10-CM | POA: Diagnosis not present

## 2021-03-24 DIAGNOSIS — I10 Essential (primary) hypertension: Secondary | ICD-10-CM | POA: Diagnosis not present

## 2021-03-24 HISTORY — DX: Sarcoidosis, unspecified: D86.9

## 2021-03-24 HISTORY — DX: Type 2 diabetes mellitus without complications: E11.9

## 2021-03-24 LAB — TROPONIN I (HIGH SENSITIVITY)
Troponin I (High Sensitivity): 14 ng/L (ref <18)
Troponin I (High Sensitivity): 34 ng/L — ABNORMAL HIGH (ref <18)
Troponin I (High Sensitivity): 51 ng/L — ABNORMAL HIGH (ref <18)
Troponin I (High Sensitivity): 46 ng/L — ABNORMAL HIGH (ref <18)

## 2021-03-24 LAB — COMPREHENSIVE METABOLIC PANEL
ALT: 21 U/L (ref 0–44)
AST: 25 U/L (ref 15–41)
Albumin: 4.2 g/dL (ref 3.5–5.0)
Alkaline Phosphatase: 45 U/L (ref 38–126)
Anion gap: 9 (ref 5–15)
BUN: 18 mg/dL (ref 8–23)
CO2: 24 mmol/L (ref 22–32)
Calcium: 9.4 mg/dL (ref 8.9–10.3)
Chloride: 106 mmol/L (ref 98–111)
Creatinine, Ser: 0.73 mg/dL (ref 0.44–1.00)
GFR, Estimated: >60 mL/min (ref >60)
Glucose, Bld: 247 mg/dL — ABNORMAL HIGH (ref 70–99)
Potassium: 3.7 mmol/L (ref 3.5–5.1)
Sodium: 139 mmol/L (ref 135–145)
Total Bilirubin: 1 mg/dL (ref 0.3–1.2)
Total Protein: 6.9 g/dL (ref 6.5–8.1)

## 2021-03-24 LAB — CBC WITH DIFFERENTIAL/PLATELET
Abs Immature Granulocytes: 0.02 10*3/uL (ref 0.00–0.07)
Basophils Absolute: 0 10*3/uL (ref 0.0–0.1)
Basophils Relative: 1 %
Eosinophils Absolute: 0.2 10*3/uL (ref 0.0–0.5)
Eosinophils Relative: 3 %
HCT: 39.6 % (ref 36.0–46.0)
Hemoglobin: 14 g/dL (ref 12.0–15.0)
Immature Granulocytes: 0 %
Lymphocytes Relative: 24 %
Lymphs Abs: 1.8 10*3/uL (ref 0.7–4.0)
MCH: 31.3 pg (ref 26.0–34.0)
MCHC: 35.4 g/dL (ref 30.0–36.0)
MCV: 88.6 fL (ref 80.0–100.0)
Monocytes Absolute: 0.5 10*3/uL (ref 0.1–1.0)
Monocytes Relative: 6 %
Neutro Abs: 5 10*3/uL (ref 1.7–7.7)
Neutrophils Relative %: 66 %
Platelets: 138 10*3/uL — ABNORMAL LOW (ref 150–400)
RBC: 4.47 MIL/uL (ref 3.87–5.11)
RDW: 13.3 % (ref 11.5–15.5)
WBC: 7.5 10*3/uL (ref 4.0–10.5)
nRBC: 0 % (ref 0.0–0.2)

## 2021-03-24 LAB — TYPE AND SCREEN
ABO/RH(D): A NEG
Antibody Screen: NEGATIVE

## 2021-03-24 LAB — RESP PANEL BY RT-PCR (FLU A&B, COVID) ARPGX2
Influenza A by PCR: NEGATIVE
Influenza B by PCR: NEGATIVE
SARS Coronavirus 2 by RT PCR: NEGATIVE

## 2021-03-24 LAB — ECHOCARDIOGRAM COMPLETE
AR max vel: 1.99 cm2
AV Area VTI: 2.23 cm2
AV Area mean vel: 1.8 cm2
AV Mean grad: 4 mmHg
AV Peak grad: 8.3 mmHg
Ao pk vel: 1.44 m/s
Area-P 1/2: 5.09 cm2
Calc EF: 37 %
Height: 66.5 in
MV VTI: 1.98 cm2
S' Lateral: 4.7 cm
Single Plane A2C EF: 42.8 %
Single Plane A4C EF: 32.2 %
Weight: 3360 oz

## 2021-03-24 LAB — PROTIME-INR
INR: 1 (ref 0.8–1.2)
Prothrombin Time: 13 seconds (ref 11.4–15.2)

## 2021-03-24 LAB — LACTIC ACID, PLASMA: Lactic Acid, Venous: 1.8 mmol/L (ref 0.5–1.9)

## 2021-03-24 LAB — BRAIN NATRIURETIC PEPTIDE: B Natriuretic Peptide: 1119 pg/mL — ABNORMAL HIGH (ref 0.0–100.0)

## 2021-03-24 LAB — MAGNESIUM: Magnesium: 1.8 mg/dL (ref 1.7–2.4)

## 2021-03-24 LAB — D-DIMER, QUANTITATIVE: D-Dimer, Quant: 0.38 ug/mL-FEU (ref 0.00–0.50)

## 2021-03-24 LAB — TSH: TSH: 4.613 u[IU]/mL — ABNORMAL HIGH (ref 0.350–4.500)

## 2021-03-24 MED ORDER — ACETAMINOPHEN 325 MG PO TABS
650.0000 mg | ORAL_TABLET | ORAL | Status: DC | PRN
  Administered 2021-03-24 – 2021-03-25 (×2): 650 mg via ORAL
  Filled 2021-03-24 (×2): qty 2

## 2021-03-24 MED ORDER — ONDANSETRON HCL 4 MG/2ML IJ SOLN
4.0000 mg | Freq: Four times a day (QID) | INTRAMUSCULAR | Status: DC | PRN
  Administered 2021-03-24: 4 mg via INTRAVENOUS
  Filled 2021-03-24: qty 2

## 2021-03-24 MED ORDER — FUROSEMIDE 10 MG/ML IJ SOLN
20.0000 mg | Freq: Once | INTRAMUSCULAR | Status: AC
  Administered 2021-03-24: 20 mg via INTRAVENOUS
  Filled 2021-03-24: qty 2

## 2021-03-24 MED ORDER — HEPARIN BOLUS VIA INFUSION: 4000.0000 [IU] | Freq: Once | INTRAVENOUS | Status: DC

## 2021-03-24 MED ORDER — ASPIRIN 325 MG PO TABS: 325.0000 mg | ORAL_TABLET | Freq: Every day | ORAL | Status: DC

## 2021-03-24 MED ORDER — SODIUM CHLORIDE 0.9% FLUSH: 3.0000 mL | INTRAVENOUS | Status: DC | PRN

## 2021-03-24 MED ORDER — AMIODARONE HCL IN DEXTROSE 360-4.14 MG/200ML-% IV SOLN
60.0000 mg/h | INTRAVENOUS | Status: AC
  Administered 2021-03-24: 60 mg/h via INTRAVENOUS
  Filled 2021-03-24: qty 200

## 2021-03-24 MED ORDER — SODIUM CHLORIDE 0.9% FLUSH
3.0000 mL | Freq: Two times a day (BID) | INTRAVENOUS | Status: DC
  Administered 2021-03-24: 3 mL via INTRAVENOUS

## 2021-03-24 MED ORDER — ASPIRIN EC 81 MG PO TBEC: 243.0000 mg | DELAYED_RELEASE_TABLET | Freq: Once | ORAL | Status: DC

## 2021-03-24 MED ORDER — ASPIRIN EC 81 MG PO TBEC
81.0000 mg | DELAYED_RELEASE_TABLET | Freq: Every day | ORAL | Status: DC
  Administered 2021-03-24: 81 mg via ORAL
  Filled 2021-03-24: qty 1

## 2021-03-24 MED ORDER — HEPARIN (PORCINE) 25000 UT/250ML-% IV SOLN: 1200.0000 [IU]/h | INTRAVENOUS | Status: DC

## 2021-03-24 MED ORDER — CHLORHEXIDINE GLUCONATE CLOTH 2 % EX PADS
6.0000 | MEDICATED_PAD | Freq: Every day | CUTANEOUS | Status: DC
  Administered 2021-03-24 – 2021-03-25 (×2): 6 via TOPICAL

## 2021-03-24 MED ORDER — SODIUM CHLORIDE 0.9 % IV SOLN: 250.0000 mL | INTRAVENOUS | Status: DC | PRN

## 2021-03-24 MED ORDER — AMIODARONE HCL IN DEXTROSE 360-4.14 MG/200ML-% IV SOLN
30.0000 mg/h | INTRAVENOUS | Status: DC
  Administered 2021-03-24 (×2): 30 mg/h via INTRAVENOUS
  Filled 2021-03-24 (×2): qty 200

## 2021-03-24 MED ORDER — ENOXAPARIN SODIUM 100 MG/ML IJ SOSY
100.0000 mg | PREFILLED_SYRINGE | Freq: Two times a day (BID) | INTRAMUSCULAR | Status: DC
  Administered 2021-03-24 – 2021-03-25 (×3): 100 mg via SUBCUTANEOUS
  Filled 2021-03-24 (×4): qty 1

## 2021-03-24 NOTE — ED Notes (Signed)
ED Provider at bedside. 

## 2021-03-24 NOTE — Progress Notes (Signed)
*  PRELIMINARY RESULTS* Echocardiogram 2D Echocardiogram has been performed.  [name] 03/24/2021, 10:52 AM

## 2021-03-24 NOTE — ED Triage Notes (Signed)
Pt c/o cough and SOB that started this morning at 0500. Pt reports she felt normal prior to going to bed. Daughter arrived at pt's house this morning and heard pt wheezing. No hx of asthma or COPD.

## 2021-03-24 NOTE — Progress Notes (Signed)
ANTICOAGULATION CONSULT NOTE - Initial Consult  Pharmacy Consult for Heparin Indication: atrial fibrillation  Allergies  Allergen Reactions   Macrobid [Nitrofurantoin Macrocrystal] Nausea And Vomiting   Codeine Nausea And Vomiting   Sulfa Antibiotics Other (See Comments)    Pt cannot remember   Ciprofloxacin     Decreased BP    Patient Measurements: Height: 5' 6.5" (168.9 cm) Weight: 95.3 kg (210 lb) IBW/kg (Calculated) : 60.45 HEPARIN DW (KG): 81.5   Vital Signs: Temp: 97.9 F (36.6 C) (09/28 0715) Temp Source: Oral (09/28 0715) BP: 122/107 (09/28 0800) Pulse Rate: 110 (09/28 0800)  Labs: Recent Labs    03/24/21 0722  HGB 14.0  HCT 39.6  PLT 138*  LABPROT 13.0  INR 1.0  CREATININE 0.73  TROPONINIHS 14    Estimated Creatinine Clearance: 75.8 mL/min (by C-G formula based on SCr of 0.73 mg/dL).   Medical History: Past Medical History:  Diagnosis Date   Atypical nevi 49/12/261   Complication of anesthesia    Diabetes mellitus without complication (HCC)    Glaucoma    Hyperlipidemia    Hypertension    PONV (postoperative nausea and vomiting)    Proximal humerus fracture    right   Vaginal dryness 04/01/2014   Varicose veins     Medications:  See  med rec  Assessment: Patient presented to ED with SOB. Pharmacy asked to dose heparin for new onset afib. Patient not on any oral anticoagulants  Goal of Therapy:  Heparin level 0.3-0.7 units/ml Monitor platelets by anticoagulation protocol: Yes   Plan:  Give 4000 units bolus x 1 Start heparin infusion at 1200 units/hr Check anti-Xa level in ~8 hours and daily while on heparin Continue to monitor H&H and platelets  Isac Sarna, BS [place]ena [place], BCPS Clinical Pharmacist Pager [phone] 03/24/2021,8:38 AM

## 2021-03-24 NOTE — H&P (Addendum)
[facility] History and Physical  [name] [name]:[id number] DOB: 1950/07/29 DOA: 03/24/2021  Referring physician:  PCP: Asencion Noble, MD   Chief Complaint: SOB  HPI: [name] is a 70 y.o. WF PMHx DM type II, essential HTN, HLD,  Complaining of shortness of breath that started around 5 this morning.  She said she was up and reading in her chair when it acutely happened.  Daughter came and found her to be wheezing.  No history of any pulmonary problems.  Patient has some chronic neuropathy pain in her feet.  Daughter feels she is more pale than normal.  Patient denies any headache chest pain abdominal pain vomiting diarrhea or urinary symptoms.  She has had a mild cough.    Review of Systems:  Covid vaccination; vaccinated 2/3  Constitutional:  No weight loss, night sweats, Fevers, chills, fatigue.  HEENT:  No headaches, Difficulty swallowing,Tooth/dental problems,Sore throat,  No sneezing, itching, ear ache, nasal congestion, post nasal drip,  Cardio-vascular:  No chest pain, Orthopnea, PND, swelling in lower extremities, anasarca, dizziness, palpitations  GI:  No heartburn, indigestion, abdominal pain, nausea, vomiting, diarrhea, change in bowel habits, loss of appetite  Resp:  No shortness of breath with exertion or at rest. No excess mucus, no productive cough, No non-productive cough, No coughing up of blood.No change in color of mucus.No wheezing.No chest wall deformity  Skin:  no rash or lesions.  GU:  no dysuria, change in color of urine, no urgency or frequency. No flank pain.  Musculoskeletal:  No joint pain or swelling. No decreased range of motion. No back pain.  Psych:  No change in mood or affect. No depression or anxiety. No memory loss.   Past Medical History:  Diagnosis Date   Atypical nevi 56/08/8754   Complication of anesthesia    Diabetes mellitus without complication (HCC)    Glaucoma    Hyperlipidemia    Hypertension    PONV (postoperative  nausea and vomiting)    Proximal humerus fracture    right   Vaginal dryness 04/01/2014   Varicose veins    Past Surgical History:  Procedure Laterality Date   APPENDECTOMY     BIOPSY  09/27/2017   Procedure: BIOPSY;  Surgeon: Daneil Dolin, MD;  [facility]: [facility];  Service: Endoscopy;;  ileocecal valve   CESAREAN SECTION     X 3   COLONOSCOPY     COLONOSCOPY  07/23/2012   Procedure: COLONOSCOPY;  Surgeon: Daneil Dolin, MD;  Location: [facility];  Service: Endoscopy;  Laterality: N/A;  7:30 AM   COLONOSCOPY N/A 09/27/2017   Procedure: COLONOSCOPY;  Surgeon: Daneil Dolin, MD;  Location: [facility];  Service: Endoscopy;  Laterality: N/A;  8:30   TONSILLECTOMY     TUBAL LIGATION     Social History:  reports that she has never smoked. She has never used smokeless tobacco. She reports that she does not drink alcohol and does not use drugs.  Allergies  Allergen Reactions   Macrobid [Nitrofurantoin Macrocrystal] Nausea And Vomiting   Codeine Nausea And Vomiting   Sulfa Antibiotics Other (See Comments)    Pt cannot remember   Ciprofloxacin     Decreased BP    Family History  Problem Relation Age of Onset   Varicose Veins Mother    Deep vein thrombosis Mother    Cancer Father    Varicose Veins Father    Deep vein thrombosis Father    Diabetes Father  Heart disease Father    Hypertension Father    Diabetes Sister    Heart disease Sister    Hyperlipidemia Sister    Hypertension Sister    Hypertension Son    Colon cancer Brother    Cancer Brother        stomach   Varicose Veins Daughter    Hypertension Son    Miscarriages / [place] Daughter     Prior to Admission medications   Medication Sig Start Date End Date Taking? Authorizing Provider  aspirin EC 81 MG tablet Take 81 mg by mouth at bedtime.   Yes [provider]  atorvastatin (LIPITOR) 10 MG tablet Take 10 mg by mouth at bedtime.    Yes [provider]  brimonidine-timolol  (COMBIGAN) 0.2-0.5 % ophthalmic solution INSTILL ONE DROP IN BOTH  EYES TWO TIMES DAILY 10/28/16  Yes [provider]  Clobetasol Prop Emollient Base 0.05 % emollient cream Use bid to affected areas for 2 weeks then 3 x a week 11/20/19  Yes Derrek Monaco A, NP  dorzolamide (TRUSOPT) 2 % ophthalmic solution Place 1 drop into both eyes 2 (two) times daily. 07/08/17  Yes [provider]  gabapentin (NEURONTIN) 300 MG capsule Take 300 mg by mouth daily. 02/18/21  Yes [provider]  glimepiride (AMARYL) 1 MG tablet Take 1 mg by mouth daily before breakfast.    Yes [provider]  latanoprost (XALATAN) 0.005 % ophthalmic solution Place 1 drop into both eyes 2 (two) times daily.   Yes [provider]  liraglutide (VICTOZA) 18 MG/3ML SOPN Inject 1.2 mg into the skin at bedtime.    Yes [provider]  losartan (COZAAR) 100 MG tablet Take 100 mg by mouth daily. 07/15/19  Yes [provider]  metFORMIN (GLUCOPHAGE) 1000 MG tablet Take 1,000 mg by mouth 2 (two) times daily.   Yes [provider]  Multiple Vitamin (MULTIVITAMIN WITH MINERALS) TABS tablet Take 1 tablet by mouth at bedtime.   Yes [provider]  amLODipine (NORVASC) 2.5 MG tablet Take 2.5 mg by mouth daily. Patient not taking: Reported on 03/24/2021 07/29/19   [provider]  chlorthalidone (HYGROTON) 25 MG tablet Take 25 mg by mouth daily. Patient not taking: Reported on 03/24/2021 01/29/21   [provider]     Consultants:    Procedures/Significant Events:  9/28 Echocardiogram:Left Ventricle: LVEF 25 to 30%. The left ventricle has severely decreased function. The left ventricle demonstrates regional wall motion abnormalities.  -Moderate left ventricular hypertrophy.  -Left ventricular diastolic parameters are consistent with Grade II diastolic dysfunction  -mid anterolateral segment, and mid inferoseptal segment are hypokinetic.  -Right  Ventricle: The right ventricular size is mildly enlarged.  -Left Atrium: mild to moderately dilated.  -Right Atrium: mild to moderately dilated.    I have personally reviewed and interpreted all radiology studies and my findings are as above.   VENTILATOR SETTINGS:    Cultures 9/28 SARS coronavirus negative   Antimicrobials:    Devices    LINES / TUBES:      Continuous Infusions:  sodium chloride     amiodarone 30 mg/hr (03/24/21 1621)    Physical Exam: Vitals:   03/24/21 1500 03/24/21 1600 03/24/21 1630 03/24/21 1700  BP: 122/67     Pulse: 85 87 82 (!) 38  Resp: 16 17 13 12   Temp:   98.1 F (36.7 C)   TempSrc:   Oral   SpO2: 95% 94% 93% 95%  Weight:  Height:        Wt Readings from Last 3 Encounters:  03/24/21 95.4 kg  01/06/21 98.9 kg  11/25/20 100.9 kg    General: A/O x4, No acute respiratory distress Eyes: negative scleral hemorrhage, negative anisocoria, negative icterus ENT: Negative Runny nose, negative gingival bleeding, Neck:  Negative scars, masses, torticollis, lymphadenopathy, JVD Lungs: Clear to auscultation bilaterally without wheezes or crackles Cardiovascular: Irregularly irregular rhythm and rate without murmur gallop or rub normal S1 and S2 Abdomen: negative abdominal pain, nondistended, positive soft, bowel sounds, no rebound, no ascites, no appreciable mass Extremities: No significant cyanosis, clubbing, or edema bilateral lower extremities Skin: Negative rashes, lesions, ulcers Psychiatric:  Negative depression, negative anxiety, negative fatigue, negative mania  Central nervous system:  Cranial nerves II through XII intact, tongue/uvula midline, all extremities muscle strength 5/5, sensation intact throughout, negative dysarthria, negative expressive aphasia, negative receptive aphasia.        Labs on Admission:  Basic Metabolic Panel: Recent Labs  Lab 03/24/21 0722  NA 139  K 3.7  CL 106  CO2 24  GLUCOSE 247*  BUN  18  CREATININE 0.73  CALCIUM 9.4  MG 1.8   Liver Function Tests: Recent Labs  Lab 03/24/21 0722  AST 25  ALT 21  ALKPHOS 45  BILITOT 1.0  PROT 6.9  ALBUMIN 4.2   No results for input(s): LIPASE, AMYLASE in the last 168 hours. No results for input(s): AMMONIA in the last 168 hours. CBC: Recent Labs  Lab 03/24/21 0722  WBC 7.5  NEUTROABS 5.0  HGB 14.0  HCT 39.6  MCV 88.6  PLT 138*   Cardiac Enzymes: No results for input(s): CKTOTAL, CKMB, CKMBINDEX, TROPONINI in the last 168 hours.  BNP (last 3 results) Recent Labs    03/24/21 0722  BNP 1,119.0*    ProBNP (last 3 results) No results for input(s): PROBNP in the last 8760 hours.  CBG: No results for input(s): GLUCAP in the last 168 hours.  Radiological Exams on Admission: DG Chest Port 1 View  Result Date: 03/24/2021 CLINICAL DATA:  sob EXAM: PORTABLE CHEST 1 VIEW COMPARISON:  None. FINDINGS: The heart appears enlarged, which may be exaggerated due to projection. No pleural effusion. No pneumothorax. Increased interstitial markings. No lobar consolidation. No acute osseous abnormality. IMPRESSION: The heart appears enlarged with increased interstitial markings, suggestive of mild pulmonary edema. Electronically Signed   By: Albin Felling M.D.   On: 03/24/2021 08:23   ECHOCARDIOGRAM COMPLETE  Result Date: 03/24/2021    ECHOCARDIOGRAM REPORT   Patient Name:   [name] Date of Exam: 03/24/2021 Medical Rec #:  [id number]    Height:       66.5 in Accession #:    [id number]   Weight:       210.0 lb Date of Birth:  February 25, 1951   BSA:          2.053 m Patient Age:    31 years     BP:           122/107 mmHg Patient Gender: F            HR:           110 bpm. Exam Location:  [facility] Procedure: 2D Echo, Cardiac Doppler and Color Doppler Indications:    CHF-Acute Systolic  History:        Patient has no prior history of Echocardiogram examinations.  CHF, Arrythmias:Atrial Fibrillation; Signs/Symptoms:Shortness  of                 Breath.  Sonographer:    Wenda Low Referring Phys: [id number] [place]  1. Left ventricular ejection fraction, by estimation, is 25 to 30%. The left ventricle has severely decreased function. The left ventricle demonstrates regional wall motion abnormalities (see scoring diagram/findings for description - diffuse hypokinesis with relative preservation of basal contraction, although not clearly stress-induced cardiomyopathy pattern). The left ventricular internal cavity size was mildly dilated. There is moderate left ventricular hypertrophy. Left ventricular diastolic parameters are consistent with Grade II diastolic dysfunction (pseudonormalization).  2. Right ventricular systolic function is moderately reduced. The right ventricular size is mildly enlarged. There is mildly elevated pulmonary artery systolic pressure. The estimated right ventricular systolic pressure is 09.6 mmHg.  3. Left atrial size was mild to moderately dilated.  4. Right atrial size was mild to moderately dilated.  5. There is a trivial pericardial effusion posterior to the left ventricle.  6. The mitral valve is grossly normal. Mild mitral valve regurgitation. The mean mitral valve gradient is 2.0 mmHg.  7. The aortic valve is tricuspid. Aortic valve regurgitation is not visualized. Mild aortic valve sclerosis is present, with no evidence of aortic valve stenosis. Aortic valve mean gradient measures 4.0 mmHg. Comparison(s): No prior Echocardiogram. FINDINGS  Left Ventricle: Left ventricular ejection fraction, by estimation, is 25 to 30%. The left ventricle has severely decreased function. The left ventricle demonstrates regional wall motion abnormalities. The left ventricular internal cavity size was mildly  dilated. There is moderate left ventricular hypertrophy. Left ventricular diastolic parameters are consistent with Grade II diastolic dysfunction (pseudonormalization).  LV Wall Scoring: The mid and  distal anterior wall, mid and distal lateral wall, mid and distal anterior septum, entire apex, mid and distal inferior wall, posterior wall, mid anterolateral segment, and mid inferoseptal segment are hypokinetic. The basal anteroseptal segment, basal anterolateral segment, basal anterior segment, basal inferior segment, and basal inferoseptal segment are normal. Right Ventricle: The right ventricular size is mildly enlarged. No increase in right ventricular wall thickness. Right ventricular systolic function is moderately reduced. There is mildly elevated pulmonary artery systolic pressure. The tricuspid regurgitant velocity is 2.92 m/s, and with an assumed right atrial pressure of 8 mmHg, the estimated right ventricular systolic pressure is 28.3 mmHg. Left Atrium: Left atrial size was mild to moderately dilated. Right Atrium: Right atrial size was mild to moderately dilated. Pericardium: Trivial pericardial effusion is present. The pericardial effusion is posterior to the left ventricle. Mitral Valve: The mitral valve is grossly normal. Mild to moderate mitral annular calcification. Mild mitral valve regurgitation. MV peak gradient, 3.3 mmHg. The mean mitral valve gradient is 2.0 mmHg. Tricuspid Valve: The tricuspid valve is grossly normal. Tricuspid valve regurgitation is mild. Aortic Valve: The aortic valve is tricuspid. There is mild aortic valve annular calcification. Aortic valve regurgitation is not visualized. Mild aortic valve sclerosis is present, with no evidence of aortic valve stenosis. Aortic valve mean gradient measures 4.0 mmHg. Aortic valve peak gradient measures 8.3 mmHg. Aortic valve area, by VTI measures 2.23 cm. Pulmonic Valve: The pulmonic valve was grossly normal. Pulmonic valve regurgitation is trivial. Aorta: The aortic root is normal in size and structure. IAS/Shunts: No atrial level shunt detected by color flow Doppler.  LEFT VENTRICLE PLAX 2D LVIDd:         5.60 cm LVIDs:         4.70  cm LV PW:         1.30 cm LV IVS:        1.40 cm LVOT diam:     2.00 cm LV SV:         56 LV SV Index:   27 LVOT Area:     3.14 cm  LV Volumes (MOD) LV vol d, MOD A2C: 102.0 ml LV vol d, MOD A4C: 133.0 ml LV vol s, MOD A2C: 58.3 ml LV vol s, MOD A4C: 90.2 ml LV SV MOD A2C:     43.7 ml LV SV MOD A4C:     133.0 ml LV SV MOD BP:      44.1 ml RIGHT VENTRICLE RV Basal diam:  4.40 cm RV Mid diam:    4.70 cm RV S prime:     20.40 cm/s TAPSE (M-mode): 2.2 cm LEFT ATRIUM              Index       RIGHT ATRIUM           Index LA diam:        4.70 cm  2.29 cm/m  RA Area:     24.40 cm LA Vol (A2C):   128.0 ml 62.34 ml/m RA Volume:   89.20 ml  43.44 ml/m LA Vol (A4C):   81.2 ml  39.55 ml/m LA Biplane Vol: 101.0 ml 49.19 ml/m  AORTIC VALVE                   PULMONIC VALVE AV Area (Vmax):    1.99 cm    PV Vmax:       0.66 m/s AV Area (Vmean):   1.80 cm    PV Peak grad:  1.8 mmHg AV Area (VTI):     2.23 cm AV Vmax:           144.00 cm/s AV Vmean:          94.033 cm/s AV VTI:            0.249 m AV Peak Grad:      8.3 mmHg AV Mean Grad:      4.0 mmHg LVOT Vmax:         91.37 cm/s LVOT Vmean:        53.900 cm/s LVOT VTI:          0.177 m LVOT/AV VTI ratio: 0.71  AORTA Ao Root diam: 3.30 cm Ao Asc diam:  3.50 cm MITRAL VALVE               TRICUSPID VALVE MV Area (PHT): 5.09 cm    TR Peak grad:   34.1 mmHg MV Area VTI:   1.98 cm    TR Vmax:        292.00 cm/s MV Peak grad:  3.3 mmHg MV Mean grad:  2.0 mmHg    SHUNTS MV Vmax:       0.90 m/s    Systemic VTI:  0.18 m MV Vmean:      58.8 cm/s   Systemic Diam: 2.00 cm MV Decel Time: 149 msec MV E velocity: 80.60 cm/s MV A velocity: 72.40 cm/s MV E/A ratio:  1.11 Rozann Lesches MD Electronically signed by Rozann Lesches MD Signature Date/Time: 03/24/2021/11:31:13 AM    Final     EKG: Independently reviewed.  LBBB, A. fib  Assessment/Plan Active Problems:   Atrial fibrillation, new onset (HCC)   SOB (shortness of breath)   Acute CHF (congestive heart  failure) (HCC)    Systolic and diastolic CHF, acute ([place])  Acute systolic and diastolic CHF/LBBB - Strict in and out - Daily weight - 9/28 cardiology consult placed in epic.  Also spoke with [facility] will on call cardiologist who will ensure that patient is seen by cardiology in the a.m.  Thinking is that she needs a cardiac cath but will allow local cardiologist to determine exact timing.  A. fib with RVR -Patient going into and out of A. fib and RVR, given patient's significantly elevated BNP worried she may have undiagnosed structural damage. - 9/28 Amiodarone drip -Full dose Lovenox (to limit patient's fluid intake)  SOB - Secondary to above.  Resolved - Titrate O2 to maintain SPO2> 92%    Code Status: Full (DVT Prophylaxis: Lovenox full dose Family Communication: 9/28 daughter at bedside for discussion of plan of care all questions answered Status is: Inpatient    Dispo: The patient is from: Home              Anticipated d/c is to: Home              Anticipated d/c date is: 3 days              Patient currently is not medically stable to d/c.     Data Reviewed: Care during the described time interval was provided by me .  I have reviewed this patient's available data, including medical history, events of note, physical examination, and all test results as part of my evaluation.   The patient is critically ill with multiple organ systems failure and requires high complexity decision making for assessment and support, frequent evaluation and titration of therapies, application of advanced monitoring technologies and extensive interpretation of multiple databases. Critical Care Time devoted to patient care services described in this note  Time spent: 70 minutes   Fabienne Nolasco, [facility]

## 2021-03-24 NOTE — Progress Notes (Signed)
ANTICOAGULATION CONSULT NOTE - Initial Consult  Pharmacy Consult for Heparin--> lovenox Indication: atrial fibrillation  Allergies  Allergen Reactions   Macrobid [Nitrofurantoin Macrocrystal] Nausea And Vomiting   Codeine Nausea And Vomiting   Sulfa Antibiotics Other (See Comments)    Pt cannot remember   Ciprofloxacin     Decreased BP    Patient Measurements: Height: 5' 6.5" (168.9 cm) Weight: 95.3 kg (210 lb) IBW/kg (Calculated) : 60.45 HEPARIN DW (KG): 81.5   Vital Signs: Temp: 97.9 F (36.6 C) (09/28 0715) Temp Source: Oral (09/28 0715) BP: 122/107 (09/28 0800) Pulse Rate: 110 (09/28 0800)  Labs: Recent Labs    03/24/21 0722  HGB 14.0  HCT 39.6  PLT 138*  LABPROT 13.0  INR 1.0  CREATININE 0.73  TROPONINIHS 14     Estimated Creatinine Clearance: 75.8 mL/min (by C-G formula based on SCr of 0.73 mg/dL).   Medical History: Past Medical History:  Diagnosis Date   Atypical nevi 82/01/33   Complication of anesthesia    Diabetes mellitus without complication (HCC)    Glaucoma    Hyperlipidemia    Hypertension    PONV (postoperative nausea and vomiting)    Proximal humerus fracture    right   Vaginal dryness 04/01/2014   Varicose veins     Medications:  See  med rec  Assessment: Patient presented to ED with SOB. Pharmacy asked to dose Lovenox for new onset afib. Patient not on any oral anticoagulants  Goal of Therapy:  Monitor platelets by anticoagulation protocol: Yes   Plan:  Lovneox 1mg /kg (100mg ) sq q12h Continue to monitor H&H and platelets  Isac Sarna, BS Vena [place], BCPS Clinical Pharmacist Pager [phone] 03/24/2021,9:45 AM

## 2021-03-24 NOTE — ED Provider Notes (Signed)
[facility] EMERGENCY DEPARTMENT Provider Note   CSN: [id number] Arrival date & time: 03/24/21  5329     History Chief Complaint  Patient presents with   Shortness of Breath    [name] is a 70 y.o. female.  She has a history of diabetes hypertension hyperlipidemia.  Complaining of shortness of breath that started around 5 this morning.  She said she was up and reading in her chair when it acutely happened.  Daughter came and found her to be wheezing.  No history of any pulmonary problems.  Patient has some chronic neuropathy pain in her feet.  Daughter feels she is more pale than normal.  Patient denies any headache chest pain abdominal pain vomiting diarrhea or urinary symptoms.  She has had a mild cough.  The history is provided by the patient.  Shortness of Breath Severity:  Moderate Onset quality:  Sudden Duration:  2 hours Timing:  Constant Progression:  Unchanged Chronicity:  New Relieved by:  None tried Worsened by:  Nothing Ineffective treatments:  None tried Associated symptoms: cough, diaphoresis and wheezing   Associated symptoms: no abdominal pain, no chest pain, no fever, no headaches, no hemoptysis, no rash, no sore throat, no sputum production and no syncope       Past Medical History:  Diagnosis Date   Atypical nevi 92/09/2681   Complication of anesthesia    Diabetes mellitus without complication ([place])    Glaucoma    Hyperlipidemia    Hypertension    PONV (postoperative nausea and vomiting)    Proximal humerus fracture    right   Vaginal dryness 04/01/2014   Varicose veins     Patient Active Problem List   Diagnosis Date Noted   Injury of breast 01/06/2021   Mass of upper outer quadrant of right breast 01/06/2021   Memory difficulties 01/06/2021   Encounter for screening fecal occult blood testing 11/25/2020   Encounter for well woman exam with routine gynecological exam 11/25/2020   Screening for colorectal cancer 11/20/2019   Encounter for  gynecological examination with Papanicolaou smear of cervix 11/20/2019   Routine cervical smear 11/20/2019   Vulvar dystrophy 41/96/2229   Lichen sclerosus et atrophicus 12/02/2016   Encounter for colorectal cancer screening 12/02/2016   Vaginal dryness 04/01/2014   Atypical nevi 04/01/2014   Varicose veins of leg with pain 03/27/2014    Past Surgical History:  Procedure Laterality Date   APPENDECTOMY     BIOPSY  09/27/2017   Procedure: BIOPSY;  Surgeon: Daneil Dolin, MD;  Location: [facility];  Service: Endoscopy;;  ileocecal valve   CESAREAN SECTION     X 3   COLONOSCOPY     COLONOSCOPY  07/23/2012   Procedure: COLONOSCOPY;  Surgeon: Daneil Dolin, MD;  Location: [facility];  Service: Endoscopy;  Laterality: N/A;  7:30 AM   COLONOSCOPY N/A 09/27/2017   Procedure: COLONOSCOPY;  Surgeon: Daneil Dolin, MD;  Location: [facility];  Service: Endoscopy;  Laterality: N/A;  8:30   TONSILLECTOMY     TUBAL LIGATION       OB History     Gravida  4   Para  4   Term  3   Preterm  1   AB      Living  3      SAB      IAB      Ectopic      Multiple      Live Births  3           Family History  Problem Relation Age of Onset   Varicose Veins Mother    Deep vein thrombosis Mother    Cancer Father    Varicose Veins Father    Deep vein thrombosis Father    Diabetes Father    Heart disease Father    Hypertension Father    Diabetes Sister    Heart disease Sister    Hyperlipidemia Sister    Hypertension Sister    Hypertension Son    Colon cancer Brother    Cancer Brother        stomach   Varicose Veins Daughter    Hypertension Son    11 / [place] Daughter     Social History   Tobacco Use   Smoking status: Never   Smokeless tobacco: Never  Vaping Use   Vaping Use: Never used  Substance Use Topics   Alcohol use: No   Drug use: No    Home Medications Prior to Admission medications   Medication Sig Start Date End Date Taking?  Authorizing Provider  amLODipine (NORVASC) 2.5 MG tablet Take 2.5 mg by mouth daily. Patient not taking: Reported on 01/06/2021 07/29/19   [provider]  aspirin EC 81 MG tablet Take 81 mg by mouth at bedtime.    [provider]  atorvastatin (LIPITOR) 10 MG tablet Take 10 mg by mouth at bedtime.     [provider]  brimonidine-timolol (COMBIGAN) 0.2-0.5 % ophthalmic solution INSTILL ONE DROP IN BOTH  EYES TWO TIMES DAILY 10/28/16   [provider]  Clobetasol Prop Emollient Base 0.05 % emollient cream Use bid to affected areas for 2 weeks then 3 x a week 11/20/19   Derrek Monaco A, NP  dorzolamide (TRUSOPT) 2 % ophthalmic solution Place 1 drop into both eyes 2 (two) times daily. 07/08/17   [provider]  gabapentin (NEURONTIN) 100 MG capsule Take 200 mg by mouth at bedtime.    [provider]  glimepiride (AMARYL) 1 MG tablet Take 1 mg by mouth daily before breakfast.     [provider]  latanoprost (XALATAN) 0.005 % ophthalmic solution Place 1 drop into both eyes 2 (two) times daily.    [provider]  liraglutide (VICTOZA) 18 MG/3ML SOPN Inject 1.2 mg into the skin at bedtime.     [provider]  losartan (COZAAR) 100 MG tablet Take 100 mg by mouth daily. 07/15/19   [provider]  metFORMIN (GLUCOPHAGE) 1000 MG tablet Take 1,000 mg by mouth 2 (two) times daily.    [provider]  Multiple Vitamin (MULTIVITAMIN WITH MINERALS) TABS tablet Take 1 tablet by mouth at bedtime.    [provider]    Allergies    Macrobid [nitrofurantoin macrocrystal], Codeine, Sulfa antibiotics, and Ciprofloxacin  Review of Systems   Review of Systems  Constitutional:  Positive for diaphoresis. Negative for fever.  HENT:  Negative for sore throat.   Eyes:  Negative for visual disturbance.  Respiratory:  Positive for cough, shortness of breath and wheezing. Negative for hemoptysis and sputum  production.   Cardiovascular:  Negative for chest pain and syncope.  Gastrointestinal:  Negative for abdominal pain.  Genitourinary:  Negative for dysuria.  Musculoskeletal:  Negative for back pain.  Skin:  Negative for rash.  Neurological:  Negative for headaches.   Physical Exam Updated Vital Signs BP (!) 165/91 (BP Location: Right Arm)   Pulse (!) 110  Temp 97.9 F (36.6 C) (Oral)   Resp (!) 28   Ht 5' 6.5" (1.689 m)   Wt 95.3 kg   SpO2 92%   BMI 33.39 kg/m   Physical Exam Vitals and nursing note reviewed.  Constitutional:      General: She is not in acute distress.    Appearance: She is well-developed.  HENT:     Head: Normocephalic and atraumatic.  Eyes:     Conjunctiva/sclera: Conjunctivae normal.  Cardiovascular:     Rate and Rhythm: Tachycardia present. Rhythm irregular.     Heart sounds: No murmur heard. Pulmonary:     Effort: Pulmonary effort is normal. No respiratory distress.     Breath sounds: Examination of the right-upper field reveals rhonchi. Examination of the right-middle field reveals rhonchi. Rhonchi present.  Abdominal:     Palpations: Abdomen is soft.     Tenderness: There is no abdominal tenderness.  Musculoskeletal:        General: Normal range of motion.     Cervical back: Neck supple.     Right lower leg: No tenderness.     Left lower leg: No tenderness.  Skin:    General: Skin is warm and dry.  Neurological:     General: No focal deficit present.     Mental Status: She is alert.    ED Results / Procedures / Treatments   Labs (all labs ordered are listed, but only abnormal results are displayed) Labs Reviewed  COMPREHENSIVE METABOLIC PANEL - Abnormal; Notable for the following components:      Result Value   Glucose, Bld 247 (*)    All other components within normal limits  CBC WITH DIFFERENTIAL/PLATELET - Abnormal; Notable for the following components:   Platelets 138 (*)    All other components within normal limits  BRAIN  NATRIURETIC PEPTIDE - Abnormal; Notable for the following components:   B Natriuretic Peptide 1,119.0 (*)    All other components within normal limits  TSH - Abnormal; Notable for the following components:   TSH 4.613 (*)    All other components within normal limits  TROPONIN I (HIGH SENSITIVITY) - Abnormal; Notable for the following components:   Troponin I (High Sensitivity) 34 (*)    All other components within normal limits  RESP PANEL BY RT-PCR (FLU A&B, COVID) ARPGX2  MRSA NEXT GEN BY PCR, NASAL  LACTIC ACID, PLASMA  PROTIME-INR  D-DIMER, QUANTITATIVE  MAGNESIUM  CBC  COMPREHENSIVE METABOLIC PANEL  MAGNESIUM  TYPE AND SCREEN  TROPONIN I (HIGH SENSITIVITY)    EKG EKG Interpretation  Date/Time:  Wednesday March 24 2021 07:13:51 EDT Ventricular Rate:  110 PR Interval:    QRS Duration: 139 QT Interval:  371 QTC Calculation: 502 R Axis:   -63 Text Interpretation: Atrial fibrillation Ventricular bigeminy Left bundle branch block Baseline wander in lead(s) II III aVL aVF new afib compared with prior 2/21 Confirmed by Aletta Edouard [id number]) on 03/24/2021 7:27:51 AM  Radiology DG Chest Port 1 View  Result Date: 03/24/2021 CLINICAL DATA:  sob EXAM: PORTABLE CHEST 1 VIEW COMPARISON:  None. FINDINGS: The heart appears enlarged, which may be exaggerated due to projection. No pleural effusion. No pneumothorax. Increased interstitial markings. No lobar consolidation. No acute osseous abnormality. IMPRESSION: The heart appears enlarged with increased interstitial markings, suggestive of mild pulmonary edema. Electronically Signed   By: Albin Felling M.D.   On: 03/24/2021 08:23   ECHOCARDIOGRAM COMPLETE  Result Date: 03/24/2021    ECHOCARDIOGRAM REPORT  Patient Name:   MARRIAH SANDERLIN Date of Exam: 03/24/2021 Medical Rec #:  [id number]    Height:       66.5 in Accession #:    [id number]   Weight:       210.0 lb Date of Birth:  1950-12-11   BSA:          2.053 m Patient Age:    25  years     BP:           122/107 mmHg Patient Gender: F            HR:           110 bpm. Exam Location:  [facility] Procedure: 2D Echo, Cardiac Doppler and Color Doppler Indications:    CHF-Acute Systolic  History:        Patient has no prior history of Echocardiogram examinations.                 CHF, Arrythmias:Atrial Fibrillation; Signs/Symptoms:Shortness of                 Breath.  Sonographer:    Wenda Low Referring Phys: [id number] [place]  1. Left ventricular ejection fraction, by estimation, is 25 to 30%. The left ventricle has severely decreased function. The left ventricle demonstrates regional wall motion abnormalities (see scoring diagram/findings for description - diffuse hypokinesis with relative preservation of basal contraction, although not clearly stress-induced cardiomyopathy pattern). The left ventricular internal cavity size was mildly dilated. There is moderate left ventricular hypertrophy. Left ventricular diastolic parameters are consistent with Grade II diastolic dysfunction (pseudonormalization).  2. Right ventricular systolic function is moderately reduced. The right ventricular size is mildly enlarged. There is mildly elevated pulmonary artery systolic pressure. The estimated right ventricular systolic pressure is 93.2 mmHg.  3. Left atrial size was mild to moderately dilated.  4. Right atrial size was mild to moderately dilated.  5. There is a trivial pericardial effusion posterior to the left ventricle.  6. The mitral valve is grossly normal. Mild mitral valve regurgitation. The mean mitral valve gradient is 2.0 mmHg.  7. The aortic valve is tricuspid. Aortic valve regurgitation is not visualized. Mild aortic valve sclerosis is present, with no evidence of aortic valve stenosis. Aortic valve mean gradient measures 4.0 mmHg. Comparison(s): No prior Echocardiogram. FINDINGS  Left Ventricle: Left ventricular ejection fraction, by estimation, is 25 to 30%. The left  ventricle has severely decreased function. The left ventricle demonstrates regional wall motion abnormalities. The left ventricular internal cavity size was mildly  dilated. There is moderate left ventricular hypertrophy. Left ventricular diastolic parameters are consistent with Grade II diastolic dysfunction (pseudonormalization).  LV Wall Scoring: The mid and distal anterior wall, mid and distal lateral wall, mid and distal anterior septum, entire apex, mid and distal inferior wall, posterior wall, mid anterolateral segment, and mid inferoseptal segment are hypokinetic. The basal anteroseptal segment, basal anterolateral segment, basal anterior segment, basal inferior segment, and basal inferoseptal segment are normal. Right Ventricle: The right ventricular size is mildly enlarged. No increase in right ventricular wall thickness. Right ventricular systolic function is moderately reduced. There is mildly elevated pulmonary artery systolic pressure. The tricuspid regurgitant velocity is 2.92 m/s, and with an assumed right atrial pressure of 8 mmHg, the estimated right ventricular systolic pressure is 35.5 mmHg. Left Atrium: Left atrial size was mild to moderately dilated. Right Atrium: Right atrial size was mild to moderately dilated. Pericardium: Trivial pericardial effusion is present. The  pericardial effusion is posterior to the left ventricle. Mitral Valve: The mitral valve is grossly normal. Mild to moderate mitral annular calcification. Mild mitral valve regurgitation. MV peak gradient, 3.3 mmHg. The mean mitral valve gradient is 2.0 mmHg. Tricuspid Valve: The tricuspid valve is grossly normal. Tricuspid valve regurgitation is mild. Aortic Valve: The aortic valve is tricuspid. There is mild aortic valve annular calcification. Aortic valve regurgitation is not visualized. Mild aortic valve sclerosis is present, with no evidence of aortic valve stenosis. Aortic valve mean gradient measures 4.0 mmHg. Aortic valve  peak gradient measures 8.3 mmHg. Aortic valve area, by VTI measures 2.23 cm. Pulmonic Valve: The pulmonic valve was grossly normal. Pulmonic valve regurgitation is trivial. Aorta: The aortic root is normal in size and structure. IAS/Shunts: No atrial level shunt detected by color flow Doppler.  LEFT VENTRICLE PLAX 2D LVIDd:         5.60 cm LVIDs:         4.70 cm LV PW:         1.30 cm LV IVS:        1.40 cm LVOT diam:     2.00 cm LV SV:         56 LV SV Index:   27 LVOT Area:     3.14 cm  LV Volumes (MOD) LV vol d, MOD A2C: 102.0 ml LV vol d, MOD A4C: 133.0 ml LV vol s, MOD A2C: 58.3 ml LV vol s, MOD A4C: 90.2 ml LV SV MOD A2C:     43.7 ml LV SV MOD A4C:     133.0 ml LV SV MOD BP:      44.1 ml RIGHT VENTRICLE RV Basal diam:  4.40 cm RV Mid diam:    4.70 cm RV S prime:     20.40 cm/s TAPSE (M-mode): 2.2 cm LEFT ATRIUM              Index       RIGHT ATRIUM           Index LA diam:        4.70 cm  2.29 cm/m  RA Area:     24.40 cm LA Vol (A2C):   128.0 ml 62.34 ml/m RA Volume:   89.20 ml  43.44 ml/m LA Vol (A4C):   81.2 ml  39.55 ml/m LA Biplane Vol: 101.0 ml 49.19 ml/m  AORTIC VALVE                   PULMONIC VALVE AV Area (Vmax):    1.99 cm    PV Vmax:       0.66 m/s AV Area (Vmean):   1.80 cm    PV Peak grad:  1.8 mmHg AV Area (VTI):     2.23 cm AV Vmax:           144.00 cm/s AV Vmean:          94.033 cm/s AV VTI:            0.249 m AV Peak Grad:      8.3 mmHg AV Mean Grad:      4.0 mmHg LVOT Vmax:         91.37 cm/s LVOT Vmean:        53.900 cm/s LVOT VTI:          0.177 m LVOT/AV VTI ratio: 0.71  AORTA Ao Root diam: 3.30 cm Ao Asc diam:  3.50 cm MITRAL VALVE  TRICUSPID VALVE MV Area (PHT): 5.09 cm    TR Peak grad:   34.1 mmHg MV Area VTI:   1.98 cm    TR Vmax:        292.00 cm/s MV Peak grad:  3.3 mmHg MV Mean grad:  2.0 mmHg    SHUNTS MV Vmax:       0.90 m/s    Systemic VTI:  0.18 m MV Vmean:      58.8 cm/s   Systemic Diam: 2.00 cm MV Decel Time: 149 msec MV E velocity: 80.60 cm/s MV A  velocity: 72.40 cm/s MV E/A ratio:  1.11 Rozann Lesches MD Electronically signed by Rozann Lesches MD Signature Date/Time: 03/24/2021/11:31:13 AM    Final     Procedures Procedures   Medications Ordered in ED Medications  sodium chloride flush (NS) 0.9 % injection 3 mL (3 mLs Intravenous Patient Refused/Not Given 03/24/21 1002)  sodium chloride flush (NS) 0.9 % injection 3 mL (has no administration in time range)  0.9 %  sodium chloride infusion (has no administration in time range)  acetaminophen (TYLENOL) tablet 650 mg (650 mg Oral Given 03/24/21 1358)  ondansetron (ZOFRAN) injection 4 mg (4 mg Intravenous Given 03/24/21 1201)  aspirin EC tablet 81 mg (81 mg Oral Given 03/24/21 1000)  enoxaparin (LOVENOX) injection 100 mg (100 mg Subcutaneous Given 03/24/21 1001)  amiodarone (NEXTERONE PREMIX) 360-4.14 MG/200ML-% (1.8 mg/mL) IV infusion (60 mg/hr Intravenous Infusion Verify 03/24/21 1502)  amiodarone (NEXTERONE PREMIX) 360-4.14 MG/200ML-% (1.8 mg/mL) IV infusion (30 mg/hr Intravenous New Bag/Given 03/24/21 1621)  Chlorhexidine Gluconate Cloth 2 % PADS 6 each (6 each Topical Given 03/24/21 1150)  furosemide (LASIX) injection 20 mg (20 mg Intravenous Given 03/24/21 0843)    ED Course  I have reviewed the triage vital signs and the nursing notes.  Pertinent labs & imaging results that were available during my care of the patient were reviewed by me and considered in my medical decision making (see chart for details).  Clinical Course as of 03/24/21 1748  Wed Mar 24, 2021  0759 Chest x-ray interpreted by me as poor inspiratory effort no gross infiltrates.  Awaiting radiology reading. [MB]    Clinical Course User Index [MB] Hayden Rasmussen, MD   MDM Rules/Calculators/A&P                          This patient complains of shortness of breath; this involves an extensive number of treatment Options and is a complaint that carries with it a high risk of complications and Morbidity. The  differential includes CHF, COPD, pneumonia, pneumothorax, anemia metabolic derangement  I ordered, reviewed and interpreted labs, which included CBC with normal white count normal hemoglobin, chemistries fairly normal elevated glucose, troponins mildly elevated need to be trended, BNP markedly elevated no priors to compare with, COVID and flu negative, D-dimer negative, TSH is pending at time of signout I ordered medication IV Lasix IV heparin.  IV heparin changed to subcu Lovenox by hospitalist I ordered imaging studies which included chest x-ray and I independently    visualized and interpreted imaging which showed CHF Additional history obtained from patient's daughter Previous records obtained and reviewed in epic no recent admissions I consulted Dr. Sherral Hammers [facility] hospitalist and discussed lab and imaging findings  Critical Interventions: None  After the interventions stated above, I reevaluated the patient and found patient still to be symptomatic.  She will need to be admission to the hospital for  cardiac echo and further cardiac work-up.  Patient agreeable to plan.   Final Clinical Impression(s) / ED Diagnoses Final diagnoses:  Acute congestive heart failure, unspecified heart failure type [facility])  New onset atrial fibrillation [facility])    Rx / DC Orders ED Discharge Orders     None        Hayden Rasmussen, MD 03/24/21 1753

## 2021-03-24 NOTE — ED Notes (Signed)
Echo at bedside

## 2021-03-25 ENCOUNTER — Encounter (HOSPITAL_COMMUNITY)
Admission: EM | Disposition: A | Discharge status: Home / Self Care | Payer: Self-pay | Source: Home / Self Care | Attending: Cardiology

## 2021-03-25 ENCOUNTER — Encounter (HOSPITAL_COMMUNITY): Payer: Self-pay | Admitting: Internal Medicine

## 2021-03-25 DIAGNOSIS — I5021 Acute systolic (congestive) heart failure: Secondary | ICD-10-CM

## 2021-03-25 DIAGNOSIS — I10 Essential (primary) hypertension: Secondary | ICD-10-CM

## 2021-03-25 DIAGNOSIS — I5041 Acute combined systolic (congestive) and diastolic (congestive) heart failure: Secondary | ICD-10-CM

## 2021-03-25 DIAGNOSIS — E785 Hyperlipidemia, unspecified: Secondary | ICD-10-CM

## 2021-03-25 DIAGNOSIS — E1169 Type 2 diabetes mellitus with other specified complication: Secondary | ICD-10-CM | POA: Diagnosis not present

## 2021-03-25 HISTORY — PX: RIGHT/LEFT HEART CATH AND CORONARY ANGIOGRAPHY: CATH118266

## 2021-03-25 LAB — GLUCOSE, CAPILLARY
Glucose-Capillary: 271 mg/dL — ABNORMAL HIGH (ref 70–99)
Glucose-Capillary: 181 mg/dL — ABNORMAL HIGH (ref 70–99)
Glucose-Capillary: 147 mg/dL — ABNORMAL HIGH (ref 70–99)
Glucose-Capillary: 141 mg/dL — ABNORMAL HIGH (ref 70–99)
Glucose-Capillary: 142 mg/dL — ABNORMAL HIGH (ref 70–99)

## 2021-03-25 LAB — COMPREHENSIVE METABOLIC PANEL
ALT: 17 U/L (ref 0–44)
AST: 20 U/L (ref 15–41)
Albumin: 3.8 g/dL (ref 3.5–5.0)
Alkaline Phosphatase: 40 U/L (ref 38–126)
Anion gap: 7 (ref 5–15)
BUN: 13 mg/dL (ref 8–23)
CO2: 28 mmol/L (ref 22–32)
Calcium: 8.7 mg/dL — ABNORMAL LOW (ref 8.9–10.3)
Chloride: 105 mmol/L (ref 98–111)
Creatinine, Ser: 0.64 mg/dL (ref 0.44–1.00)
GFR, Estimated: >60 mL/min (ref >60)
Glucose, Bld: 147 mg/dL — ABNORMAL HIGH (ref 70–99)
Potassium: 3.6 mmol/L (ref 3.5–5.1)
Sodium: 140 mmol/L (ref 135–145)
Total Bilirubin: 1 mg/dL (ref 0.3–1.2)
Total Protein: 6.2 g/dL — ABNORMAL LOW (ref 6.5–8.1)

## 2021-03-25 LAB — CBC
HCT: 39.1 % (ref 36.0–46.0)
Hemoglobin: 13.3 g/dL (ref 12.0–15.0)
MCH: 30.6 pg (ref 26.0–34.0)
MCHC: 34 g/dL (ref 30.0–36.0)
MCV: 89.9 fL (ref 80.0–100.0)
Platelets: 145 10*3/uL — ABNORMAL LOW (ref 150–400)
RBC: 4.35 MIL/uL (ref 3.87–5.11)
RDW: 13.6 % (ref 11.5–15.5)
WBC: 7.6 10*3/uL (ref 4.0–10.5)
nRBC: 0 % (ref 0.0–0.2)

## 2021-03-25 LAB — LIPID PANEL
Cholesterol: 98 mg/dL (ref 0–200)
HDL: 32 mg/dL — ABNORMAL LOW (ref >40)
LDL Cholesterol: 31 mg/dL (ref 0–99)
Total CHOL/HDL Ratio: 3.1 RATIO
Triglycerides: 175 mg/dL — ABNORMAL HIGH (ref <150)
VLDL: 35 mg/dL (ref 0–40)

## 2021-03-25 LAB — MAGNESIUM: Magnesium: 1.8 mg/dL (ref 1.7–2.4)

## 2021-03-25 LAB — TROPONIN I (HIGH SENSITIVITY): Troponin I (High Sensitivity): 37 ng/L — ABNORMAL HIGH (ref <18)

## 2021-03-25 SURGERY — RIGHT/LEFT HEART CATH AND CORONARY ANGIOGRAPHY
Anesthesia: LOCAL

## 2021-03-25 MED ORDER — HEPARIN (PORCINE) IN NACL 1000-0.9 UT/500ML-% IV SOLN
INTRAVENOUS | Status: DC | PRN
  Administered 2021-03-25 (×2): 500 mL

## 2021-03-25 MED ORDER — INSULIN ASPART 100 UNIT/ML IJ SOLN
6.0000 [IU] | Freq: Once | INTRAMUSCULAR | Status: AC
  Administered 2021-03-25: 6 [IU] via SUBCUTANEOUS

## 2021-03-25 MED ORDER — SODIUM CHLORIDE 0.9% FLUSH: 3.0000 mL | INTRAVENOUS | Status: DC | PRN

## 2021-03-25 MED ORDER — SODIUM CHLORIDE 0.9% FLUSH
3.0000 mL | Freq: Two times a day (BID) | INTRAVENOUS | Status: DC
Start: 2021-03-25 — End: 2021-03-27
  Administered 2021-03-26 (×2): 3 mL via INTRAVENOUS

## 2021-03-25 MED ORDER — HEPARIN SODIUM (PORCINE) 1000 UNIT/ML IJ SOLN
INTRAMUSCULAR | Status: DC | PRN
  Administered 2021-03-25: 5000 [IU] via INTRAVENOUS

## 2021-03-25 MED ORDER — ASPIRIN 81 MG PO CHEW
81.0000 mg | CHEWABLE_TABLET | Freq: Every day | ORAL | Status: DC
  Administered 2021-03-26 – 2021-03-27 (×2): 81 mg via ORAL
  Filled 2021-03-25 (×2): qty 1

## 2021-03-25 MED ORDER — IOHEXOL 350 MG/ML SOLN
INTRAVENOUS | Status: DC | PRN
  Administered 2021-03-25: 45 mL

## 2021-03-25 MED ORDER — ASPIRIN EC 81 MG PO TBEC: 81.0000 mg | DELAYED_RELEASE_TABLET | Freq: Every day | ORAL | Status: DC

## 2021-03-25 MED ORDER — LOSARTAN POTASSIUM 50 MG PO TABS
100.0000 mg | ORAL_TABLET | Freq: Every day | ORAL | Status: DC
  Administered 2021-03-26: 100 mg via ORAL
  Filled 2021-03-25 (×2): qty 2

## 2021-03-25 MED ORDER — ASPIRIN EC 81 MG PO TBEC
81.0000 mg | DELAYED_RELEASE_TABLET | Freq: Every day | ORAL | Status: DC
  Administered 2021-03-25: 81 mg via ORAL
  Filled 2021-03-25: qty 1

## 2021-03-25 MED ORDER — SODIUM CHLORIDE 0.9 % IV SOLN: INTRAVENOUS | Status: DC

## 2021-03-25 MED ORDER — CHLORTHALIDONE 25 MG PO TABS: 25.0000 mg | ORAL_TABLET | Freq: Every day | ORAL | Status: DC

## 2021-03-25 MED ORDER — MAGNESIUM SULFATE 2 GM/50ML IV SOLN
2.0000 g | Freq: Once | INTRAVENOUS | Status: AC
  Administered 2021-03-25: 2 g via INTRAVENOUS
  Filled 2021-03-25: qty 50

## 2021-03-25 MED ORDER — LIRAGLUTIDE 18 MG/3ML ~~LOC~~ SOPN: 1.2000 mg | PEN_INJECTOR | Freq: Every day | SUBCUTANEOUS | Status: DC

## 2021-03-25 MED ORDER — NITROGLYCERIN 1 MG/10 ML FOR IR/CATH LAB
INTRA_ARTERIAL | Status: AC
  Filled 2021-03-25: qty 10

## 2021-03-25 MED ORDER — VERAPAMIL HCL 2.5 MG/ML IV SOLN: INTRA_ARTERIAL | Status: DC | PRN

## 2021-03-25 MED ORDER — MELATONIN 3 MG PO TABS
3.0000 mg | ORAL_TABLET | Freq: Every evening | ORAL | Status: DC | PRN
  Administered 2021-03-25: 3 mg via ORAL
  Filled 2021-03-25: qty 1

## 2021-03-25 MED ORDER — VERAPAMIL HCL 2.5 MG/ML IV SOLN
INTRAVENOUS | Status: AC
  Filled 2021-03-25: qty 2

## 2021-03-25 MED ORDER — CARVEDILOL 3.125 MG PO TABS
3.1250 mg | ORAL_TABLET | Freq: Two times a day (BID) | ORAL | Status: DC
  Administered 2021-03-25 – 2021-03-27 (×5): 3.125 mg via ORAL
  Filled 2021-03-25 (×5): qty 1

## 2021-03-25 MED ORDER — HEPARIN (PORCINE) IN NACL 1000-0.9 UT/500ML-% IV SOLN
INTRAVENOUS | Status: AC
  Filled 2021-03-25: qty 500

## 2021-03-25 MED ORDER — SODIUM CHLORIDE 0.9 % IV SOLN: INTRAVENOUS | Status: AC

## 2021-03-25 MED ORDER — DORZOLAMIDE HCL 2 % OP SOLN
1.0000 [drp] | Freq: Two times a day (BID) | OPHTHALMIC | Status: DC
  Administered 2021-03-25 – 2021-03-27 (×5): 1 [drp] via OPHTHALMIC
  Filled 2021-03-25: qty 10

## 2021-03-25 MED ORDER — TIMOLOL MALEATE 0.5 % OP SOLN
1.0000 [drp] | Freq: Two times a day (BID) | OPHTHALMIC | Status: DC
  Administered 2021-03-25 – 2021-03-27 (×6): 1 [drp] via OPHTHALMIC
  Filled 2021-03-25: qty 5

## 2021-03-25 MED ORDER — POTASSIUM CHLORIDE CRYS ER 20 MEQ PO TBCR
40.0000 meq | EXTENDED_RELEASE_TABLET | Freq: Once | ORAL | Status: AC
  Administered 2021-03-25: 40 meq via ORAL
  Filled 2021-03-25: qty 2

## 2021-03-25 MED ORDER — BRIMONIDINE TARTRATE-TIMOLOL 0.2-0.5 % OP SOLN
1.0000 [drp] | Freq: Two times a day (BID) | OPHTHALMIC | Status: DC
  Filled 2021-03-25: qty 5

## 2021-03-25 MED ORDER — BRIMONIDINE TARTRATE 0.2 % OP SOLN
1.0000 [drp] | Freq: Three times a day (TID) | OPHTHALMIC | Status: DC
  Administered 2021-03-25 – 2021-03-27 (×7): 1 [drp] via OPHTHALMIC
  Filled 2021-03-25: qty 5

## 2021-03-25 MED ORDER — HEPARIN SODIUM (PORCINE) 1000 UNIT/ML IJ SOLN
INTRAMUSCULAR | Status: AC
  Filled 2021-03-25: qty 1

## 2021-03-25 MED ORDER — ACETAMINOPHEN 325 MG PO TABS: 650.0000 mg | ORAL_TABLET | ORAL | Status: DC | PRN

## 2021-03-25 MED ORDER — INSULIN ASPART 100 UNIT/ML IJ SOLN: 0.0000 [IU] | Freq: Every day | INTRAMUSCULAR | Status: DC

## 2021-03-25 MED ORDER — ONDANSETRON HCL 4 MG/2ML IJ SOLN: 4.0000 mg | Freq: Four times a day (QID) | INTRAMUSCULAR | Status: DC | PRN

## 2021-03-25 MED ORDER — GLIMEPIRIDE 1 MG PO TABS
1.0000 mg | ORAL_TABLET | Freq: Every day | ORAL | Status: DC
  Administered 2021-03-27: 1 mg via ORAL
  Filled 2021-03-25 (×2): qty 1

## 2021-03-25 MED ORDER — HYDRALAZINE HCL 20 MG/ML IJ SOLN: 10.0000 mg | INTRAMUSCULAR | Status: AC | PRN

## 2021-03-25 MED ORDER — LIDOCAINE HCL (PF) 1 % IJ SOLN
INTRAMUSCULAR | Status: DC | PRN
  Administered 2021-03-25 (×2): 4 mL

## 2021-03-25 MED ORDER — GABAPENTIN 300 MG PO CAPS
300.0000 mg | ORAL_CAPSULE | Freq: Every day | ORAL | Status: DC
  Filled 2021-03-25: qty 1

## 2021-03-25 MED ORDER — GABAPENTIN 300 MG PO CAPS
300.0000 mg | ORAL_CAPSULE | Freq: Every day | ORAL | Status: DC
  Administered 2021-03-26: 300 mg via ORAL
  Filled 2021-03-25: qty 1

## 2021-03-25 MED ORDER — LATANOPROST 0.005 % OP SOLN
1.0000 [drp] | Freq: Two times a day (BID) | OPHTHALMIC | Status: DC
  Administered 2021-03-25 – 2021-03-27 (×5): 1 [drp] via OPHTHALMIC
  Filled 2021-03-25: qty 2.5

## 2021-03-25 MED ORDER — LIDOCAINE HCL (PF) 1 % IJ SOLN
INTRAMUSCULAR | Status: AC
  Filled 2021-03-25: qty 30

## 2021-03-25 MED ORDER — SODIUM CHLORIDE 0.9 % IV SOLN: 250.0000 mL | INTRAVENOUS | Status: DC | PRN

## 2021-03-25 MED ORDER — LABETALOL HCL 5 MG/ML IV SOLN: 10.0000 mg | INTRAVENOUS | Status: AC | PRN

## 2021-03-25 MED ORDER — SODIUM CHLORIDE 0.9% FLUSH
3.0000 mL | Freq: Two times a day (BID) | INTRAVENOUS | Status: DC
  Administered 2021-03-25 – 2021-03-27 (×4): 3 mL via INTRAVENOUS

## 2021-03-25 MED ORDER — ASPIRIN 81 MG PO CHEW
81.0000 mg | CHEWABLE_TABLET | ORAL | Status: AC
Start: 2021-03-25 — End: 2021-03-25

## 2021-03-25 MED ORDER — ATORVASTATIN CALCIUM 10 MG PO TABS
10.0000 mg | ORAL_TABLET | Freq: Every day | ORAL | Status: DC
  Administered 2021-03-25 – 2021-03-26 (×2): 10 mg via ORAL
  Filled 2021-03-25 (×2): qty 1

## 2021-03-25 MED ORDER — INSULIN ASPART 100 UNIT/ML IJ SOLN
0.0000 [IU] | Freq: Three times a day (TID) | INTRAMUSCULAR | Status: DC
  Administered 2021-03-25: 2 [IU] via SUBCUTANEOUS
  Administered 2021-03-25: 1 [IU] via SUBCUTANEOUS
  Administered 2021-03-26 (×2): 2 [IU] via SUBCUTANEOUS
  Administered 2021-03-27: 3 [IU] via SUBCUTANEOUS

## 2021-03-25 SURGICAL SUPPLY — 13 items
CATH BALLN WEDGE 5F 110CM (CATHETERS) ×1 IMPLANT
CATH INFINITI JR4 5F (CATHETERS) ×1 IMPLANT
CATH OPTITORQUE TIG 4.0 5F (CATHETERS) ×1 IMPLANT
DEVICE RAD COMP TR BAND LRG (VASCULAR PRODUCTS) ×1 IMPLANT
GLIDESHEATH SLEND A-KIT 6F 22G (SHEATH) ×1 IMPLANT
GUIDEWIRE INQWIRE 1.5J.035X260 (WIRE) IMPLANT
INQWIRE 1.5J .035X260CM (WIRE) ×2
KIT HEART LEFT (KITS) ×2 IMPLANT
PACK CARDIAC CATHETERIZATION (CUSTOM PROCEDURE TRAY) ×2 IMPLANT
SHEATH GLIDE SLENDER 4/5FR (SHEATH) ×1 IMPLANT
TRANSDUCER W/STOPCOCK (MISCELLANEOUS) ×2 IMPLANT
TUBING CIL FLEX 10 FLL-RA (TUBING) ×2 IMPLANT
WIRE HI TORQ VERSACORE-J 145CM (WIRE) ×1 IMPLANT

## 2021-03-25 NOTE — Consult Note (Addendum)
Cardiology Consultation:   Patient ID: SHAREKA CASALE MRN: [id number]; DOB: Nov 30, 1950  Admit date: 03/24/2021 Date of Consult: 03/25/2021  PCP:  Asencion Noble, MD   [place] Prov[facility] Cardiologist: New to [facility] - Dr. Domenic Polite  Patient Profile:   [name] is a 70 y.o. female with a past medical history of pulmonary sarcoidosis (not currently requiring treatment), HTN, HLD, Type 2 DM and Glaucoma who is being seen today for the evaluation of atrial fibrillation and new cardiomyopathy at the request of Dr. Sherral Hammers.  History of Present Illness:   [name] presented to [facility] ED on 03/24/2021 for evaluation of worsening dyspnea which had started earlier that morning. She reports she might have noticed some worsening dyspnea over the past few weeks but no definitive changes. No recent chest pain or palpitations. Yesterday morning prior to starting her Bible study, she developed acute worsening dyspnea. No associated pain or palpitations at that time. No recent orthopnea or PND. She has noticed intermittent lower extremity edema along her ankles but thought this was secondary to her Amlodipine.   Initial labs showed WBC 7.5, Hgb 14.0, platelets 138, Na+ 139, K+ 3.7 and creatinine 0.73. BNP 1119. D-dimer negative. Mg 1.8. TSH 4.613. COVID negative. Initial Hs Troponin 34 with repeat values of 51, 46 and 37. FLP shows total cholesterol of 98 with triglycerides 175 and LDL 31.  CXR showing cardiomegaly with increased interstitial markings suggestive of mild pulmonary edema. EKG read as showing atrial fibrillation, HR 110 and ventricular bigeminy with IVCD but it appears she does have intermittent p-waves. She continued to have episodes of intermittent atrial fibrillation while in the ED and was started on IV Amiodarone.  Echocardiogram yesterday showed a reduced EF of 25 to 30% with wall motion abnormalities noted including the mid and distal anterior wall, mid and distal lateral wall, mid and  distal anterior septum, entire apex, mid and distal inferior wall, mid anterior lateral segment and mid inferior septal segment being hypokinetic. She did have moderate LVH and grade 2 diastolic dysfunction. RV function was moderately reduced and PASP elevated to 42.1 mmHg. She did have moderate biatrial dilation along with a trivial pericardial effusion and mild MR.  She did receive IV Lasix 20mg  x1 yesterday and full I&O's were not recorded but she reports significant urinary output. Has already noticed significant improvement in her dyspnea since admission.    Past Medical History:  Diagnosis Date   Atypical nevi 04/23/2535   Complication of anesthesia    Diabetes mellitus without complication (HCC)    Glaucoma    Hyperlipidemia    Hypertension    PONV (postoperative nausea and vomiting)    Proximal humerus fracture    right   Sarcoidosis    Vaginal dryness 04/01/2014   Varicose veins     Past Surgical History:  Procedure Laterality Date   APPENDECTOMY     BIOPSY  09/27/2017   Procedure: BIOPSY;  Surgeon: Daneil Dolin, MD;  Location: [facility];  Service: Endoscopy;;  ileocecal valve   CESAREAN SECTION     X 3   COLONOSCOPY     COLONOSCOPY  07/23/2012   Procedure: COLONOSCOPY;  Surgeon: Daneil Dolin, MD;  Location: [facility];  Service: Endoscopy;  Laterality: N/A;  7:30 AM   COLONOSCOPY N/A 09/27/2017   Procedure: COLONOSCOPY;  Surgeon: Daneil Dolin, MD;  Location: [facility];  Service: Endoscopy;  Laterality: N/A;  8:30   TONSILLECTOMY  TUBAL LIGATION       Home Medications:  Prior to Admission medications   Medication Sig Start Date End Date Taking? Authorizing Provider  aspirin EC 81 MG tablet Take 81 mg by mouth at bedtime.   Yes [provider]  atorvastatin (LIPITOR) 10 MG tablet Take 10 mg by mouth at bedtime.    Yes [provider]  brimonidine-timolol (COMBIGAN) 0.2-0.5 % ophthalmic solution INSTILL ONE DROP IN BOTH  EYES TWO  TIMES DAILY 10/28/16  Yes [provider]  Clobetasol Prop Emollient Base 0.05 % emollient cream Use bid to affected areas for 2 weeks then 3 x a week 11/20/19  Yes Derrek Monaco A, NP  dorzolamide (TRUSOPT) 2 % ophthalmic solution Place 1 drop into both eyes 2 (two) times daily. 07/08/17  Yes [provider]  gabapentin (NEURONTIN) 300 MG capsule Take 300 mg by mouth daily. 02/18/21  Yes [provider]  glimepiride (AMARYL) 1 MG tablet Take 1 mg by mouth daily before breakfast.    Yes [provider]  latanoprost (XALATAN) 0.005 % ophthalmic solution Place 1 drop into both eyes 2 (two) times daily.   Yes [provider]  liraglutide (VICTOZA) 18 MG/3ML SOPN Inject 1.2 mg into the skin at bedtime.    Yes [provider]  losartan (COZAAR) 100 MG tablet Take 100 mg by mouth daily. 07/15/19  Yes [provider]  metFORMIN (GLUCOPHAGE) 1000 MG tablet Take 1,000 mg by mouth 2 (two) times daily.   Yes [provider]  Multiple Vitamin (MULTIVITAMIN WITH MINERALS) TABS tablet Take 1 tablet by mouth at bedtime.   Yes [provider]  amLODipine (NORVASC) 2.5 MG tablet Take 2.5 mg by mouth daily. Patient not taking: Reported on 03/24/2021 07/29/19   [provider]  chlorthalidone (HYGROTON) 25 MG tablet Take 25 mg by mouth daily. Patient not taking: Reported on 03/24/2021 01/29/21   [provider]    Inpatient Medications: Scheduled Meds:  aspirin EC  81 mg Oral Daily   atorvastatin  10 mg Oral QHS   brimonidine  1 drop Both Eyes TID   Chlorhexidine Gluconate Cloth  6 each Topical Daily   dorzolamide  1 drop Both Eyes BID   enoxaparin (LOVENOX) injection  100 mg Subcutaneous Q12H   gabapentin  300 mg Oral Daily   insulin aspart  0-5 Units Subcutaneous QHS   insulin aspart  0-9 Units Subcutaneous TID WC   latanoprost  1 drop Both Eyes BID   sodium chloride flush  3 mL Intravenous Q12H   sodium chloride  flush  3 mL Intravenous Q12H   timolol  1 drop Both Eyes BID   Continuous Infusions:  sodium chloride     amiodarone 30 mg/hr (03/24/21 1824)   magnesium sulfate bolus IVPB 2 g (03/25/21 0857)   PRN Meds: sodium chloride, acetaminophen, ondansetron (ZOFRAN) IV, sodium chloride flush  Allergies:    Allergies  Allergen Reactions   Macrobid [Nitrofurantoin Macrocrystal] Nausea And Vomiting   Codeine Nausea And Vomiting   Sulfa Antibiotics Other (See Comments)    Pt cannot remember   Ciprofloxacin     Decreased BP    Social History:   Social History   Socioeconomic History   Marital status: Widowed    Spouse name: Not on file   Number of children: Not on file   Years of education: Not on file   Highest education level: Not on file  Occupational History   Not on  file  Tobacco Use   Smoking status: Never   Smokeless tobacco: Never  Vaping Use   Vaping Use: Never used  Substance and Sexual Activity   Alcohol use: No   Drug use: No   Sexual activity: Not Currently    Birth control/protection: Post-menopausal, Surgical    Comment: tubal  Other Topics Concern   Not on file  Social History Narrative   Not on file   Social Determinants of Health   Financial Resource Strain: Low Risk    Difficulty of Paying Living Expenses: Not hard at all  Food Insecurity: No Food Insecurity   Worried About Charity fundraiser in the Last Year: Never true   [place] in the Last Year: Never true  Transportation Needs: No Transportation Needs   Lack of Transportation (Medical): No   Lack of Transportation (Non-Medical): No  Physical Activity: Insufficiently Active   Days of Exercise per Week: 2 days   Minutes of Exercise per Session: 30 min  Stress: No Stress Concern Present   Feeling of Stress : Not at all  Social Connections: Moderately Integrated   Frequency of Communication with Friends and Family: More than three times a week   Frequency of Social Gatherings with  Friends and Family: More than three times a week   Attends Religious Services: 1 to 4 times per year   Active Member of [employer] or Organizations: Yes   Attends Archivist Meetings: More than 4 times per year   Marital Status: Widowed  Human resources officer Violence: Not At Risk   Fear of Current or Ex-Partner: No   Emotionally Abused: No   Physically Abused: No   Sexually Abused: No    Family History:    Family History  Problem Relation Age of Onset   Varicose Veins Mother    Deep vein thrombosis Mother    Cancer Father    Varicose Veins Father    Deep vein thrombosis Father    Diabetes Father    Heart disease Father    Hypertension Father    Diabetes Sister    Heart disease Sister    Hyperlipidemia Sister    Hypertension Sister    Hypertension Son    Colon cancer Brother    Cancer Brother        stomach   Varicose Veins Daughter    Hypertension Son    Miscarriages / [place] Daughter      ROS:  Please see the history of present illness.   All other ROS reviewed and negative.     Physical Exam/Data:   Vitals:   03/25/21 0721 03/25/21 0730 03/25/21 0800 03/25/21 0900  BP:    135/74  Pulse: 81 91 83 85  Resp: 17 17 19  (!) 22  Temp: 98 F (36.7 C)     TempSrc: Oral     SpO2: 97% 97% 97% 97%  Weight:      Height:        Intake/Output Summary (Last 24 hours) at 03/25/2021 0933 Last data filed at 03/24/2021 1502 Gross per 24 hour  Intake 123.72 ml  Output 200 ml  Net -76.28 ml   Last 3 Weights 03/25/2021 03/24/2021 03/24/2021  Weight (lbs) 211 lb 3.2 oz 210 lb 5.1 oz 210 lb  Weight (kg) 95.8 kg 95.4 kg 95.255 kg     Body mass index is 34.09 kg/m.  General:  Well nourished, well developed female appearing in no acute distress. HEENT: normal Neck:  JVD at 10 cm.  Vascular: No carotid bruits; Distal pulses 2+ bilaterally Cardiac:  normal S1, S2; RRR with occasional ectopic beats; no murmur.  Lungs:  clear to auscultation bilaterally, no wheezing,  rhonchi or rales  Abd: soft, nontender, no hepatomegaly  Ext: trace ankle edema bilaterally. Musculoskeletal:  No deformities, BUE and BLE strength normal and equal Skin: warm and dry  Neuro:  CNs 2-12 intact, no focal abnormalities noted Psych:  Normal affect   Telemetry:  Telemetry was personally reviewed and demonstrates:  NSR overnight with HR in the 70's to 90's. Occasional PVC's and episodes of NSVT with the longest being 14 beats.   Relevant CV Studies:  Echocardiogram: 03/24/2021 IMPRESSIONS     1. Left ventricular ejection fraction, by estimation, is 25 to 30%. The  left ventricle has severely decreased function. The left ventricle  demonstrates regional wall motion abnormalities (see scoring  diagram/findings for description - diffuse  hypokinesis with relative preservation of basal contraction, although not  clearly stress-induced cardiomyopathy pattern). The left ventricular  internal cavity size was mildly dilated. There is moderate left  ventricular hypertrophy. Left ventricular  diastolic parameters are consistent with Grade II diastolic dysfunction  (pseudonormalization).   2. Right ventricular systolic function is moderately reduced. The right  ventricular size is mildly enlarged. There is mildly elevated pulmonary  artery systolic pressure. The estimated right ventricular systolic  pressure is 16.1 mmHg.   3. Left atrial size was mild to moderately dilated.   4. Right atrial size was mild to moderately dilated.   5. There is a trivial pericardial effusion posterior to the left  ventricle.   6. The mitral valve is grossly normal. Mild mitral valve regurgitation.  The mean mitral valve gradient is 2.0 mmHg.   7. The aortic valve is tricuspid. Aortic valve regurgitation is not  visualized. Mild aortic valve sclerosis is present, with no evidence of  aortic valve stenosis. Aortic valve mean gradient measures 4.0 mmHg.   Laboratory Data:  High Sensitivity  Troponin:   Recent Labs  Lab 03/24/21 0722 03/24/21 0917 03/24/21 1833 03/24/21 2138 03/25/21 0057  TROPONINIHS 14 34* 51* 46* 37*     Chemistry Recent Labs  Lab 03/24/21 0722 03/25/21 0440  NA 139 140  K 3.7 3.6  CL 106 105  CO2 24 28  GLUCOSE 247* 147*  BUN 18 13  CREATININE 0.73 0.64  CALCIUM 9.4 8.7*  MG 1.8 1.8  GFRNONAA >60 >60  ANIONGAP 9 7    Recent Labs  Lab 03/24/21 0722 03/25/21 0440  PROT 6.9 6.2*  ALBUMIN 4.2 3.8  AST 25 20  ALT 21 17  ALKPHOS 45 40  BILITOT 1.0 1.0   Lipids  Recent Labs  Lab 03/25/21 0440  CHOL 98  TRIG 175*  HDL 32*  LDLCALC 31  CHOLHDL 3.1    Hematology Recent Labs  Lab 03/24/21 0722 03/25/21 0440  WBC 7.5 7.6  RBC 4.47 4.35  HGB 14.0 13.3  HCT 39.6 39.1  MCV 88.6 89.9  MCH 31.3 30.6  MCHC 35.4 34.0  RDW 13.3 13.6  PLT 138* 145*   Thyroid  Recent Labs  Lab 03/24/21 0727  TSH 4.613*    BNP Recent Labs  Lab 03/24/21 0722  BNP 1,119.0*    DDimer  Recent Labs  Lab 03/24/21 0722  DDIMER 0.38     Radiology/Studies:  DG Chest Port 1 View  Result Date: 03/24/2021 CLINICAL DATA:  sob EXAM: PORTABLE CHEST 1 VIEW COMPARISON:  None. FINDINGS: The heart appears enlarged, which may be exaggerated due to projection. No pleural effusion. No pneumothorax. Increased interstitial markings. No lobar consolidation. No acute osseous abnormality. IMPRESSION: The heart appears enlarged with increased interstitial markings, suggestive of mild pulmonary edema. Electronically Signed   By: Albin Felling M.D.   On: 03/24/2021 08:23   Assessment and Plan:   1. Acute HFrEF/New Cardiomyopathy - Presented with acute dyspnea starting the day of admission. BNP at 1119 on admission and Hs Troponin values have been flat. Echo shows a newly reduced EF of 25-30% as outlined above with WMA.  - Given her new cardiomyopathy, will plan for [facility] today. She did have a few bites of food this AM so cath will be this afternoon. The  patient understands that risks include but are not limited to stroke (1 in 1000), death (1 in 45), kidney failure [usually temporary] (1 in 500), bleeding (1 in 200), allergic reaction [possibly serious] (1 in 200). If cath is consistent with NICM, she would likely benefit from a cMRI given her history of pulmonary sarcoidosis.  - She received IV Lasix yesterday and has noticed improvement in her dyspnea. Will hold additional IV Lasix for now in anticipation of her cath but she will likely require additional diuresis afterwards.  - Start Coreg 3.125mg  BID. She was on Losartan PTA but would plan to switch to [facility] following her cath and likely add an SGLT2-inhibitor and Spironolactone prior to discharge.   2. Questionable Atrial Fibrillation - By review of telemetry, it appears she was likely in an atrial tachycardia while in the ED as intermittent p-waves are noted. She was started on IV Amiodarone and her ectopy has improved. Will plan to start Coreg 3.125mg  BID for this and her cardiomyopathy. TSH elevated to 4.613. Will check Free T4.  - Her CHA2DS2-VASc Score is at least 5. Will review with Dr. Domenic Polite but strips thus far appears most consistent with an atrial tachycardia. She remains on full-dose Lovenox currently.   3. HTN - Her BP has been stable, at 135/74 on most recent check. She was on Losartan, Amlodipine and Chlorthalidone prior to admission. All currently held. Planning to start Coreg as outlined above and would anticipate additional medication changes following her cath.   4. HLD - FLP this admission shows total cholesterol of 98 with triglycerides 175 and LDL 31. Continue PTA Atorvastatin 10mg  daily.   5. NSVT - She did have 14 beats NSVT overnight. K+ at 3.6 this AM and Mg at 1.8. She already received Mg and K+ supplementation around 0900. Keep K+ ~ 4.0 and Mg ~ 2.0.  6. Type 2 DM - Hgb A1c pending. Holding PTA medications. SSI ordered.   Risk Assessment/Risk Scores:     New York Heart Association (NYHA) Functional Class NYHA Class III  CHA2DS2-VASc Score = 5   This indicates a 7.2% annual risk of stroke. The patient's score is based upon: CHF History: 1 HTN History: 1 Diabetes History: 1 Stroke History: 0 Vascular Disease History: 0 Age Score: 1 Gender Score: 1   For questions or updates, please contact [place] Please consult [email or web address] for contact info under    Signed, Erma Heritage, PA-C  03/25/2021 9:33 AM   Attending note:  Patient seen and examined.  I reviewed her records and discussed case with Ms. Ahmed Prima PA-C, I agree with her above findings.  [name] presents to the hospital with fairly sudden onset shortness of breath that occurred yesterday morning at  rest.  No associated chest discomfort or palpitations at the time.  In retrospect she has felt somewhat more short of breath with routine activity in the last few weeks, no orthopnea or PND, no progressive leg swelling.  Chest x-ray showed cardiomegaly with pulmonary edema and there was initial concern that she could have been atrial fibrillation although review of her telemetry and ECG shows intermittent sinus beats with a substantial degree of atrial ectopy as well as some PVCs, no clear sustained atrial fibrillation.  High-sensitivity troponin I levels have been minimally elevated in the 30-50 range not suggestive of ACS.  BNP 1119 and COVID testing negative.  She reports a remote history of pulmonary sarcoidosis without obvious routine follow-up.  Otherwise hypertension and type 2 diabetes mellitus at baseline.  With diuresis she feels much better today.  She was started on amiodarone in the ER.  Rhythm today is clearly sinus with burst of NSVT overnight.  She is sitting in bedside chair.  Afebrile.  Heart rate in the 70s to 80s in sinus rhythm.  Systolic blood pressure 286-381.  Lungs are clear.  Cardiac exam with RRR and no gallop.  No peripheral edema.  Pertinent lab  work includes potassium 3.6, BUN 13, creatinine 0.64, AST 20, ALT 17, peak high-sensitivity troponin I 51, LDL 31, lactic acid 1.8, hemoglobin 13.3, platelets 145, TSH 4.61.  Echocardiogram demonstrates LVEF 25 to 30% range with diffuse hypokinesis and relative preservation of basal contraction although not clearly a stress-induced cardiomyopathy pattern.  Also moderate LVH and moderate diastolic dysfunction, moderately reduced RV contraction with RVSP 42 mmHg, mild to moderate biatrial enlargement, trivial pericardial effusion.  Patient presents with acute systolic heart failure, biventricular dysfunction by echocardiogram.  She has a remote history of pulmonary sarcoidosis without obvious flareup or follow-up therapies, also hypertension and type 2 diabetes mellitus at baseline.  Cardiac enzymes are not diagnostic of ACS.  Not clear that she has had any persistent atrial fibrillation so far.  Situation discussed with patient and daughter with recommendation to transfer for right and left heart catheterization at [facility] later today.  If she does not have obstructive CAD, anticipate further work-up with cardiac MRI and also input from the advanced heart failure team to assist with further medication adjustments.  For now continue aspirin and Lipitor, starting low-dose Coreg, will stop IV amiodarone.  Satira Sark, M.D., F.A.C.C.

## 2021-03-25 NOTE — Plan of Care (Signed)

## 2021-03-25 NOTE — Interval H&P Note (Signed)
Cath Lab Visit (complete for each Cath Lab visit)  Clinical Evaluation Leading to the Procedure:   ACS: No.  Non-ACS:    Anginal Classification: No Symptoms  Anti-ischemic medical therapy: Minimal Therapy (1 class of medications)  Non-Invasive Test Results: No non-invasive testing performed  Prior CABG: No previous CABG      History and Physical Interval Note:  03/25/2021 2:50 PM  [name]  has presented today for surgery, with the diagnosis of cardiomyopathy.  The various methods of treatment have been discussed with the patient and family. After consideration of risks, benefits and other options for treatment, the patient has consented to  Procedure(s): RIGHT/LEFT HEART CATH AND CORONARY ANGIOGRAPHY (N/A) as a surgical intervention.  The patient's history has been reviewed, patient examined, no change in status, stable for surgery.  I have reviewed the patient's chart and labs.  Questions were answered to the patient's satisfaction.     Quay Burow

## 2021-03-25 NOTE — Progress Notes (Signed)
I was advised by cardiology service  that the patient is being transferred to [facility] for further cardiovascular work-up  -As per cardiology service patient will stay on cardiology service  -Hospitalist service signed off on 03/25/2021 - -Patient was Not evaluated by hospitalist service on 03/25/2021  -- This is a no charge note  Roxan Hockey, MD

## 2021-03-25 NOTE — H&P (View-Only) (Signed)
Cardiology Consultation:   Patient ID: [name] MRN: [id number]; DOB: 01-21-1951  Admit date: 03/24/2021 Date of Consult: 03/25/2021  PCP:  Asencion Noble, MD   [place] Providers Cardiologist: New to [facility] - Dr. Domenic Polite  Patient Profile:   [name] is a 70 y.o. female with a past medical history of pulmonary sarcoidosis (not currently requiring treatment), HTN, HLD, Type 2 DM and Glaucoma who is being seen today for the evaluation of atrial fibrillation and new cardiomyopathy at the request of Dr. Sherral Hammers.  History of Present Illness:   [name] presented to [facility] ED on 03/24/2021 for evaluation of worsening dyspnea which had started earlier that morning. She reports she might have noticed some worsening dyspnea over the past few weeks but no definitive changes. No recent chest pain or palpitations. Yesterday morning prior to starting her Bible study, she developed acute worsening dyspnea. No associated pain or palpitations at that time. No recent orthopnea or PND. She has noticed intermittent lower extremity edema along her ankles but thought this was secondary to her Amlodipine.   Initial labs showed WBC 7.5, Hgb 14.0, platelets 138, Na+ 139, K+ 3.7 and creatinine 0.73. BNP 1119. D-dimer negative. Mg 1.8. TSH 4.613. COVID negative. Initial Hs Troponin 34 with repeat values of 51, 46 and 37. FLP shows total cholesterol of 98 with triglycerides 175 and LDL 31.  CXR showing cardiomegaly with increased interstitial markings suggestive of mild pulmonary edema. EKG read as showing atrial fibrillation, HR 110 and ventricular bigeminy with IVCD but it appears she does have intermittent p-waves. She continued to have episodes of intermittent atrial fibrillation while in the ED and was started on IV Amiodarone.  Echocardiogram yesterday showed a reduced EF of 25 to 30% with wall motion abnormalities noted including the mid and distal anterior wall, mid and distal lateral wall, mid and  distal anterior septum, entire apex, mid and distal inferior wall, mid anterior lateral segment and mid inferior septal segment being hypokinetic. She did have moderate LVH and grade 2 diastolic dysfunction. RV function was moderately reduced and PASP elevated to 42.1 mmHg. She did have moderate biatrial dilation along with a trivial pericardial effusion and mild MR.  She did receive IV Lasix 20mg  x1 yesterday and full I&O's were not recorded but she reports significant urinary output. Has already noticed significant improvement in her dyspnea since admission.    Past Medical History:  Diagnosis Date   Atypical nevi 69/62/9528   Complication of anesthesia    Diabetes mellitus without complication (HCC)    Glaucoma    Hyperlipidemia    Hypertension    PONV (postoperative nausea and vomiting)    Proximal humerus fracture    right   Sarcoidosis    Vaginal dryness 04/01/2014   Varicose veins     Past Surgical History:  Procedure Laterality Date   APPENDECTOMY     BIOPSY  09/27/2017   Procedure: BIOPSY;  Surgeon: Daneil Dolin, MD;  [facility]: [facility];  Service: Endoscopy;;  ileocecal valve   CESAREAN SECTION     X 3   COLONOSCOPY     COLONOSCOPY  07/23/2012   Procedure: COLONOSCOPY;  Surgeon: Daneil Dolin, MD;  Location: [facility];  Service: Endoscopy;  Laterality: N/A;  7:30 AM   COLONOSCOPY N/A 09/27/2017   Procedure: COLONOSCOPY;  Surgeon: Daneil Dolin, MD;  Location: [facility];  Service: Endoscopy;  Laterality: N/A;  8:30   TONSILLECTOMY  TUBAL LIGATION       Home Medications:  Prior to Admission medications   Medication Sig Start Date End Date Taking? Authorizing Provider  aspirin EC 81 MG tablet Take 81 mg by mouth at bedtime.   Yes [provider]  atorvastatin (LIPITOR) 10 MG tablet Take 10 mg by mouth at bedtime.    Yes [provider]  brimonidine-timolol (COMBIGAN) 0.2-0.5 % ophthalmic solution INSTILL ONE DROP IN BOTH  EYES TWO  TIMES DAILY 10/28/16  Yes [provider]  Clobetasol Prop Emollient Base 0.05 % emollient cream Use bid to affected areas for 2 weeks then 3 x a week 11/20/19  Yes Derrek Monaco A, NP  dorzolamide (TRUSOPT) 2 % ophthalmic solution Place 1 drop into both eyes 2 (two) times daily. 07/08/17  Yes [provider]  gabapentin (NEURONTIN) 300 MG capsule Take 300 mg by mouth daily. 02/18/21  Yes [provider]  glimepiride (AMARYL) 1 MG tablet Take 1 mg by mouth daily before breakfast.    Yes [provider]  latanoprost (XALATAN) 0.005 % ophthalmic solution Place 1 drop into both eyes 2 (two) times daily.   Yes [provider]  liraglutide (VICTOZA) 18 MG/3ML SOPN Inject 1.2 mg into the skin at bedtime.    Yes [provider]  losartan (COZAAR) 100 MG tablet Take 100 mg by mouth daily. 07/15/19  Yes [provider]  metFORMIN (GLUCOPHAGE) 1000 MG tablet Take 1,000 mg by mouth 2 (two) times daily.   Yes [provider]  Multiple Vitamin (MULTIVITAMIN WITH MINERALS) TABS tablet Take 1 tablet by mouth at bedtime.   Yes [provider]  amLODipine (NORVASC) 2.5 MG tablet Take 2.5 mg by mouth daily. Patient not taking: Reported on 03/24/2021 07/29/19   [provider]  chlorthalidone (HYGROTON) 25 MG tablet Take 25 mg by mouth daily. Patient not taking: Reported on 03/24/2021 01/29/21   [provider]    Inpatient Medications: Scheduled Meds:  aspirin EC  81 mg Oral Daily   atorvastatin  10 mg Oral QHS   brimonidine  1 drop Both Eyes TID   Chlorhexidine Gluconate Cloth  6 each Topical Daily   dorzolamide  1 drop Both Eyes BID   enoxaparin (LOVENOX) injection  100 mg Subcutaneous Q12H   gabapentin  300 mg Oral Daily   insulin aspart  0-5 Units Subcutaneous QHS   insulin aspart  0-9 Units Subcutaneous TID WC   latanoprost  1 drop Both Eyes BID   sodium chloride flush  3 mL Intravenous Q12H   sodium chloride  flush  3 mL Intravenous Q12H   timolol  1 drop Both Eyes BID   Continuous Infusions:  sodium chloride     amiodarone 30 mg/hr (03/24/21 1824)   magnesium sulfate bolus IVPB 2 g (03/25/21 0857)   PRN Meds: sodium chloride, acetaminophen, ondansetron (ZOFRAN) IV, sodium chloride flush  Allergies:    Allergies  Allergen Reactions   Macrobid [Nitrofurantoin Macrocrystal] Nausea And Vomiting   Codeine Nausea And Vomiting   Sulfa Antibiotics Other (See Comments)    Pt cannot remember   Ciprofloxacin     Decreased BP    Social History:   Social History   Socioeconomic History   Marital status: Widowed    Spouse name: Not on file   Number of children: Not on file   Years of education: Not on file   Highest education level: Not on file  Occupational History   Not on  file  Tobacco Use   Smoking status: Never   Smokeless tobacco: Never  Vaping Use   Vaping Use: Never used  Substance and Sexual Activity   Alcohol use: No   Drug use: No   Sexual activity: Not Currently    Birth control/protection: Post-menopausal, Surgical    Comment: tubal  Other Topics Concern   Not on file  Social History Narrative   Not on file   Social Determinants of Health   Financial Resource Strain: Low Risk    Difficulty of Paying Living Expenses: Not hard at all  Food Insecurity: No Food Insecurity   Worried About Charity fundraiser in the Last Year: Never true   [place] in the Last Year: Never true  Transportation Needs: No Transportation Needs   Lack of Transportation (Medical): No   Lack of Transportation (Non-Medical): No  Physical Activity: Insufficiently Active   Days of Exercise per Week: 2 days   Minutes of Exercise per Session: 30 min  Stress: No Stress Concern Present   Feeling of Stress : Not at all  Social Connections: Moderately Integrated   Frequency of Communication with Friends and Family: More than three times a week   Frequency of Social Gatherings with  Friends and Family: More than three times a week   Attends Religious Services: 1 to 4 times per year   Active Member of [employer] or Organizations: Yes   Attends Archivist Meetings: More than 4 times per year   Marital Status: Widowed  Human resources officer Violence: Not At Risk   Fear of Current or Ex-Partner: No   Emotionally Abused: No   Physically Abused: No   Sexually Abused: No    Family History:    Family History  Problem Relation Age of Onset   Varicose Veins Mother    Deep vein thrombosis Mother    Cancer Father    Varicose Veins Father    Deep vein thrombosis Father    Diabetes Father    Heart disease Father    Hypertension Father    Diabetes Sister    Heart disease Sister    Hyperlipidemia Sister    Hypertension Sister    Hypertension Son    Colon cancer Brother    Cancer Brother        stomach   Varicose Veins Daughter    Hypertension Son    Miscarriages / [place] Daughter      ROS:  Please see the history of present illness.   All other ROS reviewed and negative.     Physical Exam/Data:   Vitals:   03/25/21 0721 03/25/21 0730 03/25/21 0800 03/25/21 0900  BP:    135/74  Pulse: 81 91 83 85  Resp: 17 17 19  (!) 22  Temp: 98 F (36.7 C)     TempSrc: Oral     SpO2: 97% 97% 97% 97%  Weight:      Height:        Intake/Output Summary (Last 24 hours) at 03/25/2021 0933 Last data filed at 03/24/2021 1502 Gross per 24 hour  Intake 123.72 ml  Output 200 ml  Net -76.28 ml   Last 3 Weights 03/25/2021 03/24/2021 03/24/2021  Weight (lbs) 211 lb 3.2 oz 210 lb 5.1 oz 210 lb  Weight (kg) 95.8 kg 95.4 kg 95.255 kg     Body mass index is 34.09 kg/m.  General:  Well nourished, well developed female appearing in no acute distress. HEENT: normal Neck:  JVD at 10 cm.  Vascular: No carotid bruits; Distal pulses 2+ bilaterally Cardiac:  normal S1, S2; RRR with occasional ectopic beats; no murmur.  Lungs:  clear to auscultation bilaterally, no wheezing,  rhonchi or rales  Abd: soft, nontender, no hepatomegaly  Ext: trace ankle edema bilaterally. Musculoskeletal:  No deformities, BUE and BLE strength normal and equal Skin: warm and dry  Neuro:  CNs 2-12 intact, no focal abnormalities noted Psych:  Normal affect   Telemetry:  Telemetry was personally reviewed and demonstrates:  NSR overnight with HR in the 70's to 90's. Occasional PVC's and episodes of NSVT with the longest being 14 beats.   Relevant CV Studies:  Echocardiogram: 03/24/2021 IMPRESSIONS     1. Left ventricular ejection fraction, by estimation, is 25 to 30%. The  left ventricle has severely decreased function. The left ventricle  demonstrates regional wall motion abnormalities (see scoring  diagram/findings for description - diffuse  hypokinesis with relative preservation of basal contraction, although not  clearly stress-induced cardiomyopathy pattern). The left ventricular  internal cavity size was mildly dilated. There is moderate left  ventricular hypertrophy. Left ventricular  diastolic parameters are consistent with Grade II diastolic dysfunction  (pseudonormalization).   2. Right ventricular systolic function is moderately reduced. The right  ventricular size is mildly enlarged. There is mildly elevated pulmonary  artery systolic pressure. The estimated right ventricular systolic  pressure is 03.5 mmHg.   3. Left atrial size was mild to moderately dilated.   4. Right atrial size was mild to moderately dilated.   5. There is a trivial pericardial effusion posterior to the left  ventricle.   6. The mitral valve is grossly normal. Mild mitral valve regurgitation.  The mean mitral valve gradient is 2.0 mmHg.   7. The aortic valve is tricuspid. Aortic valve regurgitation is not  visualized. Mild aortic valve sclerosis is present, with no evidence of  aortic valve stenosis. Aortic valve mean gradient measures 4.0 mmHg.   Laboratory Data:  High Sensitivity  Troponin:   Recent Labs  Lab 03/24/21 0722 03/24/21 0917 03/24/21 1833 03/24/21 2138 03/25/21 0057  TROPONINIHS 14 34* 51* 46* 37*     Chemistry Recent Labs  Lab 03/24/21 0722 03/25/21 0440  NA 139 140  K 3.7 3.6  CL 106 105  CO2 24 28  GLUCOSE 247* 147*  BUN 18 13  CREATININE 0.73 0.64  CALCIUM 9.4 8.7*  MG 1.8 1.8  GFRNONAA >60 >60  ANIONGAP 9 7    Recent Labs  Lab 03/24/21 0722 03/25/21 0440  PROT 6.9 6.2*  ALBUMIN 4.2 3.8  AST 25 20  ALT 21 17  ALKPHOS 45 40  BILITOT 1.0 1.0   Lipids  Recent Labs  Lab 03/25/21 0440  CHOL 98  TRIG 175*  HDL 32*  LDLCALC 31  CHOLHDL 3.1    Hematology Recent Labs  Lab 03/24/21 0722 03/25/21 0440  WBC 7.5 7.6  RBC 4.47 4.35  HGB 14.0 13.3  HCT 39.6 39.1  MCV 88.6 89.9  MCH 31.3 30.6  MCHC 35.4 34.0  RDW 13.3 13.6  PLT 138* 145*   Thyroid  Recent Labs  Lab 03/24/21 0727  TSH 4.613*    BNP Recent Labs  Lab 03/24/21 0722  BNP 1,119.0*    DDimer  Recent Labs  Lab 03/24/21 0722  DDIMER 0.38     Radiology/Studies:  DG Chest Port 1 View  Result Date: 03/24/2021 CLINICAL DATA:  sob EXAM: PORTABLE CHEST 1 VIEW COMPARISON:  None. FINDINGS: The heart appears enlarged, which may be exaggerated due to projection. No pleural effusion. No pneumothorax. Increased interstitial markings. No lobar consolidation. No acute osseous abnormality. IMPRESSION: The heart appears enlarged with increased interstitial markings, suggestive of mild pulmonary edema. Electronically Signed   By: Albin Felling M.D.   On: 03/24/2021 08:23   Assessment and Plan:   1. Acute HFrEF/New Cardiomyopathy - Presented with acute dyspnea starting the day of admission. BNP at 1119 on admission and Hs Troponin values have been flat. Echo shows a newly reduced EF of 25-30% as outlined above with WMA.  - Given her new cardiomyopathy, will plan for [facility] today. She did have a few bites of food this AM so cath will be this afternoon. The  patient understands that risks include but are not limited to stroke (1 in 1000), death (1 in 42), kidney failure [usually temporary] (1 in 500), bleeding (1 in 200), allergic reaction [possibly serious] (1 in 200). If cath is consistent with NICM, she would likely benefit from a cMRI given her history of pulmonary sarcoidosis.  - She received IV Lasix yesterday and has noticed improvement in her dyspnea. Will hold additional IV Lasix for now in anticipation of her cath but she will likely require additional diuresis afterwards.  - Start Coreg 3.125mg  BID. She was on Losartan PTA but would plan to switch to [facility] following her cath and likely add an SGLT2-inhibitor and Spironolactone prior to discharge.   2. Questionable Atrial Fibrillation - By review of telemetry, it appears she was likely in an atrial tachycardia while in the ED as intermittent p-waves are noted. She was started on IV Amiodarone and her ectopy has improved. Will plan to start Coreg 3.125mg  BID for this and her cardiomyopathy. TSH elevated to 4.613. Will check Free T4.  - Her CHA2DS2-VASc Score is at least 5. Will review with Dr. Domenic Polite but strips thus far appears most consistent with an atrial tachycardia. She remains on full-dose Lovenox currently.   3. HTN - Her BP has been stable, at 135/74 on most recent check. She was on Losartan, Amlodipine and Chlorthalidone prior to admission. All currently held. Planning to start Coreg as outlined above and would anticipate additional medication changes following her cath.   4. HLD - FLP this admission shows total cholesterol of 98 with triglycerides 175 and LDL 31. Continue PTA Atorvastatin 10mg  daily.   5. NSVT - She did have 14 beats NSVT overnight. K+ at 3.6 this AM and Mg at 1.8. She already received Mg and K+ supplementation around 0900. Keep K+ ~ 4.0 and Mg ~ 2.0.  6. Type 2 DM - Hgb A1c pending. Holding PTA medications. SSI ordered.   Risk Assessment/Risk Scores:     New York Heart Association (NYHA) Functional Class NYHA Class III  CHA2DS2-VASc Score = 5   This indicates a 7.2% annual risk of stroke. The patient's score is based upon: CHF History: 1 HTN History: 1 Diabetes History: 1 Stroke History: 0 Vascular Disease History: 0 Age Score: 1 Gender Score: 1   For questions or updates, please contact [place] Please consult [email or web address] for contact info under    Signed, Erma Heritage, PA-C  03/25/2021 9:33 AM   Attending note:  Patient seen and examined.  I reviewed her records and discussed case with Ms. Ahmed Prima PA-C, I agree with her above findings.  Ms. Stoker presents to the hospital with fairly sudden onset shortness of breath that occurred yesterday morning at  rest.  No associated chest discomfort or palpitations at the time.  In retrospect she has felt somewhat more short of breath with routine activity in the last few weeks, no orthopnea or PND, no progressive leg swelling.  Chest x-ray showed cardiomegaly with pulmonary edema and there was initial concern that she could have been atrial fibrillation although review of her telemetry and ECG shows intermittent sinus beats with a substantial degree of atrial ectopy as well as some PVCs, no clear sustained atrial fibrillation.  High-sensitivity troponin I levels have been minimally elevated in the 30-50 range not suggestive of ACS.  BNP 1119 and COVID testing negative.  She reports a remote history of pulmonary sarcoidosis without obvious routine follow-up.  Otherwise hypertension and type 2 diabetes mellitus at baseline.  With diuresis she feels much better today.  She was started on amiodarone in the ER.  Rhythm today is clearly sinus with burst of NSVT overnight.  She is sitting in bedside chair.  Afebrile.  Heart rate in the 70s to 80s in sinus rhythm.  Systolic blood pressure 160-109.  Lungs are clear.  Cardiac exam with RRR and no gallop.  No peripheral edema.  Pertinent lab  work includes potassium 3.6, BUN 13, creatinine 0.64, AST 20, ALT 17, peak high-sensitivity troponin I 51, LDL 31, lactic acid 1.8, hemoglobin 13.3, platelets 145, TSH 4.61.  Echocardiogram demonstrates LVEF 25 to 30% range with diffuse hypokinesis and relative preservation of basal contraction although not clearly a stress-induced cardiomyopathy pattern.  Also moderate LVH and moderate diastolic dysfunction, moderately reduced RV contraction with RVSP 42 mmHg, mild to moderate biatrial enlargement, trivial pericardial effusion.  Patient presents with acute systolic heart failure, biventricular dysfunction by echocardiogram.  She has a remote history of pulmonary sarcoidosis without obvious flareup or follow-up therapies, also hypertension and type 2 diabetes mellitus at baseline.  Cardiac enzymes are not diagnostic of ACS.  Not clear that she has had any persistent atrial fibrillation so far.  Situation discussed with patient and daughter with recommendation to transfer for right and left heart catheterization at [facility] later today.  If she does not have obstructive CAD, anticipate further work-up with cardiac MRI and also input from the advanced heart failure team to assist with further medication adjustments.  For now continue aspirin and Lipitor, starting low-dose Coreg, will stop IV amiodarone.  Satira Sark, M.D., F.A.C.C.

## 2021-03-26 ENCOUNTER — Encounter (HOSPITAL_COMMUNITY): Payer: Self-pay | Admitting: Cardiovascular Disease

## 2021-03-26 ENCOUNTER — Inpatient Hospital Stay (HOSPITAL_COMMUNITY): Payer: Medicare PPO

## 2021-03-26 ENCOUNTER — Other Ambulatory Visit (HOSPITAL_COMMUNITY): Payer: Self-pay

## 2021-03-26 DIAGNOSIS — D869 Sarcoidosis, unspecified: Secondary | ICD-10-CM

## 2021-03-26 DIAGNOSIS — I5041 Acute combined systolic (congestive) and diastolic (congestive) heart failure: Secondary | ICD-10-CM

## 2021-03-26 DIAGNOSIS — R000 Tachycardia, unspecified: Secondary | ICD-10-CM

## 2021-03-26 LAB — POCT I-STAT EG7
Acid-Base Excess: 2 mmol/L (ref 0.0–2.0)
Acid-Base Excess: 3 mmol/L — ABNORMAL HIGH (ref 0.0–2.0)
Bicarbonate: 28.3 mmol/L — ABNORMAL HIGH (ref 20.0–28.0)
Bicarbonate: 28.6 mmol/L — ABNORMAL HIGH (ref 20.0–28.0)
Calcium, Ion: 1.2 mmol/L (ref 1.15–1.40)
Calcium, Ion: 1.2 mmol/L (ref 1.15–1.40)
HCT: 37 % (ref 36.0–46.0)
HCT: 37 % (ref 36.0–46.0)
Hemoglobin: 12.6 g/dL (ref 12.0–15.0)
Hemoglobin: 12.6 g/dL (ref 12.0–15.0)
O2 Saturation: 62 %
O2 Saturation: 66 %
Potassium: 4.2 mmol/L (ref 3.5–5.1)
Potassium: 4.2 mmol/L (ref 3.5–5.1)
Sodium: 143 mmol/L (ref 135–145)
Sodium: 143 mmol/L (ref 135–145)
TCO2: 30 mmol/L (ref 22–32)
TCO2: 30 mmol/L (ref 22–32)
pCO2, Ven: 48.1 mmHg (ref 44.0–60.0)
pCO2, Ven: 48.1 mmHg (ref 44.0–60.0)
pH, Ven: 7.378 (ref 7.250–7.430)
pH, Ven: 7.382 (ref 7.250–7.430)
pO2, Ven: 33 mmHg (ref 32.0–45.0)
pO2, Ven: 35 mmHg (ref 32.0–45.0)

## 2021-03-26 LAB — CBC
HCT: 36.1 % (ref 36.0–46.0)
Hemoglobin: 12.5 g/dL (ref 12.0–15.0)
MCH: 30.3 pg (ref 26.0–34.0)
MCHC: 34.6 g/dL (ref 30.0–36.0)
MCV: 87.6 fL (ref 80.0–100.0)
Platelets: 133 10*3/uL — ABNORMAL LOW (ref 150–400)
RBC: 4.12 MIL/uL (ref 3.87–5.11)
RDW: 13.4 % (ref 11.5–15.5)
WBC: 6.2 10*3/uL (ref 4.0–10.5)
nRBC: 0 % (ref 0.0–0.2)

## 2021-03-26 LAB — COMPREHENSIVE METABOLIC PANEL
ALT: 16 U/L (ref 0–44)
AST: 17 U/L (ref 15–41)
Albumin: 3.4 g/dL — ABNORMAL LOW (ref 3.5–5.0)
Alkaline Phosphatase: 36 U/L — ABNORMAL LOW (ref 38–126)
Anion gap: 7 (ref 5–15)
BUN: 9 mg/dL (ref 8–23)
CO2: 26 mmol/L (ref 22–32)
Calcium: 8.5 mg/dL — ABNORMAL LOW (ref 8.9–10.3)
Chloride: 107 mmol/L (ref 98–111)
Creatinine, Ser: 0.73 mg/dL (ref 0.44–1.00)
GFR, Estimated: >60 mL/min (ref >60)
Glucose, Bld: 131 mg/dL — ABNORMAL HIGH (ref 70–99)
Potassium: 4.2 mmol/L (ref 3.5–5.1)
Sodium: 140 mmol/L (ref 135–145)
Total Bilirubin: 1.3 mg/dL — ABNORMAL HIGH (ref 0.3–1.2)
Total Protein: 6.1 g/dL — ABNORMAL LOW (ref 6.5–8.1)

## 2021-03-26 LAB — HEPATIC FUNCTION PANEL
ALT: 18 U/L (ref 0–44)
AST: 21 U/L (ref 15–41)
Albumin: 3.8 g/dL (ref 3.5–5.0)
Alkaline Phosphatase: 40 U/L (ref 38–126)
Bilirubin, Direct: 0.2 mg/dL (ref 0.0–0.2)
Indirect Bilirubin: 1.2 mg/dL — ABNORMAL HIGH (ref 0.3–0.9)
Total Bilirubin: 1.4 mg/dL — ABNORMAL HIGH (ref 0.3–1.2)
Total Protein: 6.5 g/dL (ref 6.5–8.1)

## 2021-03-26 LAB — POCT I-STAT 7, (LYTES, BLD GAS, ICA,H+H)
Acid-Base Excess: 1 mmol/L (ref 0.0–2.0)
Bicarbonate: 26.2 mmol/L (ref 20.0–28.0)
Calcium, Ion: 1.17 mmol/L (ref 1.15–1.40)
HCT: 37 % (ref 36.0–46.0)
Hemoglobin: 12.6 g/dL (ref 12.0–15.0)
O2 Saturation: 98 %
Potassium: 4.1 mmol/L (ref 3.5–5.1)
Sodium: 143 mmol/L (ref 135–145)
TCO2: 27 mmol/L (ref 22–32)
pCO2 arterial: 41.5 mmHg (ref 32.0–48.0)
pH, Arterial: 7.408 (ref 7.350–7.450)
pO2, Arterial: 107 mmHg (ref 83.0–108.0)

## 2021-03-26 LAB — HEMOGLOBIN A1C
Hgb A1c MFr Bld: 6.1 % — ABNORMAL HIGH (ref 4.8–5.6)
Mean Plasma Glucose: 128 mg/dL

## 2021-03-26 LAB — GLUCOSE, CAPILLARY
Glucose-Capillary: 192 mg/dL — ABNORMAL HIGH (ref 70–99)
Glucose-Capillary: 160 mg/dL — ABNORMAL HIGH (ref 70–99)
Glucose-Capillary: 112 mg/dL — ABNORMAL HIGH (ref 70–99)
Glucose-Capillary: 154 mg/dL — ABNORMAL HIGH (ref 70–99)

## 2021-03-26 LAB — T4, FREE: Free T4: 0.86 ng/dL (ref 0.61–1.12)

## 2021-03-26 LAB — MAGNESIUM: Magnesium: 2.1 mg/dL (ref 1.7–2.4)

## 2021-03-26 MED ORDER — FOLIC ACID 1 MG PO TABS
1.0000 mg | ORAL_TABLET | Freq: Every day | ORAL | Status: DC
  Administered 2021-03-26 – 2021-03-27 (×2): 1 mg via ORAL
  Filled 2021-03-26 (×2): qty 1

## 2021-03-26 MED ORDER — SPIRONOLACTONE 12.5 MG HALF TABLET
12.5000 mg | ORAL_TABLET | Freq: Every day | ORAL | Status: DC
  Administered 2021-03-26 – 2021-03-27 (×2): 12.5 mg via ORAL
  Filled 2021-03-26 (×2): qty 1

## 2021-03-26 MED ORDER — GADOBUTROL 1 MMOL/ML IV SOLN
10.0000 mL | Freq: Once | INTRAVENOUS | Status: AC | PRN
  Administered 2021-03-26: 10 mL via INTRAVENOUS

## 2021-03-26 MED ORDER — PREDNISONE 20 MG PO TABS
30.0000 mg | ORAL_TABLET | Freq: Every day | ORAL | Status: DC
  Administered 2021-03-27: 30 mg via ORAL
  Filled 2021-03-26: qty 1

## 2021-03-26 MED ORDER — SULFAMETHOXAZOLE-TRIMETHOPRIM 800-160 MG PO TABS: 1.0000 | ORAL_TABLET | ORAL | Status: DC

## 2021-03-26 MED ORDER — METHOTREXATE 2.5 MG PO TABS
10.0000 mg | ORAL_TABLET | ORAL | Status: DC
  Administered 2021-03-26: 10 mg via ORAL
  Filled 2021-03-26 (×2): qty 4

## 2021-03-26 MED ORDER — ENOXAPARIN SODIUM 40 MG/0.4ML IJ SOSY
40.0000 mg | PREFILLED_SYRINGE | INTRAMUSCULAR | Status: DC
  Administered 2021-03-27: 40 mg via SUBCUTANEOUS
  Filled 2021-03-26: qty 0.4

## 2021-03-26 MED ORDER — SACUBITRIL-VALSARTAN 24-26 MG PO TABS
1.0000 | ORAL_TABLET | Freq: Two times a day (BID) | ORAL | Status: DC
  Administered 2021-03-27: 1 via ORAL
  Filled 2021-03-26: qty 1

## 2021-03-26 MED ORDER — CALCIUM CARBONATE-VITAMIN D 500-200 MG-UNIT PO TABS
2.0000 | ORAL_TABLET | Freq: Every day | ORAL | Status: DC
  Administered 2021-03-27: 2 via ORAL
  Filled 2021-03-26: qty 2

## 2021-03-26 MED ORDER — PANTOPRAZOLE SODIUM 20 MG PO TBEC
20.0000 mg | DELAYED_RELEASE_TABLET | Freq: Every evening | ORAL | Status: DC
  Administered 2021-03-26: 20 mg via ORAL
  Filled 2021-03-26: qty 1

## 2021-03-26 NOTE — Plan of Care (Signed)
  Problem: Education: Goal: Knowledge of General Education information will improve Description: Including pain rating scale, medication(s)/side effects and non-pharmacologic comfort measures Outcome: Progressing   Problem: Health Behavior/Discharge Planning: Goal: Ability to manage health-related needs will improve Outcome: Progressing   Problem: Clinical Measurements: Goal: Ability to maintain clinical measurements within normal limits will improve Outcome: Progressing   Problem: Clinical Measurements: Goal: Respiratory complications will improve Outcome: Progressing   Problem: Clinical Measurements: Goal: Cardiovascular complication will be avoided Outcome: Progressing   Problem: Pain Managment: Goal: General experience of comfort will improve Outcome: Progressing   Problem: Safety: Goal: Ability to remain free from injury will improve Outcome: Progressing   Problem: Skin Integrity: Goal: Risk for impaired skin integrity will decrease Outcome: Progressing

## 2021-03-26 NOTE — TOC Initial Note (Addendum)
Transition of Care [facility]) - Initial/Assessment Note    Patient Details  Name: [name] MRN: [id number] Date of Birth: 1951-01-13  Transition of Care [facility]) CM/SW Contact:    [name] Phone Number: 03/26/2021, 11:32 AM  Clinical Narrative:                 [facility] CSW spoke with [name] and her daughter, [name] at bedside and completed a very brief SDOH with the patient who denied having any needs at this time. [name] reported she does have a PCP and she can get to the pharmacy to pick up her medications. [name] reported that she is still driving and is very independent and lives alone and has support from family if needed. CSW provided the patient with the social workers name and position and if anything changes to please reach out so that the CSW can provide support. [name] reported that her daughter will provide transportation home at time of discharge.   Expected Discharge Plan: Home/Self Care Barriers to Discharge: No Barriers Identified   Patient Goals and CMS Choice        Expected Discharge Plan and Services Expected Discharge Plan: Home/Self Care In-house Referral: Clinical Social Work     Living arrangements for the past 2 months: Single Family Home                                      Prior Living Arrangements/Services Living arrangements for the past 2 months: Single Family Home Lives with:: Self Patient language and need for interpreter reviewed:: Yes        Need for Family Participation in Patient Care: No (Comment) Care giver support system in place?: No (comment)   Criminal Activity/Legal Involvement Pertinent to Current Situation/Hospitalization: No - Comment as needed  Activities of Daily Living Home Assistive Devices/Equipment: None ADL Screening (condition at time of admission) Patient's cognitive ability adequate to safely complete daily activities?: Yes Is the patient deaf or have difficulty hearing?: Yes Does the patient have  difficulty seeing, even when wearing glasses/contacts?: No Does the patient have difficulty concentrating, remembering, or making decisions?: No Patient able to express need for assistance with ADLs?: Yes Does the patient have difficulty dressing or bathing?: No Independently performs ADLs?: Yes (appropriate for developmental age) Does the patient have difficulty walking or climbing stairs?: Yes Weakness of Legs: Both Weakness of Arms/Hands: None  Permission Sought/Granted                  Emotional Assessment Appearance:: Appears stated age Attitude/Demeanor/Rapport: Engaged Affect (typically observed): Pleasant Orientation: : Oriented to Self, Oriented to Place, Oriented to  Time, Oriented to Situation   Psych Involvement: No (comment)  Admission diagnosis:  Acute CHF (congestive heart failure) ([place]) [I50.9] Patient Active Problem List   Diagnosis Date Noted   New onset atrial fibrillation ([place]) 03/24/2021   SOB (shortness of breath) 03/24/2021   Acute CHF (congestive heart failure) ([place]) 40/98/1191   Systolic and diastolic CHF, acute ([place]) 03/24/2021   Injury of breast 01/06/2021   Mass of upper outer quadrant of right breast 01/06/2021   Memory difficulties 01/06/2021   Encounter for screening fecal occult blood testing 11/25/2020   Encounter for well woman exam with routine gynecological exam 11/25/2020   Screening for colorectal cancer 11/20/2019   Encounter for gynecological examination with Papanicolaou smear of cervix 11/20/2019  Routine cervical smear 11/20/2019   Vulvar dystrophy 41/32/4401   Lichen sclerosus et atrophicus 12/02/2016   Encounter for colorectal cancer screening 12/02/2016   Vaginal dryness 04/01/2014   Atypical nevi 04/01/2014   Varicose veins of leg with pain 03/27/2014   PCP:  Paige Noble, MD Pharmacy:   [facility], [place] [address] [place] [address] Phone: [phone] Fax:  [phone]     Social Determinants of Health (SDOH) Interventions Food Insecurity Interventions: Intervention Not Indicated Financial Strain Interventions: Intervention Not Indicated Housing Interventions: Intervention Not Indicated Transportation Interventions: Intervention Not Indicated  Readmission Risk Interventions No flowsheet data found.  [name], MSW, [place] Heart Failure Social Worker

## 2021-03-26 NOTE — TOC Benefit Eligibility Note (Signed)
Patient Teacher, English as a foreign language completed.    The patient is currently admitted and upon discharge could be taking Eliquis 5 mg.  The current 30 day co-pay is, $40.00.   The patient is currently admitted and upon discharge could be taking Entresto 24-26 mg.  The current 30 day co-pay is, $40.00.   The patient is currently admitted and upon discharge could be taking Jardiance 10 mg.  The current 30 day co-pay is, $40.00.   The patient is currently admitted and upon discharge could be taking Farxiga 10 mg.  The current 30 day co-pay is, $64.00.   The patient is insured through [place] Patient Advocate Specialist [place] Team Direct Number: [phone]  Fax: [phone]

## 2021-03-26 NOTE — Consult Note (Addendum)
Advanced Heart Failure Team Consult Note   Primary Physician: Asencion Noble, MD PCP-Cardiologist:  Dr. Domenic Polite   Reason for Consultation: New Systolic Heart Failure/ NICM. Concern for Cardiac Sarcoid   HPI:    [name] is seen today for evaluation of new systolic heart failure, concerning for cardiac sarcoid, at the request of Dr. Domenic Polite, Cardiology.   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 w/ acute onset dyspnea and found to be in acute CHF and atrial tachycardia, initially concerning for atrial fibrillation. Also noted to have frequent ventricular ectopy w/ PVCs and NSVT. Initial Mg 1.8, K 3.7. TSH mildly elevated at 4.6. T4 normal.   CXR showed cardiomegaly with increased interstitial markings suggestive of mild pulmonary edema. BNP 1119. D-dimer negative. HS trop 34>51>46>37. She was started on IV Lasix, IV amio and heparin. 2D Echo showed biventricular dysfunction. LVEF 25-30% w/ multiple RWMAs (see LV wall scoring below), moderate LVH. RV moderately reduced. Subsequently, she was transferred to [facility] for [facility] and advanced HF consultation.   R/LHC demonstrated normal coronaries and mildly elevated filling pressures with an LVEDP of 17 and a mean wedge of 20.  Her cardiac output is 4.4 L/min with an index of 2.16 L/min/m. There is concern for possible cardiac sarcoid and she awaiting cMRI.   She has been well diuresed. Volume status improved and dyspnea resolved. She is on losartan and Coreg. SCr 0.73. K 4.2. SBPs 120s-140s. She is back in NSR w/ occasional PVCs and once 10 beat run of NSVT on tele.     Echo 03/24/21 Left ventricular ejection fraction, by estimation, is 25 to 30%. The left ventricle has severely decreased function. The left ventricle demonstrates regional wall motion abnormalities (see scoring diagram/findings for  description - diffuse hypokinesis with relative preservation of basal contraction, although not clearly stress-induced cardiomyopathy pattern). The left ventricular internal cavity size was mildly dilated. There is moderate left ventricular hypertrophy. Left ventricular diastolic parameters are consistent with Grade II diastolic dysfunction (pseudonormalization). 1. Right ventricular systolic function is moderately reduced. The right ventricular size is mildly enlarged. There is mildly elevated pulmonary artery systolic pressure. The estimated right ventricular systolic pressure is 54.0 mmHg. 2. 3. Left atrial size was mild to moderately dilated. 4. Right atrial size was mild to moderately dilated. 5. There is a trivial pericardial effusion posterior to the left ventricle. The mitral valve is grossly normal. Mild mitral valve regurgitation. The mean mitral valve gradient is 2.0 mmHg. 6. The aortic valve is tricuspid. Aortic valve regurgitation is not visualized. Mild aortic valve sclerosis is present, with no evidence of aortic valve stenosis. Aortic valve mean gradient measures 4.0 mmHg.  LV Wall Scoring: The mid and distal anterior wall, mid and distal lateral wall, mid and distal anterior septum, entire apex, mid and distal inferior wall, posterior wall, mid anterolateral segment, and mid inferoseptal segment are hypokinetic. The basal anteroseptal segment, basal anterolateral segment, basal anterior segment, basal inferior segment, and basal inferoseptal segment are normal.  R/LHC 03/25/21 Angiogram = normal coronaries Fick CO 4.43 L/min Fick CI 2.16 L/min/m mRAP 4 mPAP 30  LVEDP 17   Review of Systems: [y] = yes, [ ]  = no   General: Weight gain [ ] ; Weight loss [ ] ; Anorexia [ ] ; Fatigue [ ] ; Fever [ ] ; Chills [ ] ; Weakness [ ]   Cardiac: Chest pain/pressure [ Y]; Resting SOB [ Y]; Exertional SOB [Y ]; Orthopnea [ ] ; Pedal Edema [ ] ; Palpitations [ Y]; Syncope [ ] ;  Presyncope [ ] ; Paroxysmal nocturnal dyspnea[ ]   Pulmonary: Cough [ ] ; Wheezing[ Y]; Hemoptysis[ ] ; Sputum [ ] ; Snoring [ ]   GI: Vomiting[ ] ; Dysphagia[ ] ; Melena[ ] ; Hematochezia [ ] ; Heartburn[ ] ; Abdominal pain [ ] ; Constipation [ ] ; Diarrhea [ ] ; BRBPR [ ]   GU: Hematuria[ ] ; Dysuria [ ] ; Nocturia[ ]   Vascular: Pain in legs with walking [ ] ; Pain in feet with lying flat [ ] ; Non-healing sores [ ] ; Stroke [ ] ; TIA [ ] ; Slurred speech [ ] ;  Neuro: Headaches[ ] ; Vertigo[ ] ; Seizures[ ] ; Paresthesias[ ] ;Blurred vision [ ] ; Diplopia [ ] ; Vision changes [ ]   Ortho/Skin: Arthritis [ ] ; Joint pain [ ] ; Muscle pain [ ] ; Joint swelling [ ] ; Back Pain [ ] ; Rash [ ]   Psych: Depression[ ] ; Anxiety[ ]   Heme: Bleeding problems [ ] ; Clotting disorders [ ] ; Anemia [ ]   Endocrine: Diabetes [ ] ; Thyroid dysfunction[ ]   Home Medications Prior to Admission medications   Medication Sig Start Date End Date Taking? Authorizing Provider  aspirin EC 81 MG tablet Take 81 mg by mouth at bedtime.   Yes [provider]  atorvastatin (LIPITOR) 10 MG tablet Take 10 mg by mouth at bedtime.    Yes [provider]  brimonidine-timolol (COMBIGAN) 0.2-0.5 % ophthalmic solution INSTILL ONE DROP IN BOTH  EYES TWO TIMES DAILY 10/28/16  Yes [provider]  Clobetasol Prop Emollient Base 0.05 % emollient cream Use bid to affected areas for 2 weeks then 3 x a week 11/20/19  Yes Derrek Monaco A, NP  dorzolamide (TRUSOPT) 2 % ophthalmic solution Place 1 drop into both eyes 2 (two) times daily. 07/08/17  Yes [provider]  gabapentin (NEURONTIN) 300 MG capsule Take 300 mg by mouth daily. 02/18/21  Yes [provider]  glimepiride (AMARYL) 1 MG tablet Take 1 mg by mouth daily before breakfast.    Yes [provider]  latanoprost (XALATAN) 0.005 % ophthalmic solution Place 1 drop into both eyes 2 (two) times daily.   Yes [provider]  liraglutide (VICTOZA) 18 MG/3ML SOPN  Inject 1.2 mg into the skin at bedtime.    Yes [provider]  losartan (COZAAR) 100 MG tablet Take 100 mg by mouth daily. 07/15/19  Yes [provider]  metFORMIN (GLUCOPHAGE) 1000 MG tablet Take 1,000 mg by mouth 2 (two) times daily.   Yes [provider]  Multiple Vitamin (MULTIVITAMIN WITH MINERALS) TABS tablet Take 1 tablet by mouth at bedtime.   Yes [provider]  amLODipine (NORVASC) 2.5 MG tablet Take 2.5 mg by mouth daily. Patient not taking: Reported on 03/24/2021 07/29/19   [provider]  chlorthalidone (HYGROTON) 25 MG tablet Take 25 mg by mouth daily. Patient not taking: Reported on 03/24/2021 01/29/21   [provider]    Past Medical History: Past Medical History:  Diagnosis Date   Atypical nevi 04/01/2014   Glaucoma    Hyperlipidemia    Hypertension    Proximal humerus fracture    Right   Sarcoidosis    Type 2 diabetes mellitus ([place])    Varicose veins     Past Surgical History: Past Surgical History:  Procedure Laterality Date   APPENDECTOMY     BIOPSY  09/27/2017   Procedure: BIOPSY;  Surgeon: Daneil Dolin, MD;  [facility];  Service: Endoscopy;;  ileocecal valve   CESAREAN SECTION     X 3   COLONOSCOPY     COLONOSCOPY  07/23/2012   Procedure: COLONOSCOPY;  Surgeon: Daneil Dolin, MD;  Location: [facility];  Service: Endoscopy;  Laterality: N/A;  7:30 AM   COLONOSCOPY N/A 09/27/2017   Procedure: COLONOSCOPY;  Surgeon: Daneil Dolin, MD;  [facility]: [facility];  Service: Endoscopy;  Laterality: N/A;  8:30   RIGHT/LEFT HEART CATH AND CORONARY ANGIOGRAPHY N/A 03/25/2021   Procedure: RIGHT/LEFT HEART CATH AND CORONARY ANGIOGRAPHY;  Surgeon: Lorretta Harp, MD;  Location: [facility];  Service: Cardiovascular;  Laterality: N/A;   TONSILLECTOMY     TUBAL LIGATION      Family History: Family History  Problem Relation Age of Onset   Varicose Veins Mother    Deep vein thrombosis  Mother    Cancer Father    Varicose Veins Father    Deep vein thrombosis Father    Diabetes Father    Heart disease Father    Hypertension Father    Diabetes Sister    Heart disease Sister    Hyperlipidemia Sister    Hypertension Sister    Hypertension Son    Colon cancer Brother    Cancer Brother        stomach   Varicose Veins Daughter    Hypertension Son    Miscarriages / [place] Daughter     Social History: Social History   Socioeconomic History   Marital status: Widowed    Spouse name: Not on file   Number of children: Not on file   Years of education: Not on file   Highest education level: Not on file  Occupational History   Not on file  Tobacco Use   Smoking status: Never   Smokeless tobacco: Never  Vaping Use   Vaping Use: Never used  Substance and Sexual Activity   Alcohol use: No   Drug use: No   Sexual activity: Not Currently    Birth control/protection: Post-menopausal, Surgical    Comment: tubal  Other Topics Concern   Not on file  Social History Narrative   Not on file   Social Determinants of Health   Financial Resource Strain: Low Risk    Difficulty of Paying Living Expenses: Not hard at all  Food Insecurity: No Food Insecurity   Worried About Charity fundraiser in the Last Year: Never true   [place] in the Last Year: Never true  Transportation Needs: No Transportation Needs   Lack of Transportation (Medical): No   Lack of Transportation (Non-Medical): No  Physical Activity: Insufficiently Active   Days of Exercise per Week: 2 days   Minutes of Exercise per Session: 30 min  Stress: No Stress Concern Present   Feeling of Stress : Not at all  Social Connections: Moderately Integrated   Frequency of Communication with Friends and Family: More than three times a week   Frequency of Social Gatherings with Friends and Family: More than three times a week   Attends Religious Services: 1 to 4 times per year   Active Member of  [employer] or Organizations: Yes   Attends Archivist Meetings: More than 4 times per year   Marital Status: Widowed    Allergies:  Allergies  Allergen Reactions   Macrobid [Nitrofurantoin Macrocrystal] Nausea And Vomiting   Codeine Nausea And Vomiting   Sulfa Antibiotics Other (See  Comments)    Pt cannot remember   Ciprofloxacin     Decreased BP    Objective:    Vital Signs:   Temp:  [97.9 F (36.6 C)-98.5 F (36.9 C)] 98.4 F (36.9 C) (09/30 0430) Pulse Rate:  [63-85] 78 (09/30 0842) Resp:  [15-25] 20 (09/30 0430) BP: (88-145)/(36-101) 142/86 (09/30 0842) SpO2:  [0 %-99 %] 95 % (09/30 0430) Weight:  [96.3 kg] 96.3 kg (09/30 0430) Last BM Date: 03/24/21  Weight change: Filed Weights   03/24/21 1133 03/25/21 0444 03/26/21 0430  Weight: 95.4 kg 95.8 kg 96.3 kg    Intake/Output:   Intake/Output Summary (Last 24 hours) at 03/26/2021 0846 Last data filed at 03/26/2021 0400 Gross per 24 hour  Intake 637.25 ml  Output --  Net 637.25 ml      Physical Exam    General:  Well appearing, moderately obese WF. No resp difficulty HEENT: normal Neck: supple. JVP . Carotids 2+ bilat; no bruits. No lymphadenopathy or thyromegaly appreciated. Cor: PMI nondisplaced. Regular rate & rhythm. No rubs, gallops or murmurs. Lungs: clear Abdomen: soft, nontender, nondistended. No hepatosplenomegaly. No bruits or masses. Good bowel sounds. Extremities: no cyanosis, clubbing, rash, edema Neuro: alert & orientedx3, cranial nerves grossly intact. moves all 4 extremities w/o difficulty. Affect pleasant   Telemetry   NSR 80s w/ occasional PVCs, NSVT 7-10 beats, personally reviewed   EKG    Admit EKG: atrial tach 110 bpm   Labs   Basic Metabolic Panel: Recent Labs  Lab 03/24/21 0722 03/25/21 0440 03/26/21 0225  NA 139 140 140  K 3.7 3.6 4.2  CL 106 105 107  CO2 24 28 26   GLUCOSE 247* 147* 131*  BUN 18 13 9   CREATININE 0.73 0.64 0.73  CALCIUM 9.4 8.7* 8.5*  MG 1.8  1.8 2.1    Liver Function Tests: Recent Labs  Lab 03/24/21 0722 03/25/21 0440 03/26/21 0225  AST 25 20 17   ALT 21 17 16   ALKPHOS 45 40 36*  BILITOT 1.0 1.0 1.3*  PROT 6.9 6.2* 6.1*  ALBUMIN 4.2 3.8 3.4*   No results for input(s): LIPASE, AMYLASE in the last 168 hours. No results for input(s): AMMONIA in the last 168 hours.  CBC: Recent Labs  Lab 03/24/21 0722 03/25/21 0440 03/26/21 0225  WBC 7.5 7.6 6.2  NEUTROABS 5.0  --   --   HGB 14.0 13.3 12.5  HCT 39.6 39.1 36.1  MCV 88.6 89.9 87.6  PLT 138* 145* 133*    Cardiac Enzymes: No results for input(s): CKTOTAL, CKMB, CKMBINDEX, TROPONINI in the last 168 hours.  BNP: BNP (last 3 results) Recent Labs    03/24/21 0722  BNP 1,119.0*    ProBNP (last 3 results) No results for input(s): PROBNP in the last 8760 hours.   CBG: Recent Labs  Lab 03/25/21 1102 03/25/21 1343 03/25/21 1624 03/25/21 2104 03/26/21 0742  GLUCAP 181* 147* 141* 142* 192*    Coagulation Studies: Recent Labs    03/24/21 0722  LABPROT 13.0  INR 1.0     Imaging   CARDIAC CATHETERIZATION  Result Date: 03/25/2021 Images from the original result were not included. FARHA DANO is a 70 y.o. female  [id number] LOCATION:  FACILITY: [facility] PHYSICIAN: Quay Burow, M.D. 1950-06-28 DATE OF PROCEDURE:  03/25/2021 DATE OF DISCHARGE: CARDIAC CATHETERIZATION History obtained from chart review.BETHANI BRUGGER is a 70 y.o. female with a past medical history of pulmonary sarcoidosis (not currently requiring treatment), HTN, HLD, Type 2 DM  and Glaucoma who is being seen today for the evaluation of atrial fibrillation and new cardiomyopathy at the request of Dr. Sherral Hammers.  Her EF by 2D echo was approximately 25 to 30%.  Her enzymes were low.  She presents now for right left heart cath.   Ms. Boehner has clean coronary arteries and mildly elevated filling pressures with an LVEDP of 17 and a mean wedge of 20.  Her cardiac output is 4.4 L/min with an index of 2.16  L/min/m.  She has a nonischemic cardiomyopathy.  Guideline directed optimal medical therapy will be recommended including beta-blocker, Entresto plus or minus spironolactone and an SGL P2.  The radial sheath was removed and a TR band was placed on the right wrist to achieve patent hemostasis.  The patient left lab in stable condition. Quay Burow. MD, [facility] 03/25/2021 3:46 PM      Medications:     Current Medications:  aspirin  81 mg Oral Daily   atorvastatin  10 mg Oral QHS   brimonidine  1 drop Both Eyes TID   carvedilol  3.125 mg Oral BID WC   Chlorhexidine Gluconate Cloth  6 each Topical Daily   dorzolamide  1 drop Both Eyes BID   gabapentin  300 mg Oral QHS   glimepiride  1 mg Oral QAC breakfast   insulin aspart  0-5 Units Subcutaneous QHS   insulin aspart  0-9 Units Subcutaneous TID WC   latanoprost  1 drop Both Eyes BID   losartan  100 mg Oral Daily   sodium chloride flush  3 mL Intravenous Q12H   sodium chloride flush  3 mL Intravenous Q12H   sodium chloride flush  3 mL Intravenous Q12H   timolol  1 drop Both Eyes BID    Infusions:  sodium chloride     sodium chloride        Patient Profile   70 y/o female w/ h/o pulmonary sarcoid (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 Also w/ frequent atrial and ventricular ectopy. ? Atrial tach vs Afib. PVCs and NSVT.   Assessment/Plan   New Biventricular Systolic Heart Failure  - Echo: LVEF 25-30%, mod LVH, GIIDD, RV moderately reduced - NICM. LHC w/ normal cors. CI 2.2 on RHC   - Concern for possible cardiac sarcoid, given known pulmonary sarcoid - Plan cMRI today  - If MRI suggestive of sarcoid, will need treatment w/ Prednisone +  methotrexate + Bacterium for PJP prophylaxis  - LifeVest at d/c - Volume status improved w/ IV Lasix  - Continue to optimize GDMT - Stop  Losartan - Add Entresto 49-51 mg bid  - Continue Coreg 3.125 mg bid  - Start SGLT2i, Farxiga 10   2. Atrial Tachycardia vs Atrial Fibrillation  - initially felt to be in Afib w/ RVR on admit, but appears atrial tach on admit EKG - converted to NSR w/ IV amio  - continue Coreg, 3.125 mg bid - Defer a/c to MD  - Keep K > 4.0 and Mg> 2.0   3. T2DM - controlled, Hgb A1c 6.1 - on Metformin, Amaryl and Victoza PTA (all on hold)  - SSI - Start SGLT2i   4. Hypertension  - controlled on current regimen - titrate GDMT per above   5. HLD - controlled w/ statin, LDL 31 - c/w atorvastatin 10   6. Pulmonary Sarcoid -  No pulmonary symptoms - has not required treatment    Length of Stay: 2  Lyda Jester, PA-C  03/26/2021, 8:46 AM  Advanced Heart Failure Team Pager [phone] (M-F; 7a - 5p)  Please contact [facility] for night-coverage after hours (4p -7a ) and weekends on [email or web address]  Patient seen with PA, agree with the above note.   Patient reports exertional dyspnea for several weeks.  She was admitted with CHF exacerbation.  She was noted to have frequent PVCs and possible atrial tachycardia.  Echo showed EF 25-30% with moderate RV dysfunction.  She was diuresed and LHC/RHC was done, showing near-normal filling pressures and no significant CAD.    Of note, patient carries a diagnosis of pulmonary sarcoidosis made by biopsy back in 2002.  She says that she was treated with prednisone for a period of time by her PCP.    Cardiac MRI today showed LV EF 23%, patchy basal septal LGE and subendocardial inferolateral LGE suggestive of cardiac sarcoidosis.   General: NAD Neck: JVP 7-8 cm, no thyromegaly or thyroid nodule.  Lungs: Clear to auscultation bilaterally with normal respiratory effort. CV: Nondisplaced PMI.  Heart regular S1/S2, no S3/S4, no murmur.  No peripheral edema.  No carotid bruit.  Normal pedal pulses.  Abdomen: Soft, nontender, no hepatosplenomegaly, no  distention.  Skin: Intact without lesions or rashes.  Neurologic: Alert and oriented x 3.  Psych: Normal affect. Extremities: No clubbing or cyanosis.  HEENT: Normal.   Assessment/Plan: 1. Acute on chronic systolic CHF: Nonischemic cardiomyopathy.  Cardiac MRI with mild-moderate LV dilation, EF 23%, mild-moderate RV dysfunction EF 38%, Patchy basal septal LGE and subendocardial inferolateral LGE, mildly elevated ECV percentage in the basal septum. In the absence of CAD, this pattern could be seen with cardiac sarcoidosis (or myocarditis).  LHC/RHC showed normal coronaries, CI 2.18 and filling pressures optimized.  Patient has history of biopsy-proven pulmonary sarcoidosis from 2002, treated for a time with steroids.  She has had frequent PVCs since admission.  On exam, she does not appear significantly volume overloaded and breathing is improved.  - Continue Coreg 3.125 mg bid.  - Stop losartan, add Entresto 24/26 bid.  - Add spironolactone 12.5 daily.  - Tomorrow start SGLT2 inhibitor.  - With suspicion for cardiac sarcoidosis, possible syncopal event while driving in the past, frequent PVCs, and LGE on cardiac MRI, I think that it would be safest for her to wear Lifevest with repeat echo in several months after GDMT to assess for need for ICD.  2. Sarcoidosis: Patient had granulomatous inflammation on biopsy of enlarged mediastinal node from 2002.  She was diagnosed with sarcoidosis and treated with prednisone by her PCP.  No recent treatment.  Her clinical course, as above, is worrisome for cardiac sarcoidosis.  Cardiac MRI is consistent with CS.   - I will check ACE level.  - High resolution CT chest to look for pulmonary involvement.  - Given high level of suspicion, I am going to start her on treatment.  Will use [facility] protocol with prednisone 30 mg daily (slow titration down), MTX 10 mg/week to start (slow titration up), and Bactrim for home for PJP prophylaxis.  - Will need cardiac  PET at [facility] to follow therapy.  3. Atrial tachycardia: I have not seen any definite atrial fibrillation, looks like she has had AT runs during this stay.  No anticoagulation needed for now.  4. PVCs: Frequent, ventricular ectopy commonly seen with cardiac sarcoidosis.  - Lifevest.  Loralie Champagne 03/26/2021 6:17 PM

## 2021-03-26 NOTE — Progress Notes (Signed)
LifeVest ordered. [employer] rep notified.   Lyda Jester, PA-C

## 2021-03-26 NOTE — TOC Initial Note (Addendum)
Transition of Care [facility]) - Initial/Assessment Note    Patient Details  Name: [name] MRN: [id number] Date of Birth: 1951-04-24  Transition of Care [facility]) CM/SW Contact:    Erenest Rasher, RN Phone Number: [phone] 03/26/2021, 11:37 AM  Clinical Narrative:                  HF TOC CM spoke to pt at bedside. Has DME in the home. Her dtr, [name] will be at her home to assist post dc. Waiting recommendations for home.   307 pm TOC CM received notification to set up [place] to [employer], [place]. Faxed requested documentation, demo, progress noted, cath results and order to [employer] rep. She will begin insurance auth for [employer] and deliver to pt's room once Josem Kaufmann is received.     Expected Discharge Plan: Home/Self Care Barriers to Discharge: No Barriers Identified   Patient Goals and CMS Choice        Expected Discharge Plan and Services Expected Discharge Plan: Home/Self Care In-house Referral: Clinical Social Work Discharge Planning Services: CM Consult   Living arrangements for the past 2 months: Single Family Home                                      Prior Living Arrangements/Services Living arrangements for the past 2 months: Single Family Home Lives with:: Self Patient language and need for interpreter reviewed:: Yes Do you feel safe going back to the place where you live?: Yes      Need for Family Participation in Patient Care: No (Comment) Care giver support system in place?: No (comment) Current home services: DME (rolling walker, shower chair, wheelchair) Criminal Activity/Legal Involvement Pertinent to Current Situation/Hospitalization: No - Comment as needed  Activities of Daily Living Home Assistive Devices/Equipment: None ADL Screening (condition at time of admission) Patient's cognitive ability adequate to safely complete daily activities?: Yes Is the patient deaf or have difficulty hearing?: Yes Does the patient have  difficulty seeing, even when wearing glasses/contacts?: No Does the patient have difficulty concentrating, remembering, or making decisions?: No Patient able to express need for assistance with ADLs?: Yes Does the patient have difficulty dressing or bathing?: No Independently performs ADLs?: Yes (appropriate for developmental age) Does the patient have difficulty walking or climbing stairs?: Yes Weakness of Legs: Both Weakness of Arms/Hands: None  Permission Sought/Granted Permission sought to share information with : Case Manager, PCP, Family Supports Permission granted to share information with : Yes, Verbal Permission Granted  Share Information with NAME: Latera Mclin     Permission granted to share info w Relationship: daughter  Permission granted to share info w Contact Information: [phone]  Emotional Assessment Appearance:: Appears stated age Attitude/Demeanor/Rapport: Gracious, Engaged Affect (typically observed): Accepting Orientation: : Oriented to Self, Oriented to Place, Oriented to  Time, Oriented to Situation   Psych Involvement: No (comment)  Admission diagnosis:  Acute CHF (congestive heart failure) (HCC) [I50.9] Patient Active Problem List   Diagnosis Date Noted   New onset atrial fibrillation ([place]) 03/24/2021   SOB (shortness of breath) 03/24/2021   Acute CHF (congestive heart failure) ([place]) 27/08/5007   Systolic and diastolic CHF, acute ([place]) 03/24/2021   Injury of breast 01/06/2021   Mass of upper outer quadrant of right breast 01/06/2021   Memory difficulties 01/06/2021   Encounter for screening fecal occult blood testing 11/25/2020  Encounter for well woman exam with routine gynecological exam 11/25/2020   Screening for colorectal cancer 11/20/2019   Encounter for gynecological examination with Papanicolaou smear of cervix 11/20/2019   Routine cervical smear 11/20/2019   Vulvar dystrophy 67/54/4920   Lichen sclerosus et atrophicus 12/02/2016    Encounter for colorectal cancer screening 12/02/2016   Vaginal dryness 04/01/2014   Atypical nevi 04/01/2014   Varicose veins of leg with pain 03/27/2014   PCP:  Asencion Noble, MD Pharmacy:   [place], [place] [address] [place] [address] Phone: [phone] Fax: [phone]     Social Determinants of Health (SDOH) Interventions Food Insecurity Interventions: Intervention Not Indicated Financial Strain Interventions: Intervention Not Indicated Housing Interventions: Intervention Not Indicated Transportation Interventions: Intervention Not Indicated  Readmission Risk Interventions No flowsheet data found.

## 2021-03-26 NOTE — Discharge Summary (Signed)
Advanced Heart Failure Team  Discharge Summary   Patient ID: [name] MRN: [id number], DOB/AGE: [birth year] 72 y.o. Admit date: 03/24/2021 D/C date:     03/27/2021   Primary Discharge Diagnoses:  Acute Systolic Heart Failure/ NICM Cardiac Sarcoidosis NSVT Atrial Tachycardia   Secondary Discharge Diagnoses:  Pulmonary Sarcoidosis  Hypertension  Type 2DM  HLD   Hospital Course:   70 y/o female w/ h/o pulmonary sarcoid (diagnosed >20 years ago and no required treatment), HTN, HLD and Type 2DM but no prior cardiac history, who initially presented to [facility] w/ acute onset dyspnea and found to be in acute CHF and atrial tachycardia, initially concerning for atrial fibrillation. Also noted to have frequent ventricular ectopy w/ PVCs and NSVT. Initial Mg 1.8, K 3.7. TSH mildly elevated at 4.6. T4 normal.    CXR showed cardiomegaly with increased interstitial markings suggestive of mild pulmonary edema. BNP 1119. D-dimer negative. HS trop 34>51>46>37. She was started on IV Lasix, IV amio and heparin. 2D Echo showed biventricular dysfunction. LVEF 25-30% w/ multiple RWMAs (see LV wall scoring below), moderate LVH. RV moderately reduced. Subsequently, she was transferred to [facility] for [facility] and advanced HF consultation.    R/LHC demonstrated normal coronaries and mildly elevated filling pressures with an LVEDP of 17 and a mean wedge of 20.  Her cardiac output is 4.4 L/min with an index of 2.16 L/min/m. There was concern for possible cardiac sarcoid. Underwent cMRI and findings were c/w sarcoid. Started on Immunosuppressive therapy w/ prednisone and methotrexate. Bactrim added for PJP prophylaxis. LifeVest ordered for primary prevention.  According to Dr. Aundra Dubin, the plan is to repeat echocardiogram in several months after goal-directed medical therapy to assess the need for ICD.  Diuresed well w/ IV Lasix and placed on GDMT.    Last seen and examined by Dr. Aundra Dubin on 03/27/2021 and felt  stable for d/c home. Post hospital f/u has been arranged in the [facility].   Discharge medication include: Prednisone 30 mg daily (slow titration down on the next follow-up) Methotrexate 10 mg/week to start (slow titration up) for 2 weeks then 12.5 mg weekly for 2 weeks Bactrim DS 1 tablet every Monday Wednesday Friday for PJP prophylaxis --> unfortunately patient has sulfa allergy listed, I discussed the case with our clinical pharmacist Elita Quick, until the degree of allergy can be clarified, we will switch to PJP prophylaxis choice to  atovaquone 1.5 g once a day  Calcium vitamin D Folic acid 1 mg daily Protonix 40 mg daily Aspirin 81 mg Lipitor 10 mg daily Carvedilol 3.125 mg twice a day Entresto 24-26 mg twice a day Spironolactone 25 mg daily Jardiance 10 mg daily Will need to consider PET at [facility] to follow therapy  Discharge Weight Range:  209 lbs 94.8 kg   Discharge Vitals: Blood pressure (!) 123/97, pulse 88, temperature 97.9 F (36.6 C), temperature source Oral, resp. rate 18, height 5\' 6"  (1.676 m), weight 94.8 kg, SpO2 96 %.  Labs: Lab Results  Component Value Date   WBC 5.7 03/27/2021   HGB 12.4 03/27/2021   HCT 36.2 03/27/2021   MCV 87.4 03/27/2021   PLT 134 (L) 03/27/2021    Recent Labs  Lab 03/27/21 0157  NA 139  K 3.8  CL 110  CO2 23  BUN 9  CREATININE 0.68  CALCIUM 8.4*  PROT 5.9*  BILITOT 1.6*  ALKPHOS 41  ALT 16  AST 22  GLUCOSE 119*   Lab Results  Component  Value Date   CHOL 98 03/25/2021   HDL 32 (L) 03/25/2021   LDLCALC 31 03/25/2021   TRIG 175 (H) 03/25/2021   BNP (last 3 results) Recent Labs    03/24/21 0722  BNP 1,119.0*    ProBNP (last 3 results) No results for input(s): PROBNP in the last 8760 hours.   Diagnostic Studies/Procedures   Echo 03/24/21 Left ventricular ejection fraction, by estimation, is 25 to 30%. The left ventricle has severely decreased function. The left ventricle demonstrates regional wall  motion abnormalities (see scoring diagram/findings for description - diffuse hypokinesis with relative preservation of basal contraction, although not clearly stress-induced cardiomyopathy pattern). The left ventricular internal cavity size was mildly dilated. There is moderate left ventricular hypertrophy. Left ventricular diastolic parameters are consistent with Grade II diastolic dysfunction (pseudonormalization). 1. Right ventricular systolic function is moderately reduced. The right ventricular size is mildly enlarged. There is mildly elevated pulmonary artery systolic pressure. The estimated right ventricular systolic pressure is 44.0 mmHg. 2. 3. Left atrial size was mild to moderately dilated. 4. Right atrial size was mild to moderately dilated. 5. There is a trivial pericardial effusion posterior to the left ventricle. The mitral valve is grossly normal. Mild mitral valve regurgitation. The mean mitral valve gradient is 2.0 mmHg. 6. The aortic valve is tricuspid. Aortic valve regurgitation is not visualized. Mild aortic valve sclerosis is present, with no evidence of aortic valve stenosis. Aortic valve mean gradient measures 4.0 mmHg.   LV Wall Scoring: The mid and distal anterior wall, mid and distal lateral wall, mid and distal anterior septum, entire apex, mid and distal inferior wall, posterior wall, mid anterolateral segment, and mid inferoseptal segment are hypokinetic. The basal anteroseptal segment, basal anterolateral segment, basal anterior segment, basal inferior segment, and basal inferoseptal segment are normal.   R/LHC 03/25/21 Angiogram = normal coronaries Fick CO 4.43 L/min Fick CI 2.16 L/min/m mRAP 4 mPAP 30  LVEDP 17    cMRI 03/26/21 FINDINGS: Small bilateral pleural effusions and associated mild atelectasis.   Trivial pericardial effusion. Mild to moderate left ventricular dilation with mild LV hypertrophy. Diffuse LV hypokinesis worse in the apex  and the inferolateral wall, EF 23%. Normal right ventricular size with mildly decreased systolic function, EF 10%. Mild-moderate left atrial enlargement. Mild right atrial enlargement. No more than mild mitral regurgitation. Trileaflet aortic valve, no stenosis or regurgitation.   Delayed enhancement imaging: Basal septal mid-wall diffuse late gadolinium enhancement (LGE). Basal inferolateral subendocardial LGE, 50% wall thickness.   Measurements   LVEDV 247 mL   LVSV 57 LVEF 23%   RVEDV 160 mL RVSV 57 mL RVEF 38%   T2 54 basal inferolateral wall (borderline elevated), T2 50 in the basal septum (normal range).   On T1 imaging, ECV 37% in the basal septum.   IMPRESSION: 1. Mild to moderate LV dilation with mild LV hypertrophy. EF 23%, diffuse LV hypokinesis worse at the apex and in the inferolateral wall.   2.  Normal RV size with EF 38%.   3. Patchy basal septal LGE and subendocardial inferolateral LGE. Mildly elevated ECV percentage in the basal septum. In the absence of CAD, this pattern could be seen with cardiac sarcoidosis (or myocarditis).   Discharge Medications   Allergies as of 03/27/2021       Reactions   Macrobid [nitrofurantoin Macrocrystal] Nausea And Vomiting   Codeine Nausea And Vomiting   Sulfa Antibiotics Other (See Comments)   Pt cannot remember   Ciprofloxacin    Decreased  BP        Medication List     STOP taking these medications    amLODipine 2.5 MG tablet Commonly known as: NORVASC   chlorthalidone 25 MG tablet Commonly known as: HYGROTON   liraglutide 18 MG/3ML Sopn Commonly known as: VICTOZA   losartan 100 MG tablet Commonly known as: COZAAR   multivitamin with minerals Tabs tablet       TAKE these medications    aspirin EC 81 MG tablet Take 81 mg by mouth at bedtime.   atorvastatin 10 MG tablet Commonly known as: LIPITOR Take 10 mg by mouth at bedtime.   atovaquone 750 MG/5ML suspension Commonly known as:  MEPRON Take 10 mLs (1,500 mg total) by mouth daily.   brimonidine-timolol 0.2-0.5 % ophthalmic solution Commonly known as: COMBIGAN INSTILL ONE DROP IN BOTH  EYES TWO TIMES DAILY   calcium-vitamin D 500-200 MG-UNIT tablet Commonly known as: OSCAL WITH D Take 2 tablets by mouth daily with breakfast. Start taking on: March 28, 2021   carvedilol 3.125 MG tablet Commonly known as: COREG Take 1 tablet (3.125 mg total) by mouth 2 (two) times daily with a meal.   Clobetasol Prop Emollient Base 0.05 % emollient cream Use bid to affected areas for 2 weeks then 3 x a week   dorzolamide 2 % ophthalmic solution Commonly known as: TRUSOPT Place 1 drop into both eyes 2 (two) times daily.   empagliflozin 10 MG Tabs tablet Commonly known as: JARDIANCE Take 1 tablet (10 mg total) by mouth daily.   folic acid 1 MG tablet Commonly known as: FOLVITE Take 1 tablet (1 mg total) by mouth daily. Start taking on: March 28, 2021   gabapentin 300 MG capsule Commonly known as: NEURONTIN Take 300 mg by mouth daily.   glimepiride 1 MG tablet Commonly known as: AMARYL Take 1 mg by mouth daily before breakfast.   latanoprost 0.005 % ophthalmic solution Commonly known as: XALATAN Place 1 drop into both eyes 2 (two) times daily.   metFORMIN 1000 MG tablet Commonly known as: GLUCOPHAGE Take 1,000 mg by mouth 2 (two) times daily.   methotrexate 2.5 MG tablet Caution:Chemotherapy. Protect from light. Take 4 tablets every Friday for 2 weeks, then increase to 5 tablets every Friday for 2 weeks, further med adjustment per MD   pantoprazole 40 MG tablet Commonly known as: PROTONIX Take 1 tablet (40 mg total) by mouth every evening.   predniSONE 10 MG tablet Commonly known as: DELTASONE Take 3 tablets (30 mg total) by mouth daily with breakfast. Start taking on: March 28, 2021   sacubitril-valsartan 24-26 MG Commonly known as: ENTRESTO Take 1 tablet by mouth 2 (two) times daily.    spironolactone 25 MG tablet Commonly known as: ALDACTONE Take 1 tablet (25 mg total) by mouth daily. Start taking on: March 28, 2021               Durable Medical Equipment  (From admission, onward)           Start     Ordered   03/26/21 1356  For home use only DME Vest life vest  Once       Comments: NICM EF < 35%, NSVT Duration 3 months   03/26/21 1356            Disposition   The patient will be discharged in stable condition to home. Discharge Instructions     Diet - low sodium heart healthy   Complete by: As  directed    Increase activity slowly   Complete by: As directed        Follow-up Information     [facility].   Specialty: Cardiology Why: 04/02/21 at 11:30 AM  The [facility] at [facility], [place] [phone] Contact information: [address] [id number] [place] [phone]                  Duration of Discharge Encounter: Greater than 35 minutes   Signed, Almyra Deforest, PA-C  03/27/2021, 12:12 PM

## 2021-03-26 NOTE — Plan of Care (Signed)
  Problem: Cardiovascular: Goal: Ability to achieve and maintain adequate cardiovascular perfusion will improve Outcome: Progressing Goal: Vascular access site(s) Level 0-1 will be maintained Outcome: Progressing   

## 2021-03-27 ENCOUNTER — Encounter: Payer: Self-pay | Admitting: Cardiology

## 2021-03-27 DIAGNOSIS — I5041 Acute combined systolic (congestive) and diastolic (congestive) heart failure: Secondary | ICD-10-CM | POA: Diagnosis not present

## 2021-03-27 LAB — COMPREHENSIVE METABOLIC PANEL
ALT: 16 U/L (ref 0–44)
AST: 22 U/L (ref 15–41)
Albumin: 3.5 g/dL (ref 3.5–5.0)
Alkaline Phosphatase: 41 U/L (ref 38–126)
Anion gap: 6 (ref 5–15)
BUN: 9 mg/dL (ref 8–23)
CO2: 23 mmol/L (ref 22–32)
Calcium: 8.4 mg/dL — ABNORMAL LOW (ref 8.9–10.3)
Chloride: 110 mmol/L (ref 98–111)
Creatinine, Ser: 0.68 mg/dL (ref 0.44–1.00)
GFR, Estimated: >60 mL/min (ref >60)
Glucose, Bld: 119 mg/dL — ABNORMAL HIGH (ref 70–99)
Potassium: 3.8 mmol/L (ref 3.5–5.1)
Sodium: 139 mmol/L (ref 135–145)
Total Bilirubin: 1.6 mg/dL — ABNORMAL HIGH (ref 0.3–1.2)
Total Protein: 5.9 g/dL — ABNORMAL LOW (ref 6.5–8.1)

## 2021-03-27 LAB — CBC
HCT: 36.2 % (ref 36.0–46.0)
Hemoglobin: 12.4 g/dL (ref 12.0–15.0)
MCH: 30 pg (ref 26.0–34.0)
MCHC: 34.3 g/dL (ref 30.0–36.0)
MCV: 87.4 fL (ref 80.0–100.0)
Platelets: 134 10*3/uL — ABNORMAL LOW (ref 150–400)
RBC: 4.14 MIL/uL (ref 3.87–5.11)
RDW: 13.4 % (ref 11.5–15.5)
WBC: 5.7 10*3/uL (ref 4.0–10.5)
nRBC: 0 % (ref 0.0–0.2)

## 2021-03-27 LAB — MAGNESIUM: Magnesium: 2 mg/dL (ref 1.7–2.4)

## 2021-03-27 LAB — GLUCOSE, CAPILLARY: Glucose-Capillary: 236 mg/dL — ABNORMAL HIGH (ref 70–99)

## 2021-03-27 MED ORDER — ATOVAQUONE 750 MG/5ML PO SUSP: 1500.0000 mg | Freq: Every day | ORAL | 0 refills | Status: DC

## 2021-03-27 MED ORDER — METHOTREXATE SODIUM 2.5 MG PO TABS: ORAL_TABLET | ORAL | 1 refills | Status: DC

## 2021-03-27 MED ORDER — DAPAGLIFLOZIN PROPANEDIOL 10 MG PO TABS: 10.0000 mg | ORAL_TABLET | Freq: Every day | ORAL | Status: DC

## 2021-03-27 MED ORDER — SPIRONOLACTONE 12.5 MG HALF TABLET
12.5000 mg | ORAL_TABLET | Freq: Once | ORAL | Status: AC
  Administered 2021-03-27: 12.5 mg via ORAL
  Filled 2021-03-27: qty 1

## 2021-03-27 MED ORDER — CARVEDILOL 3.125 MG PO TABS: 3.1250 mg | ORAL_TABLET | Freq: Two times a day (BID) | ORAL | 2 refills | Status: DC

## 2021-03-27 MED ORDER — SPIRONOLACTONE 25 MG PO TABS: 25.0000 mg | ORAL_TABLET | Freq: Every day | ORAL | 2 refills | Status: DC

## 2021-03-27 MED ORDER — EMPAGLIFLOZIN 10 MG PO TABS
10.0000 mg | ORAL_TABLET | Freq: Every day | ORAL | Status: DC
  Administered 2021-03-27: 10 mg via ORAL
  Filled 2021-03-27: qty 1

## 2021-03-27 MED ORDER — SACUBITRIL-VALSARTAN 24-26 MG PO TABS: 1.0000 | ORAL_TABLET | Freq: Two times a day (BID) | ORAL | 2 refills | Status: DC

## 2021-03-27 MED ORDER — EMPAGLIFLOZIN 10 MG PO TABS: 10.0000 mg | ORAL_TABLET | Freq: Every day | ORAL | 2 refills | Status: DC

## 2021-03-27 MED ORDER — PANTOPRAZOLE SODIUM 40 MG PO TBEC
40.0000 mg | DELAYED_RELEASE_TABLET | Freq: Every evening | ORAL | 1 refills | Status: DC
Start: 2021-03-27 — End: 2021-06-14

## 2021-03-27 MED ORDER — PREDNISONE 10 MG PO TABS: 30.0000 mg | ORAL_TABLET | Freq: Every day | ORAL | 0 refills | Status: DC

## 2021-03-27 MED ORDER — POTASSIUM CHLORIDE CRYS ER 20 MEQ PO TBCR
20.0000 meq | EXTENDED_RELEASE_TABLET | Freq: Once | ORAL | Status: AC
  Administered 2021-03-27: 20 meq via ORAL
  Filled 2021-03-27: qty 1

## 2021-03-27 MED ORDER — CALCIUM CARBONATE-VITAMIN D 500-200 MG-UNIT PO TABS: 2.0000 | ORAL_TABLET | Freq: Every day | ORAL | 2 refills | Status: AC

## 2021-03-27 MED ORDER — SPIRONOLACTONE 25 MG PO TABS: 25.0000 mg | ORAL_TABLET | Freq: Every day | ORAL | Status: DC

## 2021-03-27 MED ORDER — FOLIC ACID 1 MG PO TABS: 1.0000 mg | ORAL_TABLET | Freq: Every day | ORAL | 1 refills | Status: DC

## 2021-03-27 NOTE — Plan of Care (Signed)
  Problem: Education: Goal: Knowledge of General Education information will improve Description: Including pain rating scale, medication(s)/side effects and non-pharmacologic comfort measures Outcome: Adequate for Discharge   Problem: Health Behavior/Discharge Planning: Goal: Ability to manage health-related needs will improve Outcome: Adequate for Discharge   Problem: Clinical Measurements: Goal: Ability to maintain clinical measurements within normal limits will improve Outcome: Adequate for Discharge Goal: Will remain free from infection Outcome: Adequate for Discharge Goal: Diagnostic test results will improve Outcome: Adequate for Discharge Goal: Respiratory complications will improve Outcome: Adequate for Discharge Goal: Cardiovascular complication will be avoided Outcome: Adequate for Discharge   Problem: Activity: Goal: Risk for activity intolerance will decrease Outcome: Adequate for Discharge   Problem: Nutrition: Goal: Adequate nutrition will be maintained Outcome: Adequate for Discharge   Problem: Coping: Goal: Level of anxiety will decrease Outcome: Adequate for Discharge   Problem: Elimination: Goal: Will not experience complications related to bowel motility Outcome: Adequate for Discharge Goal: Will not experience complications related to urinary retention Outcome: Adequate for Discharge   Problem: Pain Managment: Goal: General experience of comfort will improve Outcome: Adequate for Discharge   Problem: Safety: Goal: Ability to remain free from injury will improve Outcome: Adequate for Discharge   Problem: Skin Integrity: Goal: Risk for impaired skin integrity will decrease Outcome: Adequate for Discharge   Problem: Cardiovascular: Goal: Ability to achieve and maintain adequate cardiovascular perfusion will improve Outcome: Adequate for Discharge Goal: Vascular access site(s) Level 0-1 will be maintained Outcome: Adequate for Discharge
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
## 2021-03-29 LAB — ANGIOTENSIN CONVERTING ENZYME: Angiotensin-Converting Enzyme: 14 U/L (ref 14–82)

## 2021-04-02 ENCOUNTER — Ambulatory Visit (HOSPITAL_COMMUNITY)
Admit: 2021-04-02 | Discharge: 2021-04-02 | Disposition: A | Discharge status: Home / Self Care | Payer: Medicare PPO | Source: Ambulatory Visit | Attending: Physician Assistant | Admitting: Physician Assistant

## 2021-04-02 ENCOUNTER — Encounter (HOSPITAL_COMMUNITY): Payer: Self-pay

## 2021-04-02 ENCOUNTER — Other Ambulatory Visit: Payer: Self-pay

## 2021-04-02 VITALS — BP 122/84 | HR 67 | Ht 66.0 in | Wt 202.6 lb

## 2021-04-02 DIAGNOSIS — Z7984 Long term (current) use of oral hypoglycemic drugs: Secondary | ICD-10-CM | POA: Insufficient documentation

## 2021-04-02 DIAGNOSIS — D869 Sarcoidosis, unspecified: Secondary | ICD-10-CM | POA: Insufficient documentation

## 2021-04-02 DIAGNOSIS — Z7901 Long term (current) use of anticoagulants: Secondary | ICD-10-CM | POA: Insufficient documentation

## 2021-04-02 DIAGNOSIS — I447 Left bundle-branch block, unspecified: Secondary | ICD-10-CM | POA: Insufficient documentation

## 2021-04-02 DIAGNOSIS — I472 Ventricular tachycardia, unspecified: Secondary | ICD-10-CM | POA: Insufficient documentation

## 2021-04-02 DIAGNOSIS — I5041 Acute combined systolic (congestive) and diastolic (congestive) heart failure: Secondary | ICD-10-CM

## 2021-04-02 DIAGNOSIS — I493 Ventricular premature depolarization: Secondary | ICD-10-CM | POA: Diagnosis not present

## 2021-04-02 DIAGNOSIS — R42 Dizziness and giddiness: Secondary | ICD-10-CM | POA: Insufficient documentation

## 2021-04-02 DIAGNOSIS — Z7952 Long term (current) use of systemic steroids: Secondary | ICD-10-CM | POA: Diagnosis not present

## 2021-04-02 DIAGNOSIS — Z79899 Other long term (current) drug therapy: Secondary | ICD-10-CM | POA: Diagnosis not present

## 2021-04-02 DIAGNOSIS — Z8249 Family history of ischemic heart disease and other diseases of the circulatory system: Secondary | ICD-10-CM | POA: Diagnosis not present

## 2021-04-02 DIAGNOSIS — I471 Supraventricular tachycardia: Secondary | ICD-10-CM | POA: Insufficient documentation

## 2021-04-02 DIAGNOSIS — D8685 Sarcoid myocarditis: Secondary | ICD-10-CM

## 2021-04-02 DIAGNOSIS — R590 Localized enlarged lymph nodes: Secondary | ICD-10-CM | POA: Insufficient documentation

## 2021-04-02 DIAGNOSIS — I110 Hypertensive heart disease with heart failure: Secondary | ICD-10-CM | POA: Insufficient documentation

## 2021-04-02 DIAGNOSIS — Z7982 Long term (current) use of aspirin: Secondary | ICD-10-CM | POA: Insufficient documentation

## 2021-04-02 DIAGNOSIS — I428 Other cardiomyopathies: Secondary | ICD-10-CM | POA: Diagnosis not present

## 2021-04-02 LAB — COMPREHENSIVE METABOLIC PANEL
ALT: 32 U/L (ref 0–44)
AST: 27 U/L (ref 15–41)
Albumin: 4.3 g/dL (ref 3.5–5.0)
Alkaline Phosphatase: 43 U/L (ref 38–126)
Anion gap: 11 (ref 5–15)
BUN: 27 mg/dL — ABNORMAL HIGH (ref 8–23)
CO2: 19 mmol/L — ABNORMAL LOW (ref 22–32)
Calcium: 9.4 mg/dL (ref 8.9–10.3)
Chloride: 105 mmol/L (ref 98–111)
Creatinine, Ser: 1.05 mg/dL — ABNORMAL HIGH (ref 0.44–1.00)
GFR, Estimated: 57 mL/min — ABNORMAL LOW (ref >60)
Glucose, Bld: 157 mg/dL — ABNORMAL HIGH (ref 70–99)
Potassium: 4.3 mmol/L (ref 3.5–5.1)
Sodium: 135 mmol/L (ref 135–145)
Total Bilirubin: 1.6 mg/dL — ABNORMAL HIGH (ref 0.3–1.2)
Total Protein: 7.1 g/dL (ref 6.5–8.1)

## 2021-04-02 LAB — CBC
HCT: 41.8 % (ref 36.0–46.0)
Hemoglobin: 14.3 g/dL (ref 12.0–15.0)
MCH: 30 pg (ref 26.0–34.0)
MCHC: 34.2 g/dL (ref 30.0–36.0)
MCV: 87.8 fL (ref 80.0–100.0)
Platelets: 198 10*3/uL (ref 150–400)
RBC: 4.76 MIL/uL (ref 3.87–5.11)
RDW: 13.9 % (ref 11.5–15.5)
WBC: 9 10*3/uL (ref 4.0–10.5)
nRBC: 0 % (ref 0.0–0.2)
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
## 2021-04-02 NOTE — Patient Instructions (Signed)
It was great to see you today! No medication changes are needed at this time.  Labs today We will only contact you if something comes back abnormal or we need to make some changes. Otherwise no news is good news!  You have been referred to [facility] -they will be in touch with an appointment    Your physician recommends that you schedule a follow-up appointment in: 2-3 weeks with Dr Aundra Dubin  Do the following things EVERYDAY: Weigh yourself in the morning before breakfast. Write it down and keep it in a log. Take your medicines as prescribed Eat low salt foods--Limit salt (sodium) to 2000 mg per day.  Stay as active as you can everyday Limit all fluids for the day to less than 2 liters  At the [facility], you and your health needs are our priority. As part of our continuing mission to provide you with exceptional heart care, we have created designated Provider Care Teams. These Care Teams include your primary Cardiologist (physician) and Advanced Practice Providers (APPs- Physician Assistants and Nurse Practitioners) who all work together to provide you with the care you need, when you need it.   You may see any of the following providers on your designated Care Team at your next follow up: Dr Glori Bickers Dr Loralie Champagne Dr Patrice Paradise, NP Lyda Jester, [place] Ginnie Smart Audry Riles, PharmD   Please be sure to bring in all your medications bottles to every appointment.

## 2021-04-04 DIAGNOSIS — K219 Gastro-esophageal reflux disease without esophagitis: Secondary | ICD-10-CM | POA: Diagnosis not present

## 2021-04-04 DIAGNOSIS — H409 Unspecified glaucoma: Secondary | ICD-10-CM | POA: Diagnosis not present

## 2021-04-04 DIAGNOSIS — Z7952 Long term (current) use of systemic steroids: Secondary | ICD-10-CM | POA: Diagnosis not present

## 2021-04-04 DIAGNOSIS — I10 Essential (primary) hypertension: Secondary | ICD-10-CM | POA: Diagnosis not present

## 2021-04-04 DIAGNOSIS — D869 Sarcoidosis, unspecified: Secondary | ICD-10-CM | POA: Diagnosis not present

## 2021-04-04 DIAGNOSIS — E669 Obesity, unspecified: Secondary | ICD-10-CM | POA: Diagnosis not present

## 2021-04-04 DIAGNOSIS — E785 Hyperlipidemia, unspecified: Secondary | ICD-10-CM | POA: Diagnosis not present

## 2021-04-04 DIAGNOSIS — E1142 Type 2 diabetes mellitus with diabetic polyneuropathy: Secondary | ICD-10-CM | POA: Diagnosis not present

## 2021-04-04 DIAGNOSIS — Z7982 Long term (current) use of aspirin: Secondary | ICD-10-CM | POA: Diagnosis not present

## 2021-04-06 DIAGNOSIS — D8685 Sarcoid myocarditis: Secondary | ICD-10-CM | POA: Diagnosis not present

## 2021-04-06 DIAGNOSIS — I5021 Acute systolic (congestive) heart failure: Secondary | ICD-10-CM | POA: Diagnosis not present

## 2021-04-06 DIAGNOSIS — E1122 Type 2 diabetes mellitus with diabetic chronic kidney disease: Secondary | ICD-10-CM | POA: Diagnosis not present

## 2021-04-13 ENCOUNTER — Telehealth (HOSPITAL_COMMUNITY): Payer: Self-pay | Admitting: *Deleted

## 2021-04-13 NOTE — Telephone Encounter (Signed)
No, does not need 24hr care at this point.  Make sure she has her followup appointment with me to manage MTX and prednisone.

## 2021-04-13 NOTE — Telephone Encounter (Signed)
Pts daughter called and had a question for Dr.McLean. She asked does the patient still need someone to be with her 24hours a day. Pt is wearing lifevest and her dizziness is a lot better. Pts family has been taking turns staying overnight and throughout the day with her. Pt is able to ambulate and take care of herself as far as bathing and daily activities. She asked is someone asked to stay with her 24/7 because of the [place].  Routed to [place] for advice

## 2021-04-14 ENCOUNTER — Other Ambulatory Visit (HOSPITAL_COMMUNITY): Payer: Self-pay | Admitting: *Deleted

## 2021-04-14 MED ORDER — PREDNISONE 10 MG PO TABS: 30.0000 mg | ORAL_TABLET | Freq: Every day | ORAL | 0 refills | Status: DC

## 2021-04-14 NOTE — Telephone Encounter (Signed)
Daughter aware. Per Dr.McLean continue prednisone 30mg  daily. Pt has scheduled office visit Tuesday 10/25

## 2021-04-20 ENCOUNTER — Ambulatory Visit (HOSPITAL_COMMUNITY)
Admission: RE | Admit: 2021-04-20 | Discharge: 2021-04-20 | Disposition: A | Discharge status: Home / Self Care | Payer: Medicare PPO | Source: Ambulatory Visit | Attending: Cardiology | Admitting: Cardiology

## 2021-04-20 ENCOUNTER — Encounter (HOSPITAL_COMMUNITY): Payer: Self-pay | Admitting: Cardiology

## 2021-04-20 ENCOUNTER — Other Ambulatory Visit: Payer: Self-pay

## 2021-04-20 VITALS — BP 130/80 | HR 61 | Wt 196.0 lb

## 2021-04-20 DIAGNOSIS — Z7982 Long term (current) use of aspirin: Secondary | ICD-10-CM | POA: Insufficient documentation

## 2021-04-20 DIAGNOSIS — D8685 Sarcoid myocarditis: Secondary | ICD-10-CM

## 2021-04-20 DIAGNOSIS — Z7984 Long term (current) use of oral hypoglycemic drugs: Secondary | ICD-10-CM | POA: Diagnosis not present

## 2021-04-20 DIAGNOSIS — I5041 Acute combined systolic (congestive) and diastolic (congestive) heart failure: Secondary | ICD-10-CM | POA: Diagnosis not present

## 2021-04-20 DIAGNOSIS — I447 Left bundle-branch block, unspecified: Secondary | ICD-10-CM | POA: Diagnosis not present

## 2021-04-20 DIAGNOSIS — I471 Supraventricular tachycardia: Secondary | ICD-10-CM | POA: Diagnosis not present

## 2021-04-20 DIAGNOSIS — I5022 Chronic systolic (congestive) heart failure: Secondary | ICD-10-CM

## 2021-04-20 DIAGNOSIS — E119 Type 2 diabetes mellitus without complications: Secondary | ICD-10-CM | POA: Diagnosis not present

## 2021-04-20 DIAGNOSIS — D869 Sarcoidosis, unspecified: Secondary | ICD-10-CM | POA: Insufficient documentation

## 2021-04-20 DIAGNOSIS — I110 Hypertensive heart disease with heart failure: Secondary | ICD-10-CM | POA: Insufficient documentation

## 2021-04-20 DIAGNOSIS — Z7960 Long term (current) use of unspecified immunomodulators and immunosuppressants: Secondary | ICD-10-CM | POA: Diagnosis not present

## 2021-04-20 DIAGNOSIS — I472 Ventricular tachycardia, unspecified: Secondary | ICD-10-CM | POA: Insufficient documentation

## 2021-04-20 DIAGNOSIS — Z79899 Other long term (current) drug therapy: Secondary | ICD-10-CM | POA: Insufficient documentation

## 2021-04-20 DIAGNOSIS — E785 Hyperlipidemia, unspecified: Secondary | ICD-10-CM | POA: Insufficient documentation

## 2021-04-20 DIAGNOSIS — Z8249 Family history of ischemic heart disease and other diseases of the circulatory system: Secondary | ICD-10-CM | POA: Diagnosis not present

## 2021-04-20 DIAGNOSIS — I428 Other cardiomyopathies: Secondary | ICD-10-CM

## 2021-04-20 DIAGNOSIS — Z7952 Long term (current) use of systemic steroids: Secondary | ICD-10-CM | POA: Insufficient documentation

## 2021-04-20 LAB — BASIC METABOLIC PANEL
Anion gap: 8 (ref 5–15)
BUN: 18 mg/dL (ref 8–23)
CO2: 24 mmol/L (ref 22–32)
Calcium: 9.2 mg/dL (ref 8.9–10.3)
Chloride: 104 mmol/L (ref 98–111)
Creatinine, Ser: 1.16 mg/dL — ABNORMAL HIGH (ref 0.44–1.00)
GFR, Estimated: 50 mL/min — ABNORMAL LOW (ref >60)
Glucose, Bld: 119 mg/dL — ABNORMAL HIGH (ref 70–99)
Potassium: 4.4 mmol/L (ref 3.5–5.1)
Sodium: 136 mmol/L (ref 135–145)

## 2021-04-20 LAB — TSH: TSH: 1.342 u[IU]/mL (ref 0.350–4.500)

## 2021-04-20 MED ORDER — PREDNISONE 10 MG PO TABS: ORAL_TABLET | ORAL | 1 refills | Status: DC

## 2021-04-20 MED ORDER — SPIRONOLACTONE 25 MG PO TABS: 25.0000 mg | ORAL_TABLET | Freq: Every day | ORAL | 3 refills | Status: DC

## 2021-04-20 MED ORDER — METHOTREXATE SODIUM 2.5 MG PO TABS: ORAL_TABLET | ORAL | 1 refills | Status: DC

## 2021-04-20 MED ORDER — ATOVAQUONE 750 MG/5ML PO SUSP: 1500.0000 mg | Freq: Every day | ORAL | 3 refills | Status: DC

## 2021-04-20 MED ORDER — EMPAGLIFLOZIN 10 MG PO TABS: 10.0000 mg | ORAL_TABLET | Freq: Every day | ORAL | 3 refills | Status: DC

## 2021-04-20 MED ORDER — CARVEDILOL 6.25 MG PO TABS: 6.2500 mg | ORAL_TABLET | Freq: Two times a day (BID) | ORAL | 3 refills | Status: DC

## 2021-04-20 NOTE — Patient Instructions (Addendum)
Labs done today. We will contact you only if your labs are abnormal.  INCREASE Carvedilol to 6.25mg  (1 tablet) by mouth 2 times daily.   Start taking Spironolactone and Jardiance daily at bedtime.  RESTART Atovaquone Take 10 mLs (1,500 mg total) by mouth daily.  RESTART Methotrexate Take 5 tablets this Friday and then 6 tablets every Friday for 2 weeks  RESTART Prednisone Take 2 & 1/2 tablets by mouth daily for 1 month then decrease to 2 tablets by mouth daily  No other medication changes were made. Please continue all current medications as prescribed.  Your physician recommends that you schedule a follow-up appointment in: soon with Pharmacy and in 6 weeks with Dr. Aundra Dubin.  If you have any questions or concerns before your next appointment please send [place] a message through [place] or call our office at [phone].    TO LEAVE A MESSAGE FOR THE NURSE SELECT OPTION 2, PLEASE LEAVE A MESSAGE INCLUDING: YOUR NAME DATE OF BIRTH CALL BACK NUMBER REASON FOR CALL**this is important as we prioritize the call backs  YOU WILL RECEIVE A CALL BACK THE SAME DAY AS LONG AS YOU CALL BEFORE 4:00 PM   Do the following things EVERYDAY: Weigh yourself in the morning before breakfast. Write it down and keep it in a log. Take your medicines as prescribed Eat low salt foods--Limit salt (sodium) to 2000 mg per day.  Stay as active as you can everyday Limit all fluids for the day to less than 2 liters   At the [facility], you and your health needs are our priority. As part of our continuing mission to provide you with exceptional heart care, we have created designated Provider Care Teams. These Care Teams include your primary Cardiologist (physician) and Advanced Practice Providers (APPs- Physician Assistants and Nurse Practitioners) who all work together to provide you with the care you need, when you need it.   You may see any of the following providers on your designated Care  Team at your next follow up: Dr Glori Bickers Dr Haynes Kerns, NP Lyda Jester, [place] Audry Riles, PharmD   Please be sure to bring in all your medications bottles to every appointment.
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
## 2021-04-21 ENCOUNTER — Encounter (HOSPITAL_COMMUNITY): Payer: Self-pay

## 2021-04-27 DIAGNOSIS — D869 Sarcoidosis, unspecified: Secondary | ICD-10-CM | POA: Diagnosis not present

## 2021-04-30 DIAGNOSIS — H401133 Primary open-angle glaucoma, bilateral, severe stage: Secondary | ICD-10-CM | POA: Diagnosis not present

## 2021-05-10 DIAGNOSIS — H401133 Primary open-angle glaucoma, bilateral, severe stage: Secondary | ICD-10-CM | POA: Diagnosis not present
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
## 2021-05-11 ENCOUNTER — Ambulatory Visit (HOSPITAL_COMMUNITY)
Admission: RE | Admit: 2021-05-11 | Discharge: 2021-05-11 | Disposition: A | Discharge status: Home / Self Care | Payer: Medicare PPO | Source: Ambulatory Visit | Attending: Family Medicine | Admitting: Family Medicine

## 2021-05-11 VITALS — BP 122/64 | HR 68 | Wt 196.8 lb

## 2021-05-11 DIAGNOSIS — D8689 Sarcoidosis of other sites: Secondary | ICD-10-CM | POA: Diagnosis not present

## 2021-05-11 DIAGNOSIS — I428 Other cardiomyopathies: Secondary | ICD-10-CM | POA: Diagnosis not present

## 2021-05-11 DIAGNOSIS — E785 Hyperlipidemia, unspecified: Secondary | ICD-10-CM | POA: Insufficient documentation

## 2021-05-11 DIAGNOSIS — I5022 Chronic systolic (congestive) heart failure: Secondary | ICD-10-CM | POA: Diagnosis not present

## 2021-05-11 DIAGNOSIS — I110 Hypertensive heart disease with heart failure: Secondary | ICD-10-CM | POA: Diagnosis not present

## 2021-05-11 DIAGNOSIS — I471 Supraventricular tachycardia: Secondary | ICD-10-CM | POA: Diagnosis not present

## 2021-05-11 DIAGNOSIS — Z7984 Long term (current) use of oral hypoglycemic drugs: Secondary | ICD-10-CM | POA: Insufficient documentation

## 2021-05-11 DIAGNOSIS — Z7952 Long term (current) use of systemic steroids: Secondary | ICD-10-CM | POA: Insufficient documentation

## 2021-05-11 DIAGNOSIS — I5041 Acute combined systolic (congestive) and diastolic (congestive) heart failure: Secondary | ICD-10-CM

## 2021-05-11 DIAGNOSIS — Z79899 Other long term (current) drug therapy: Secondary | ICD-10-CM | POA: Diagnosis not present

## 2021-05-11 DIAGNOSIS — E119 Type 2 diabetes mellitus without complications: Secondary | ICD-10-CM | POA: Diagnosis not present

## 2021-05-11 DIAGNOSIS — I493 Ventricular premature depolarization: Secondary | ICD-10-CM | POA: Insufficient documentation

## 2021-05-11 LAB — COMPREHENSIVE METABOLIC PANEL
ALT: 23 U/L (ref 0–44)
AST: 19 U/L (ref 15–41)
Albumin: 3.9 g/dL (ref 3.5–5.0)
Alkaline Phosphatase: 36 U/L — ABNORMAL LOW (ref 38–126)
Anion gap: 10 (ref 5–15)
BUN: 15 mg/dL (ref 8–23)
CO2: 25 mmol/L (ref 22–32)
Calcium: 9.2 mg/dL (ref 8.9–10.3)
Chloride: 101 mmol/L (ref 98–111)
Creatinine, Ser: 0.91 mg/dL (ref 0.44–1.00)
GFR, Estimated: >60 mL/min (ref >60)
Glucose, Bld: 237 mg/dL — ABNORMAL HIGH (ref 70–99)
Potassium: 4.5 mmol/L (ref 3.5–5.1)
Sodium: 136 mmol/L (ref 135–145)
Total Bilirubin: 1.8 mg/dL — ABNORMAL HIGH (ref 0.3–1.2)
Total Protein: 6.4 g/dL — ABNORMAL LOW (ref 6.5–8.1)

## 2021-05-11 LAB — CBC WITH DIFFERENTIAL/PLATELET
Abs Immature Granulocytes: 0.03 10*3/uL (ref 0.00–0.07)
Basophils Absolute: 0 10*3/uL (ref 0.0–0.1)
Basophils Relative: 0 %
Eosinophils Absolute: 0 10*3/uL (ref 0.0–0.5)
Eosinophils Relative: 0 %
HCT: 39.7 % (ref 36.0–46.0)
Hemoglobin: 13.3 g/dL (ref 12.0–15.0)
Immature Granulocytes: 0 %
Lymphocytes Relative: 11 %
Lymphs Abs: 1 10*3/uL (ref 0.7–4.0)
MCH: 31.5 pg (ref 26.0–34.0)
MCHC: 33.5 g/dL (ref 30.0–36.0)
MCV: 94.1 fL (ref 80.0–100.0)
Monocytes Absolute: 0.3 10*3/uL (ref 0.1–1.0)
Monocytes Relative: 3 %
Neutro Abs: 7.6 10*3/uL (ref 1.7–7.7)
Neutrophils Relative %: 86 %
Platelets: 152 10*3/uL (ref 150–400)
RBC: 4.22 MIL/uL (ref 3.87–5.11)
RDW: 15.3 % (ref 11.5–15.5)
WBC: 8.9 10*3/uL (ref 4.0–10.5)
nRBC: 0 % (ref 0.0–0.2)

## 2021-05-11 MED ORDER — PREDNISONE 10 MG PO TABS: ORAL_TABLET | ORAL | 1 refills | Status: DC

## 2021-05-11 MED ORDER — METHOTREXATE SODIUM 2.5 MG PO TABS: ORAL_TABLET | ORAL | 1 refills | Status: DC

## 2021-05-11 NOTE — Patient Instructions (Addendum)
It was a pleasure seeing you today!  MEDICATIONS: -We are changing your medications today -Increase methotrexate to 17.5 mg (7 tablets) once weekly for 2 weeks (starting November 18th, 2022), then increase to 20 mg (8 tablets) once weekly starting December 2nd, 2022. Continue Methotrexate 20 mg once weekly ongoing.  -Continue prednisone 25 mg (2.5 tablets) once daily until May 18, 2021, then decrease to prednisone 20 mg (2 tablets) daily for 4 weeks.  -Call if you have questions about your medications.  LABS: -We will call you if your labs need attention.  NEXT APPOINTMENT: Return to clinic on 05/28/21 with Dr. Aundra Dubin.  In general, to take care of your heart failure: -Limit your fluid intake to 2 Liters (half-gallon) per day.   -Limit your salt intake to ideally 2-3 grams (2000-3000 mg) per day. -Weigh yourself daily and record, and bring that "weight diary" to your next appointment.  (Weight gain of 2-3 pounds in 1 day typically means fluid weight.) -The medications for your heart are to help your heart and help you live longer.   -Please contact us before stopping any of your heart medications.  Call the clinic at [phone] with questions or to reschedule future appointments.

## 2021-05-18 ENCOUNTER — Encounter (HOSPITAL_COMMUNITY): Payer: Self-pay | Admitting: Cardiology

## 2021-05-24 ENCOUNTER — Other Ambulatory Visit: Payer: Self-pay | Admitting: Physician Assistant

## 2021-05-26 NOTE — Telephone Encounter (Signed)
This is a CHF pt 

## 2021-05-27 DIAGNOSIS — D869 Sarcoidosis, unspecified: Secondary | ICD-10-CM | POA: Diagnosis not present

## 2021-05-28 ENCOUNTER — Ambulatory Visit (HOSPITAL_COMMUNITY)
Admission: RE | Admit: 2021-05-28 | Discharge: 2021-05-28 | Disposition: A | Discharge status: Home / Self Care | Payer: Medicare PPO | Source: Ambulatory Visit | Attending: Cardiology | Admitting: Cardiology

## 2021-05-28 VITALS — BP 130/76 | HR 67 | Wt 194.0 lb

## 2021-05-28 DIAGNOSIS — I471 Supraventricular tachycardia: Secondary | ICD-10-CM | POA: Insufficient documentation

## 2021-05-28 DIAGNOSIS — I472 Ventricular tachycardia, unspecified: Secondary | ICD-10-CM | POA: Insufficient documentation

## 2021-05-28 DIAGNOSIS — Z7984 Long term (current) use of oral hypoglycemic drugs: Secondary | ICD-10-CM | POA: Diagnosis not present

## 2021-05-28 DIAGNOSIS — E785 Hyperlipidemia, unspecified: Secondary | ICD-10-CM | POA: Diagnosis not present

## 2021-05-28 DIAGNOSIS — I428 Other cardiomyopathies: Secondary | ICD-10-CM | POA: Insufficient documentation

## 2021-05-28 DIAGNOSIS — I5022 Chronic systolic (congestive) heart failure: Secondary | ICD-10-CM | POA: Diagnosis not present

## 2021-05-28 DIAGNOSIS — I110 Hypertensive heart disease with heart failure: Secondary | ICD-10-CM | POA: Diagnosis not present

## 2021-05-28 DIAGNOSIS — E119 Type 2 diabetes mellitus without complications: Secondary | ICD-10-CM | POA: Diagnosis not present

## 2021-05-28 DIAGNOSIS — Z7901 Long term (current) use of anticoagulants: Secondary | ICD-10-CM | POA: Insufficient documentation

## 2021-05-28 DIAGNOSIS — Z79899 Other long term (current) drug therapy: Secondary | ICD-10-CM | POA: Insufficient documentation

## 2021-05-28 DIAGNOSIS — I447 Left bundle-branch block, unspecified: Secondary | ICD-10-CM | POA: Insufficient documentation

## 2021-05-28 DIAGNOSIS — R590 Localized enlarged lymph nodes: Secondary | ICD-10-CM | POA: Diagnosis not present

## 2021-05-28 LAB — COMPREHENSIVE METABOLIC PANEL
ALT: 21 U/L (ref 0–44)
AST: 24 U/L (ref 15–41)
Albumin: 3.8 g/dL (ref 3.5–5.0)
Alkaline Phosphatase: 40 U/L (ref 38–126)
Anion gap: 11 (ref 5–15)
BUN: 13 mg/dL (ref 8–23)
CO2: 22 mmol/L (ref 22–32)
Calcium: 8.8 mg/dL — ABNORMAL LOW (ref 8.9–10.3)
Chloride: 102 mmol/L (ref 98–111)
Creatinine, Ser: 0.96 mg/dL (ref 0.44–1.00)
GFR, Estimated: >60 mL/min (ref >60)
Glucose, Bld: 279 mg/dL — ABNORMAL HIGH (ref 70–99)
Potassium: 4.8 mmol/L (ref 3.5–5.1)
Sodium: 135 mmol/L (ref 135–145)
Total Bilirubin: 1.4 mg/dL — ABNORMAL HIGH (ref 0.3–1.2)
Total Protein: 6 g/dL — ABNORMAL LOW (ref 6.5–8.1)

## 2021-05-28 LAB — MAGNESIUM: Magnesium: 2.2 mg/dL (ref 1.7–2.4)

## 2021-05-28 MED ORDER — ENTRESTO 49-51 MG PO TABS: 1.0000 | ORAL_TABLET | Freq: Two times a day (BID) | ORAL | 11 refills | Status: DC

## 2021-05-28 MED ORDER — MAGNESIUM 400 MG PO TABS: 400.0000 mg | ORAL_TABLET | Freq: Every day | ORAL | 3 refills | Status: AC

## 2021-05-28 NOTE — Patient Instructions (Addendum)
Labs done today. We will contact you only if your labs are abnormal.  INCREASE Entresto to 49-51mg  (1 tablet) by mouth 2 times daily.   START Magnesium 400mg  (1 tablet) by mouth daily.   No other medication changes were made. Please continue all current medications as prescribed.  We will contact you to at a later date regarding your PET Scan at [facility].   Your physician recommends that you schedule a follow-up appointment in: 10 days for a lab only appointment, 3 weeks with our [place] and in 2 months with Dr. Aundra Dubin with an echo prior to your exam.   Your physician has requested that you have an echocardiogram. Echocardiography is a painless test that uses sound waves to create images of your heart. It provides your doctor with information about the size and shape of your heart and how well your heart's chambers and valves are working. This procedure takes approximately one hour. There are no restrictions for this procedure.  If you have any questions or concerns before your next appointment please send [place] a message through [place] or call our office at [phone].    TO LEAVE A MESSAGE FOR THE NURSE SELECT OPTION 2, PLEASE LEAVE A MESSAGE INCLUDING: YOUR NAME DATE OF BIRTH CALL BACK NUMBER REASON FOR CALL**this is important as we prioritize the call backs  YOU WILL RECEIVE A CALL BACK THE SAME DAY AS LONG AS YOU CALL BEFORE 4:00 PM   Do the following things EVERYDAY: Weigh yourself in the morning before breakfast. Write it down and keep it in a log. Take your medicines as prescribed Eat low salt foods--Limit salt (sodium) to 2000 mg per day.  Stay as active as you can everyday Limit all fluids for the day to less than 2 liters   At the [facility], you and your health needs are our priority. As part of our continuing mission to provide you with exceptional heart care, we have created designated Provider Care Teams. These Care Teams include your  primary Cardiologist (physician) and Advanced Practice Providers (APPs- Physician Assistants and Nurse Practitioners) who all work together to provide you with the care you need, when you need it.   You may see any of the following providers on your designated Care Team at your next follow up: Dr Glori Bickers Dr Haynes Kerns, NP Lyda Jester, [place] Audry Riles, PharmD   Please be sure to bring in all your medications bottles to every appointment.
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
## 2021-05-31 ENCOUNTER — Telehealth (HOSPITAL_COMMUNITY): Payer: Self-pay | Admitting: *Deleted

## 2021-05-31 DIAGNOSIS — E1129 Type 2 diabetes mellitus with other diabetic kidney complication: Secondary | ICD-10-CM | POA: Diagnosis not present

## 2021-05-31 NOTE — Telephone Encounter (Signed)
Auth for [facility] pet scan approved from 06/22/21-07/22/21  Auth # [id number]

## 2021-06-01 ENCOUNTER — Other Ambulatory Visit (HOSPITAL_COMMUNITY): Payer: Self-pay | Admitting: Cardiology

## 2021-06-07 DIAGNOSIS — I5022 Chronic systolic (congestive) heart failure: Secondary | ICD-10-CM | POA: Diagnosis not present

## 2021-06-07 DIAGNOSIS — R7309 Other abnormal glucose: Secondary | ICD-10-CM | POA: Diagnosis not present

## 2021-06-07 DIAGNOSIS — E1129 Type 2 diabetes mellitus with other diabetic kidney complication: Secondary | ICD-10-CM | POA: Diagnosis not present

## 2021-06-07 NOTE — Patient Instructions (Addendum)
It was a pleasure seeing you today!  MEDICATIONS: Continue Prednisone down titration per protocol: [facility] Immunosuppressive Therapy for Cardiac Sarcoid  Prednisone  12/20-01/16: prednisone 15 mg (1.5 tablets) daily 01/17-01/30: prednisone 10 mg (1 tablet) per day; discontinue atovaquone 1/31-2/13: prednisone 7.5 mg (1.5 tablets of the prednisone 5 mg strength) daily  2/14-2/27: prednisone 5 mg (1 tablet) daily  2/28-3/13: Prednisone 2.5 mg (0.5 tablet) daily  3/14: stop prednisone   Methotrexate  -Increase Methotrexate to 20 mg (8 tablets) weekly.  -Call if you have questions about your medications.  LABS: -We will call you if your labs need attention.  NEXT APPOINTMENT: Return to clinic in 7 weeks with Dr. Aundra Dubin.  In general, to take care of your heart failure: -Limit your fluid intake to 2 Liters (half-gallon) per day.   -Limit your salt intake to ideally 2-3 grams (2000-3000 mg) per day. -Weigh yourself daily and record, and bring that "weight diary" to your next appointment.  (Weight gain of 2-3 pounds in 1 day typically means fluid weight.) -The medications for your heart are to help your heart and help you live longer.   -Please contact us before stopping any of your heart medications.  Call the clinic at [phone] with questions or to reschedule future appointments.
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
## 2021-06-08 ENCOUNTER — Ambulatory Visit (HOSPITAL_COMMUNITY)
Admission: RE | Admit: 2021-06-08 | Discharge: 2021-06-08 | Disposition: A | Discharge status: Home / Self Care | Payer: Medicare PPO | Source: Ambulatory Visit | Attending: Cardiology | Admitting: Cardiology

## 2021-06-08 ENCOUNTER — Encounter (HOSPITAL_COMMUNITY): Payer: Self-pay | Admitting: Cardiology

## 2021-06-08 ENCOUNTER — Other Ambulatory Visit: Payer: Self-pay

## 2021-06-08 DIAGNOSIS — I5022 Chronic systolic (congestive) heart failure: Secondary | ICD-10-CM | POA: Insufficient documentation

## 2021-06-08 LAB — BASIC METABOLIC PANEL
Anion gap: 10 (ref 5–15)
BUN: 21 mg/dL (ref 8–23)
CO2: 24 mmol/L (ref 22–32)
Calcium: 9.7 mg/dL (ref 8.9–10.3)
Chloride: 103 mmol/L (ref 98–111)
Creatinine, Ser: 1.07 mg/dL — ABNORMAL HIGH (ref 0.44–1.00)
GFR, Estimated: 55 mL/min — ABNORMAL LOW (ref >60)
Glucose, Bld: 262 mg/dL — ABNORMAL HIGH (ref 70–99)
Potassium: 4.2 mmol/L (ref 3.5–5.1)
Sodium: 137 mmol/L (ref 135–145)

## 2021-06-14 ENCOUNTER — Other Ambulatory Visit: Payer: Self-pay | Admitting: Physician Assistant

## 2021-06-23 ENCOUNTER — Ambulatory Visit (HOSPITAL_COMMUNITY)
Admission: RE | Admit: 2021-06-23 | Discharge: 2021-06-23 | Disposition: A | Discharge status: Home / Self Care | Payer: Medicare PPO | Source: Ambulatory Visit | Attending: Cardiology | Admitting: Cardiology

## 2021-06-23 ENCOUNTER — Other Ambulatory Visit: Payer: Self-pay

## 2021-06-23 VITALS — BP 118/66 | HR 62 | Wt 201.0 lb

## 2021-06-23 DIAGNOSIS — I428 Other cardiomyopathies: Secondary | ICD-10-CM | POA: Insufficient documentation

## 2021-06-23 DIAGNOSIS — Z7901 Long term (current) use of anticoagulants: Secondary | ICD-10-CM | POA: Diagnosis not present

## 2021-06-23 DIAGNOSIS — I5022 Chronic systolic (congestive) heart failure: Secondary | ICD-10-CM | POA: Diagnosis not present

## 2021-06-23 DIAGNOSIS — Z7984 Long term (current) use of oral hypoglycemic drugs: Secondary | ICD-10-CM | POA: Insufficient documentation

## 2021-06-23 DIAGNOSIS — Z79899 Other long term (current) drug therapy: Secondary | ICD-10-CM | POA: Insufficient documentation

## 2021-06-23 DIAGNOSIS — I447 Left bundle-branch block, unspecified: Secondary | ICD-10-CM | POA: Diagnosis not present

## 2021-06-23 DIAGNOSIS — I5041 Acute combined systolic (congestive) and diastolic (congestive) heart failure: Secondary | ICD-10-CM

## 2021-06-23 DIAGNOSIS — E785 Hyperlipidemia, unspecified: Secondary | ICD-10-CM | POA: Diagnosis not present

## 2021-06-23 LAB — CBC WITH DIFFERENTIAL/PLATELET
Abs Immature Granulocytes: 0.06 10*3/uL (ref 0.00–0.07)
Basophils Absolute: 0 10*3/uL (ref 0.0–0.1)
Basophils Relative: 0 %
Eosinophils Absolute: 0 10*3/uL (ref 0.0–0.5)
Eosinophils Relative: 1 %
HCT: 36.2 % (ref 36.0–46.0)
Hemoglobin: 12.4 g/dL (ref 12.0–15.0)
Immature Granulocytes: 1 %
Lymphocytes Relative: 14 %
Lymphs Abs: 1.1 10*3/uL (ref 0.7–4.0)
MCH: 33.3 pg (ref 26.0–34.0)
MCHC: 34.3 g/dL (ref 30.0–36.0)
MCV: 97.3 fL (ref 80.0–100.0)
Monocytes Absolute: 0.2 10*3/uL (ref 0.1–1.0)
Monocytes Relative: 3 %
Neutro Abs: 6.3 10*3/uL (ref 1.7–7.7)
Neutrophils Relative %: 81 %
Platelets: 142 10*3/uL — ABNORMAL LOW (ref 150–400)
RBC: 3.72 MIL/uL — ABNORMAL LOW (ref 3.87–5.11)
RDW: 15.9 % — ABNORMAL HIGH (ref 11.5–15.5)
WBC: 7.7 10*3/uL (ref 4.0–10.5)
nRBC: 0.3 % — ABNORMAL HIGH (ref 0.0–0.2)

## 2021-06-23 LAB — COMPREHENSIVE METABOLIC PANEL
ALT: 19 U/L (ref 0–44)
AST: 17 U/L (ref 15–41)
Albumin: 3.8 g/dL (ref 3.5–5.0)
Alkaline Phosphatase: 37 U/L — ABNORMAL LOW (ref 38–126)
Anion gap: 9 (ref 5–15)
BUN: 16 mg/dL (ref 8–23)
CO2: 24 mmol/L (ref 22–32)
Calcium: 9 mg/dL (ref 8.9–10.3)
Chloride: 104 mmol/L (ref 98–111)
Creatinine, Ser: 0.91 mg/dL (ref 0.44–1.00)
GFR, Estimated: >60 mL/min (ref >60)
Glucose, Bld: 199 mg/dL — ABNORMAL HIGH (ref 70–99)
Potassium: 4.2 mmol/L (ref 3.5–5.1)
Sodium: 137 mmol/L (ref 135–145)
Total Bilirubin: 1.4 mg/dL — ABNORMAL HIGH (ref 0.3–1.2)
Total Protein: 6 g/dL — ABNORMAL LOW (ref 6.5–8.1)

## 2021-06-23 MED ORDER — PREDNISONE 5 MG PO TABS: ORAL_TABLET | ORAL | 3 refills | Status: DC

## 2021-06-23 MED ORDER — METHOTREXATE SODIUM 2.5 MG PO TABS: ORAL_TABLET | ORAL | 6 refills | Status: DC

## 2021-07-02 ENCOUNTER — Ambulatory Visit: Payer: Medicare PPO | Admitting: Adult Health

## 2021-07-05 ENCOUNTER — Other Ambulatory Visit: Payer: Self-pay

## 2021-07-05 MED ORDER — ENTRESTO 49-51 MG PO TABS: 1.0000 | ORAL_TABLET | Freq: Two times a day (BID) | ORAL | 11 refills | Status: DC

## 2021-07-19 ENCOUNTER — Other Ambulatory Visit (HOSPITAL_COMMUNITY): Payer: Self-pay | Admitting: Cardiology

## 2021-07-20 ENCOUNTER — Other Ambulatory Visit: Payer: Self-pay | Admitting: Adult Health

## 2021-07-20 ENCOUNTER — Encounter (HOSPITAL_COMMUNITY): Payer: Self-pay | Admitting: Cardiology

## 2021-07-22 NOTE — Telephone Encounter (Addendum)
Pt aware, agreeable, and verbalized understanding  Vest is backed up and she is sending it back to the company today.  ----- Message from Larey Dresser, MD sent at 07/21/2021  6:39 PM EST ----- She can stop Lifevest. Make sure she had an echo with her next appointment.

## 2021-07-27 ENCOUNTER — Telehealth (HOSPITAL_COMMUNITY): Payer: Self-pay | Admitting: *Deleted

## 2021-07-27 NOTE — Telephone Encounter (Signed)
Pet scan auth extended   07/29/21-08/28/21  Auth # [id number]

## 2021-07-29 DIAGNOSIS — D860 Sarcoidosis of lung: Secondary | ICD-10-CM | POA: Diagnosis not present

## 2021-07-29 DIAGNOSIS — I493 Ventricular premature depolarization: Secondary | ICD-10-CM | POA: Diagnosis not present

## 2021-07-29 DIAGNOSIS — D8685 Sarcoid myocarditis: Secondary | ICD-10-CM | POA: Diagnosis not present

## 2021-07-29 DIAGNOSIS — R9439 Abnormal result of other cardiovascular function study: Secondary | ICD-10-CM | POA: Diagnosis not present

## 2021-08-02 ENCOUNTER — Other Ambulatory Visit: Payer: Self-pay

## 2021-08-02 ENCOUNTER — Ambulatory Visit: Payer: Medicare PPO | Admitting: Adult Health

## 2021-08-02 ENCOUNTER — Encounter: Payer: Self-pay | Admitting: Adult Health

## 2021-08-02 VITALS — BP 99/60 | HR 66 | Ht 66.0 in | Wt 201.0 lb

## 2021-08-02 DIAGNOSIS — L900 Lichen sclerosus et atrophicus: Secondary | ICD-10-CM | POA: Diagnosis not present

## 2021-08-02 DIAGNOSIS — R350 Frequency of micturition: Secondary | ICD-10-CM

## 2021-08-02 DIAGNOSIS — R8290 Unspecified abnormal findings in urine: Secondary | ICD-10-CM

## 2021-08-02 DIAGNOSIS — N390 Urinary tract infection, site not specified: Secondary | ICD-10-CM

## 2021-08-02 LAB — POCT URINALYSIS DIPSTICK
Blood, UA: NEGATIVE
Glucose, UA: NEGATIVE
Leukocytes, UA: NEGATIVE
Nitrite, UA: POSITIVE
Odor: POSITIVE
Protein, UA: NEGATIVE

## 2021-08-02 MED ORDER — CEPHALEXIN 500 MG PO CAPS: 500.0000 mg | ORAL_CAPSULE | Freq: Four times a day (QID) | ORAL | 0 refills | Status: DC

## 2021-08-02 NOTE — Progress Notes (Signed)
°  Subjective:     Patient ID: [name], female   DOB: Mar 25, 1951, 71 y.o.   MRN: [id number]  HPI [name] is a 71 year old white female, widowed, PM in for 6 month check on lichen sclerosus, and she thinks it is good. She is having urinary frequency and urine has bad odor. She had CHF in September and has had to wear defib vest, til just recently, she is not driving. PCP is Dr Willey Blade.  Review of Systems +urinary frequency and urine has bad odor. Reviewed past medical,surgical, social and family history. Reviewed medications and allergies.     Objective:   Physical Exam BP 99/60 (BP Location: Right Arm, Patient Position: Sitting, Cuff Size: Normal)    Pulse 66    Ht 5\' 6"  (1.676 m)    Wt 201 lb (91.2 kg)    BMI 32.44 kg/m  Urine dipstick +nitrates and urine cloudy with odor.   Skin warm and dry.Pelvic: external genitalia is normal in appearance no lesions, skin thin and a little red at introitus. Examination chaperoned by Celene Squibb LPN  Assessment:     1. Lichen sclerosus et atrophicus Continue temovate Has refills  2. Bad odor of urine Urine sent for UA C&S Will rx Keflex  3. Urinary frequency Urine sent for UA C&S Will rx Keflex  4. Urinary tract infection without hematuria, site unspecified Will Rx keflex Urine sent for UA C&S  Meds ordered this encounter  Medications   cephALEXin (KEFLEX) 500 MG capsule    Sig: Take 1 capsule (500 mg total) by mouth 4 (four) times daily.    Dispense:  28 capsule    Refill:  0    Order Specific Question:   Supervising Provider    Answer:   Florian Buff [[id number]]       Plan:     Follow up in 6 months or sooner if needed

## 2021-08-03 LAB — URINALYSIS, ROUTINE W REFLEX MICROSCOPIC
Bilirubin, UA: NEGATIVE
Ketones, UA: NEGATIVE
Nitrite, UA: NEGATIVE
Specific Gravity, UA: >=1.03 — AB (ref 1.005–1.030)
Urobilinogen, Ur: 0.2 mg/dL (ref 0.2–1.0)
pH, UA: 6 (ref 5.0–7.5)

## 2021-08-03 LAB — MICROSCOPIC EXAMINATION
Casts: NONE SEEN /lpf
Epithelial Cells (non renal): NONE SEEN /hpf (ref 0–10)
WBC, UA: >30 /hpf — AB (ref 0–5)

## 2021-08-04 LAB — URINE CULTURE

## 2021-08-11 ENCOUNTER — Encounter (HOSPITAL_COMMUNITY): Payer: Self-pay | Admitting: Cardiology

## 2021-08-11 ENCOUNTER — Ambulatory Visit (HOSPITAL_COMMUNITY)
Admission: RE | Admit: 2021-08-11 | Discharge: 2021-08-11 | Disposition: A | Discharge status: Home / Self Care | Payer: Medicare PPO | Source: Ambulatory Visit | Attending: Internal Medicine | Admitting: Internal Medicine

## 2021-08-11 ENCOUNTER — Other Ambulatory Visit: Payer: Self-pay

## 2021-08-11 ENCOUNTER — Ambulatory Visit (HOSPITAL_BASED_OUTPATIENT_CLINIC_OR_DEPARTMENT_OTHER)
Admission: RE | Admit: 2021-08-11 | Discharge: 2021-08-11 | Disposition: A | Discharge status: Home / Self Care | Payer: Medicare PPO | Source: Ambulatory Visit | Attending: Cardiology | Admitting: Cardiology

## 2021-08-11 VITALS — BP 110/60 | HR 65 | Wt 202.0 lb

## 2021-08-11 DIAGNOSIS — Z7901 Long term (current) use of anticoagulants: Secondary | ICD-10-CM | POA: Diagnosis not present

## 2021-08-11 DIAGNOSIS — I471 Supraventricular tachycardia: Secondary | ICD-10-CM | POA: Diagnosis not present

## 2021-08-11 DIAGNOSIS — I5022 Chronic systolic (congestive) heart failure: Secondary | ICD-10-CM | POA: Diagnosis not present

## 2021-08-11 DIAGNOSIS — I5041 Acute combined systolic (congestive) and diastolic (congestive) heart failure: Secondary | ICD-10-CM | POA: Insufficient documentation

## 2021-08-11 DIAGNOSIS — D8685 Sarcoid myocarditis: Secondary | ICD-10-CM

## 2021-08-11 DIAGNOSIS — E119 Type 2 diabetes mellitus without complications: Secondary | ICD-10-CM | POA: Diagnosis not present

## 2021-08-11 DIAGNOSIS — I110 Hypertensive heart disease with heart failure: Secondary | ICD-10-CM | POA: Insufficient documentation

## 2021-08-11 DIAGNOSIS — I454 Nonspecific intraventricular block: Secondary | ICD-10-CM | POA: Diagnosis not present

## 2021-08-11 DIAGNOSIS — Z79899 Other long term (current) drug therapy: Secondary | ICD-10-CM | POA: Insufficient documentation

## 2021-08-11 DIAGNOSIS — I472 Ventricular tachycardia, unspecified: Secondary | ICD-10-CM | POA: Insufficient documentation

## 2021-08-11 DIAGNOSIS — Z7984 Long term (current) use of oral hypoglycemic drugs: Secondary | ICD-10-CM | POA: Diagnosis not present

## 2021-08-11 DIAGNOSIS — I428 Other cardiomyopathies: Secondary | ICD-10-CM | POA: Insufficient documentation

## 2021-08-11 DIAGNOSIS — E785 Hyperlipidemia, unspecified: Secondary | ICD-10-CM | POA: Diagnosis not present

## 2021-08-11 DIAGNOSIS — L905 Scar conditions and fibrosis of skin: Secondary | ICD-10-CM | POA: Insufficient documentation

## 2021-08-11 DIAGNOSIS — I358 Other nonrheumatic aortic valve disorders: Secondary | ICD-10-CM | POA: Insufficient documentation

## 2021-08-11 DIAGNOSIS — I3481 Nonrheumatic mitral (valve) annulus calcification: Secondary | ICD-10-CM | POA: Diagnosis not present

## 2021-08-11 DIAGNOSIS — R590 Localized enlarged lymph nodes: Secondary | ICD-10-CM | POA: Diagnosis not present

## 2021-08-11 LAB — ECHOCARDIOGRAM COMPLETE
Area-P 1/2: 2.56 cm2
Calc EF: 48.3 %
S' Lateral: 4.1 cm
Single Plane A2C EF: 43.3 %
Single Plane A4C EF: 51.2 %

## 2021-08-11 LAB — COMPREHENSIVE METABOLIC PANEL
ALT: 24 U/L (ref 0–44)
AST: 23 U/L (ref 15–41)
Albumin: 3.8 g/dL (ref 3.5–5.0)
Alkaline Phosphatase: 41 U/L (ref 38–126)
Anion gap: 11 (ref 5–15)
BUN: 9 mg/dL (ref 8–23)
CO2: 23 mmol/L (ref 22–32)
Calcium: 9.2 mg/dL (ref 8.9–10.3)
Chloride: 104 mmol/L (ref 98–111)
Creatinine, Ser: 0.79 mg/dL (ref 0.44–1.00)
GFR, Estimated: >60 mL/min (ref >60)
Glucose, Bld: 224 mg/dL — ABNORMAL HIGH (ref 70–99)
Potassium: 4.3 mmol/L (ref 3.5–5.1)
Sodium: 138 mmol/L (ref 135–145)
Total Bilirubin: 0.8 mg/dL (ref 0.3–1.2)
Total Protein: 6.2 g/dL — ABNORMAL LOW (ref 6.5–8.1)

## 2021-08-11 NOTE — Patient Instructions (Signed)
Thank you for your visit today.  Labs done today, your results will be available in MyChart, we will contact you for abnormal readings.  Your physician recommends that you schedule a follow-up appointment in: 3 months.  If you have any questions or concerns before your next appointment please send [place] a message through [place] or call our office at [phone].    TO LEAVE A MESSAGE FOR THE NURSE SELECT OPTION 2, PLEASE LEAVE A MESSAGE INCLUDING: YOUR NAME DATE OF BIRTH CALL BACK NUMBER REASON FOR CALL**this is important as we prioritize the call backs  YOU WILL RECEIVE A CALL BACK THE SAME DAY AS LONG AS YOU CALL BEFORE 4:00 PM  At the [facility], you and your health needs are our priority. As part of our continuing mission to provide you with exceptional heart care, we have created designated Provider Care Teams. These Care Teams include your primary Cardiologist (physician) and Advanced Practice Providers (APPs- Physician Assistants and Nurse Practitioners) who all work together to provide you with the care you need, when you need it.   You may see any of the following providers on your designated Care Team at your next follow up: Dr Glori Bickers Dr Haynes Kerns, NP Lyda Jester, [place] [facility] [place], [place] Audry Riles, PharmD   Please be sure to bring in all your medications bottles to every appointment.

## 2021-08-11 NOTE — Progress Notes (Signed)
°  Echocardiogram 2D Echocardiogram has been performed.  [name] 08/11/2021, 11:52 AM

## 2021-08-12 ENCOUNTER — Encounter (HOSPITAL_COMMUNITY): Payer: Self-pay | Admitting: Cardiology
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
## 2021-09-10 ENCOUNTER — Other Ambulatory Visit (HOSPITAL_COMMUNITY): Payer: Self-pay

## 2021-09-10 MED ORDER — PANTOPRAZOLE SODIUM 40 MG PO TBEC: 40.0000 mg | DELAYED_RELEASE_TABLET | Freq: Every evening | ORAL | 0 refills | Status: DC

## 2021-09-10 MED ORDER — FOLIC ACID 1 MG PO TABS: 1.0000 mg | ORAL_TABLET | Freq: Every day | ORAL | 0 refills | Status: DC

## 2021-09-21 DIAGNOSIS — E1129 Type 2 diabetes mellitus with other diabetic kidney complication: Secondary | ICD-10-CM | POA: Diagnosis not present

## 2021-09-28 DIAGNOSIS — N390 Urinary tract infection, site not specified: Secondary | ICD-10-CM | POA: Diagnosis not present

## 2021-09-28 DIAGNOSIS — I5022 Chronic systolic (congestive) heart failure: Secondary | ICD-10-CM | POA: Diagnosis not present

## 2021-09-28 DIAGNOSIS — R7309 Other abnormal glucose: Secondary | ICD-10-CM | POA: Diagnosis not present

## 2021-09-28 DIAGNOSIS — E1122 Type 2 diabetes mellitus with diabetic chronic kidney disease: Secondary | ICD-10-CM | POA: Diagnosis not present

## 2021-10-11 ENCOUNTER — Other Ambulatory Visit (HOSPITAL_COMMUNITY): Payer: Self-pay | Admitting: Internal Medicine

## 2021-10-11 DIAGNOSIS — Z780 Asymptomatic menopausal state: Secondary | ICD-10-CM

## 2021-10-12 ENCOUNTER — Other Ambulatory Visit (HOSPITAL_COMMUNITY): Payer: Self-pay | Admitting: Adult Health

## 2021-10-12 DIAGNOSIS — R928 Other abnormal and inconclusive findings on diagnostic imaging of breast: Secondary | ICD-10-CM

## 2021-10-25 ENCOUNTER — Other Ambulatory Visit (HOSPITAL_COMMUNITY): Payer: Self-pay | Admitting: Cardiology

## 2021-11-04 DIAGNOSIS — H2703 Aphakia, bilateral: Secondary | ICD-10-CM | POA: Diagnosis not present

## 2021-11-04 DIAGNOSIS — H401133 Primary open-angle glaucoma, bilateral, severe stage: Secondary | ICD-10-CM | POA: Diagnosis not present

## 2021-11-04 DIAGNOSIS — H442E3 Degenerative myopia with other maculopathy, bilateral eye: Secondary | ICD-10-CM | POA: Diagnosis not present

## 2021-11-04 DIAGNOSIS — E119 Type 2 diabetes mellitus without complications: Secondary | ICD-10-CM | POA: Diagnosis not present

## 2021-11-04 DIAGNOSIS — H33001 Unspecified retinal detachment with retinal break, right eye: Secondary | ICD-10-CM | POA: Diagnosis not present

## 2021-11-05 DIAGNOSIS — H903 Sensorineural hearing loss, bilateral: Secondary | ICD-10-CM | POA: Diagnosis not present

## 2021-11-08 DIAGNOSIS — H401133 Primary open-angle glaucoma, bilateral, severe stage: Secondary | ICD-10-CM | POA: Diagnosis not present

## 2021-11-10 ENCOUNTER — Encounter (HOSPITAL_COMMUNITY): Payer: Self-pay | Admitting: Cardiology

## 2021-11-10 ENCOUNTER — Ambulatory Visit (HOSPITAL_COMMUNITY)
Admission: RE | Admit: 2021-11-10 | Discharge: 2021-11-10 | Disposition: A | Discharge status: Home / Self Care | Payer: Medicare PPO | Source: Ambulatory Visit | Attending: Cardiology | Admitting: Cardiology

## 2021-11-10 VITALS — BP 122/80 | HR 79 | Wt 204.6 lb

## 2021-11-10 DIAGNOSIS — E785 Hyperlipidemia, unspecified: Secondary | ICD-10-CM | POA: Diagnosis not present

## 2021-11-10 DIAGNOSIS — L905 Scar conditions and fibrosis of skin: Secondary | ICD-10-CM | POA: Insufficient documentation

## 2021-11-10 DIAGNOSIS — Z79899 Other long term (current) drug therapy: Secondary | ICD-10-CM | POA: Insufficient documentation

## 2021-11-10 DIAGNOSIS — I110 Hypertensive heart disease with heart failure: Secondary | ICD-10-CM | POA: Diagnosis not present

## 2021-11-10 DIAGNOSIS — I472 Ventricular tachycardia, unspecified: Secondary | ICD-10-CM | POA: Diagnosis not present

## 2021-11-10 DIAGNOSIS — I5022 Chronic systolic (congestive) heart failure: Secondary | ICD-10-CM | POA: Diagnosis not present

## 2021-11-10 DIAGNOSIS — D8685 Sarcoid myocarditis: Secondary | ICD-10-CM

## 2021-11-10 DIAGNOSIS — Z7960 Long term (current) use of unspecified immunomodulators and immunosuppressants: Secondary | ICD-10-CM | POA: Diagnosis not present

## 2021-11-10 DIAGNOSIS — I5041 Acute combined systolic (congestive) and diastolic (congestive) heart failure: Secondary | ICD-10-CM | POA: Diagnosis not present

## 2021-11-10 DIAGNOSIS — D869 Sarcoidosis, unspecified: Secondary | ICD-10-CM | POA: Diagnosis not present

## 2021-11-10 DIAGNOSIS — D84821 Immunodeficiency due to drugs: Secondary | ICD-10-CM | POA: Insufficient documentation

## 2021-11-10 DIAGNOSIS — I447 Left bundle-branch block, unspecified: Secondary | ICD-10-CM | POA: Diagnosis not present

## 2021-11-10 DIAGNOSIS — I428 Other cardiomyopathies: Secondary | ICD-10-CM | POA: Diagnosis not present

## 2021-11-10 DIAGNOSIS — I471 Supraventricular tachycardia: Secondary | ICD-10-CM | POA: Diagnosis not present

## 2021-11-10 DIAGNOSIS — R590 Localized enlarged lymph nodes: Secondary | ICD-10-CM | POA: Insufficient documentation

## 2021-11-10 LAB — COMPREHENSIVE METABOLIC PANEL
ALT: 14 U/L (ref 0–44)
AST: 19 U/L (ref 15–41)
Albumin: 3.7 g/dL (ref 3.5–5.0)
Alkaline Phosphatase: 51 U/L (ref 38–126)
Anion gap: 8 (ref 5–15)
BUN: 14 mg/dL (ref 8–23)
CO2: 23 mmol/L (ref 22–32)
Calcium: 9.3 mg/dL (ref 8.9–10.3)
Chloride: 107 mmol/L (ref 98–111)
Creatinine, Ser: 0.77 mg/dL (ref 0.44–1.00)
GFR, Estimated: >60 mL/min (ref >60)
Glucose, Bld: 155 mg/dL — ABNORMAL HIGH (ref 70–99)
Potassium: 4.4 mmol/L (ref 3.5–5.1)
Sodium: 138 mmol/L (ref 135–145)
Total Bilirubin: 1.4 mg/dL — ABNORMAL HIGH (ref 0.3–1.2)
Total Protein: 6.5 g/dL (ref 6.5–8.1)

## 2021-11-10 LAB — CBC
HCT: 36.5 % (ref 36.0–46.0)
Hemoglobin: 12.7 g/dL (ref 12.0–15.0)
MCH: 32.6 pg (ref 26.0–34.0)
MCHC: 34.8 g/dL (ref 30.0–36.0)
MCV: 93.8 fL (ref 80.0–100.0)
Platelets: 157 10*3/uL (ref 150–400)
RBC: 3.89 MIL/uL (ref 3.87–5.11)
RDW: 12.9 % (ref 11.5–15.5)
WBC: 6.5 10*3/uL (ref 4.0–10.5)
nRBC: 0 % (ref 0.0–0.2)

## 2021-11-10 NOTE — Patient Instructions (Addendum)
There has been no changes to your medications. ? ?Labs done today, your results will be available in MyChart, we will contact you for abnormal readings. ? ? ?Your physician recommends that you schedule a follow-up appointment in: 3 months.   ? ?If you have any questions or concerns before your next appointment please send us a message through mychart or call our office at [phone].   ? ?TO LEAVE A MESSAGE FOR THE NURSE SELECT OPTION 2, PLEASE LEAVE A MESSAGE INCLUDING: ?YOUR NAME ?DATE OF BIRTH ?CALL BACK NUMBER ?REASON FOR CALL**this is important as we prioritize the call backs ? ?YOU WILL RECEIVE A CALL BACK THE SAME DAY AS LONG AS YOU CALL BEFORE 4:00 PM ? ?At the [facility], you and your health needs are our priority. As part of our continuing mission to provide you with exceptional heart care, we have created designated Provider Care Teams. These Care Teams include your primary Cardiologist (physician) and Advanced Practice Providers (APPs- Physician Assistants and Nurse Practitioners) who all work together to provide you with the care you need, when you need it.  ? ?You may see any of the following providers on your designated Care Team at your next follow up: ?Dr Daniel Bensimhon ?Dr Dalton McLean ?Amy Clegg, NP ?Brittainy Simmons, PA ?Jessica Milford,NP ?Lindsay Finch, PA ?Lauren Kemp, PharmD ? ? ?Please be sure to bring in all your medications bottles to every appointment.  ? ? ?
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
## 2021-12-05 ENCOUNTER — Other Ambulatory Visit (HOSPITAL_COMMUNITY): Payer: Self-pay | Admitting: Cardiology

## 2021-12-06 ENCOUNTER — Encounter (HOSPITAL_COMMUNITY): Payer: Self-pay

## 2021-12-11 ENCOUNTER — Encounter (HOSPITAL_COMMUNITY): Payer: Self-pay

## 2021-12-13 ENCOUNTER — Other Ambulatory Visit (HOSPITAL_COMMUNITY): Payer: Self-pay | Admitting: *Deleted

## 2021-12-13 MED ORDER — PANTOPRAZOLE SODIUM 40 MG PO TBEC: 40.0000 mg | DELAYED_RELEASE_TABLET | Freq: Every evening | ORAL | 3 refills | Status: DC

## 2021-12-13 MED ORDER — FOLIC ACID 1 MG PO TABS: 1.0000 mg | ORAL_TABLET | Freq: Every day | ORAL | 3 refills | Status: DC

## 2021-12-31 DIAGNOSIS — E785 Hyperlipidemia, unspecified: Secondary | ICD-10-CM | POA: Diagnosis not present

## 2021-12-31 DIAGNOSIS — E1129 Type 2 diabetes mellitus with other diabetic kidney complication: Secondary | ICD-10-CM | POA: Diagnosis not present

## 2021-12-31 DIAGNOSIS — I5022 Chronic systolic (congestive) heart failure: Secondary | ICD-10-CM | POA: Diagnosis not present

## 2021-12-31 DIAGNOSIS — Z79899 Other long term (current) drug therapy: Secondary | ICD-10-CM | POA: Diagnosis not present

## 2021-12-31 DIAGNOSIS — I10 Essential (primary) hypertension: Secondary | ICD-10-CM | POA: Diagnosis not present

## 2021-12-31 DIAGNOSIS — D696 Thrombocytopenia, unspecified: Secondary | ICD-10-CM | POA: Diagnosis not present

## 2022-01-10 ENCOUNTER — Other Ambulatory Visit (HOSPITAL_COMMUNITY): Payer: Self-pay | Admitting: Internal Medicine

## 2022-01-10 ENCOUNTER — Other Ambulatory Visit: Payer: Self-pay | Admitting: Internal Medicine

## 2022-01-10 DIAGNOSIS — R413 Other amnesia: Secondary | ICD-10-CM | POA: Diagnosis not present

## 2022-01-10 DIAGNOSIS — I5022 Chronic systolic (congestive) heart failure: Secondary | ICD-10-CM | POA: Diagnosis not present

## 2022-01-10 DIAGNOSIS — R7309 Other abnormal glucose: Secondary | ICD-10-CM | POA: Diagnosis not present

## 2022-01-10 DIAGNOSIS — E1122 Type 2 diabetes mellitus with diabetic chronic kidney disease: Secondary | ICD-10-CM | POA: Diagnosis not present

## 2022-01-17 ENCOUNTER — Other Ambulatory Visit (HOSPITAL_COMMUNITY): Payer: Medicare PPO

## 2022-01-20 ENCOUNTER — Other Ambulatory Visit (HOSPITAL_COMMUNITY): Payer: Medicare PPO

## 2022-01-24 ENCOUNTER — Ambulatory Visit (HOSPITAL_COMMUNITY)
Admission: RE | Admit: 2022-01-24 | Discharge: 2022-01-24 | Disposition: A | Discharge status: Home / Self Care | Payer: Medicare PPO | Source: Ambulatory Visit | Attending: Internal Medicine | Admitting: Internal Medicine

## 2022-01-24 DIAGNOSIS — G319 Degenerative disease of nervous system, unspecified: Secondary | ICD-10-CM | POA: Diagnosis not present

## 2022-01-24 DIAGNOSIS — R413 Other amnesia: Secondary | ICD-10-CM | POA: Insufficient documentation

## 2022-01-27 ENCOUNTER — Ambulatory Visit (HOSPITAL_COMMUNITY)
Admission: RE | Admit: 2022-01-27 | Discharge: 2022-01-27 | Disposition: A | Discharge status: Home / Self Care | Payer: Medicare PPO | Source: Ambulatory Visit | Attending: Internal Medicine | Admitting: Internal Medicine

## 2022-01-27 DIAGNOSIS — Z780 Asymptomatic menopausal state: Secondary | ICD-10-CM | POA: Insufficient documentation

## 2022-01-27 DIAGNOSIS — M85832 Other specified disorders of bone density and structure, left forearm: Secondary | ICD-10-CM | POA: Diagnosis not present

## 2022-01-27 DIAGNOSIS — R413 Other amnesia: Secondary | ICD-10-CM | POA: Diagnosis not present

## 2022-02-01 ENCOUNTER — Ambulatory Visit (HOSPITAL_COMMUNITY)
Admission: RE | Admit: 2022-02-01 | Discharge: 2022-02-01 | Disposition: A | Discharge status: Home / Self Care | Payer: Medicare PPO | Source: Ambulatory Visit | Attending: Adult Health | Admitting: Adult Health

## 2022-02-01 DIAGNOSIS — R928 Other abnormal and inconclusive findings on diagnostic imaging of breast: Secondary | ICD-10-CM | POA: Diagnosis not present

## 2022-02-01 DIAGNOSIS — N644 Mastodynia: Secondary | ICD-10-CM | POA: Diagnosis not present

## 2022-02-02 ENCOUNTER — Encounter (HOSPITAL_COMMUNITY): Payer: Self-pay

## 2022-02-02 ENCOUNTER — Other Ambulatory Visit (HOSPITAL_COMMUNITY): Payer: Self-pay | Admitting: *Deleted

## 2022-02-02 MED ORDER — METHOTREXATE 2.5 MG PO TABS: ORAL_TABLET | ORAL | 0 refills | Status: DC

## 2022-02-07 ENCOUNTER — Ambulatory Visit (HOSPITAL_COMMUNITY)
Admission: RE | Admit: 2022-02-07 | Discharge: 2022-02-07 | Disposition: A | Discharge status: Home / Self Care | Payer: Medicare PPO | Source: Ambulatory Visit | Attending: Family Medicine | Admitting: Family Medicine

## 2022-02-07 ENCOUNTER — Encounter (HOSPITAL_COMMUNITY): Payer: Self-pay

## 2022-02-07 VITALS — BP 98/68 | HR 61 | Wt 198.2 lb

## 2022-02-07 DIAGNOSIS — I493 Ventricular premature depolarization: Secondary | ICD-10-CM | POA: Diagnosis not present

## 2022-02-07 DIAGNOSIS — D869 Sarcoidosis, unspecified: Secondary | ICD-10-CM | POA: Insufficient documentation

## 2022-02-07 DIAGNOSIS — D8685 Sarcoid myocarditis: Secondary | ICD-10-CM

## 2022-02-07 DIAGNOSIS — R42 Dizziness and giddiness: Secondary | ICD-10-CM | POA: Diagnosis not present

## 2022-02-07 DIAGNOSIS — I110 Hypertensive heart disease with heart failure: Secondary | ICD-10-CM | POA: Diagnosis not present

## 2022-02-07 DIAGNOSIS — E119 Type 2 diabetes mellitus without complications: Secondary | ICD-10-CM | POA: Insufficient documentation

## 2022-02-07 DIAGNOSIS — I472 Ventricular tachycardia, unspecified: Secondary | ICD-10-CM | POA: Insufficient documentation

## 2022-02-07 DIAGNOSIS — I471 Supraventricular tachycardia: Secondary | ICD-10-CM | POA: Insufficient documentation

## 2022-02-07 DIAGNOSIS — Z7984 Long term (current) use of oral hypoglycemic drugs: Secondary | ICD-10-CM | POA: Insufficient documentation

## 2022-02-07 DIAGNOSIS — E785 Hyperlipidemia, unspecified: Secondary | ICD-10-CM | POA: Insufficient documentation

## 2022-02-07 DIAGNOSIS — I447 Left bundle-branch block, unspecified: Secondary | ICD-10-CM | POA: Diagnosis not present

## 2022-02-07 DIAGNOSIS — I428 Other cardiomyopathies: Secondary | ICD-10-CM | POA: Diagnosis not present

## 2022-02-07 DIAGNOSIS — R590 Localized enlarged lymph nodes: Secondary | ICD-10-CM | POA: Diagnosis not present

## 2022-02-07 DIAGNOSIS — I5022 Chronic systolic (congestive) heart failure: Secondary | ICD-10-CM | POA: Diagnosis not present

## 2022-02-07 DIAGNOSIS — Z79899 Other long term (current) drug therapy: Secondary | ICD-10-CM | POA: Diagnosis not present

## 2022-02-07 DIAGNOSIS — Z7960 Long term (current) use of unspecified immunomodulators and immunosuppressants: Secondary | ICD-10-CM | POA: Insufficient documentation

## 2022-02-07 LAB — COMPREHENSIVE METABOLIC PANEL
ALT: 16 U/L (ref 0–44)
AST: 20 U/L (ref 15–41)
Albumin: 3.8 g/dL (ref 3.5–5.0)
Alkaline Phosphatase: 45 U/L (ref 38–126)
Anion gap: 6 (ref 5–15)
BUN: 12 mg/dL (ref 8–23)
CO2: 27 mmol/L (ref 22–32)
Calcium: 9.1 mg/dL (ref 8.9–10.3)
Chloride: 106 mmol/L (ref 98–111)
Creatinine, Ser: 0.71 mg/dL (ref 0.44–1.00)
GFR, Estimated: >60 mL/min (ref >60)
Glucose, Bld: 136 mg/dL — ABNORMAL HIGH (ref 70–99)
Potassium: 4.2 mmol/L (ref 3.5–5.1)
Sodium: 139 mmol/L (ref 135–145)
Total Bilirubin: 1.2 mg/dL (ref 0.3–1.2)
Total Protein: 6.1 g/dL — ABNORMAL LOW (ref 6.5–8.1)

## 2022-02-07 LAB — CBC
HCT: 39.2 % (ref 36.0–46.0)
Hemoglobin: 13.1 g/dL (ref 12.0–15.0)
MCH: 31.3 pg (ref 26.0–34.0)
MCHC: 33.4 g/dL (ref 30.0–36.0)
MCV: 93.8 fL (ref 80.0–100.0)
Platelets: 133 10*3/uL — ABNORMAL LOW (ref 150–400)
RBC: 4.18 MIL/uL (ref 3.87–5.11)
RDW: 13.2 % (ref 11.5–15.5)
WBC: 6 10*3/uL (ref 4.0–10.5)
nRBC: 0 % (ref 0.0–0.2)

## 2022-02-07 MED ORDER — ENTRESTO 49-51 MG PO TABS: 1.0000 | ORAL_TABLET | Freq: Two times a day (BID) | ORAL | 11 refills | Status: DC
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
## 2022-02-07 NOTE — Patient Instructions (Signed)
EKG done today.  Labs done today. We will contact you only if your labs are abnormal.  RESTART Entresto 49-'51mg'$  (1 tablet) by mouth 2 times daily.   No other medication changes were made. Please continue all current medications as prescribed.  Your physician recommends that you schedule a follow-up appointment in: 3 months with Dr. Aundra Hatfield.  If you have any questions or concerns before your next appointment please send [place] a message through [place] or call our office at [phone].    TO LEAVE A MESSAGE FOR THE NURSE SELECT OPTION 2, PLEASE LEAVE A MESSAGE INCLUDING: YOUR NAME DATE OF BIRTH CALL BACK NUMBER REASON FOR CALL**this is important as we prioritize the call backs  YOU WILL RECEIVE A CALL BACK THE SAME DAY AS LONG AS YOU CALL BEFORE 4:00 PM   Do the following things EVERYDAY: Weigh yourself in the morning before breakfast. Write it down and keep it in a log. Take your medicines as prescribed Eat low salt foods--Limit salt (sodium) to 2000 mg per day.  Stay as active as you can everyday Limit all fluids for the day to less than 2 liters   At the [facility], you and your health needs are our priority. As part of our continuing mission to provide you with exceptional heart care, we have created designated Provider Care Teams. These Care Teams include your primary Cardiologist (physician) and Advanced Practice Providers (APPs- Physician Assistants and Nurse Practitioners) who all work together to provide you with the care you need, when you need it.   You may see any of the following providers on your designated Care Team at your next follow up: Dr Glori Bickers Dr Haynes Kerns, NP Lyda Jester, [place] Audry Riles, PharmD   Please be sure to bring in all your medications bottles to every appointment.

## 2022-02-14 ENCOUNTER — Encounter (HOSPITAL_COMMUNITY): Payer: Self-pay | Admitting: Cardiology

## 2022-03-03 DIAGNOSIS — E1129 Type 2 diabetes mellitus with other diabetic kidney complication: Secondary | ICD-10-CM | POA: Diagnosis not present

## 2022-03-03 DIAGNOSIS — Z79899 Other long term (current) drug therapy: Secondary | ICD-10-CM | POA: Diagnosis not present

## 2022-03-03 DIAGNOSIS — I5022 Chronic systolic (congestive) heart failure: Secondary | ICD-10-CM | POA: Diagnosis not present

## 2022-03-03 DIAGNOSIS — R413 Other amnesia: Secondary | ICD-10-CM | POA: Diagnosis not present

## 2022-03-07 ENCOUNTER — Encounter (HOSPITAL_COMMUNITY): Payer: Self-pay | Admitting: Cardiology

## 2022-03-07 ENCOUNTER — Ambulatory Visit (INDEPENDENT_AMBULATORY_CARE_PROVIDER_SITE_OTHER): Payer: Medicare PPO | Admitting: Adult Health

## 2022-03-07 ENCOUNTER — Encounter: Payer: Self-pay | Admitting: Adult Health

## 2022-03-07 VITALS — BP 87/61 | HR 69 | Ht 66.5 in | Wt 196.5 lb

## 2022-03-07 DIAGNOSIS — I959 Hypotension, unspecified: Secondary | ICD-10-CM | POA: Diagnosis not present

## 2022-03-07 DIAGNOSIS — Z1211 Encounter for screening for malignant neoplasm of colon: Secondary | ICD-10-CM | POA: Diagnosis not present

## 2022-03-07 DIAGNOSIS — Z01419 Encounter for gynecological examination (general) (routine) without abnormal findings: Secondary | ICD-10-CM | POA: Diagnosis not present

## 2022-03-07 DIAGNOSIS — L900 Lichen sclerosus et atrophicus: Secondary | ICD-10-CM | POA: Diagnosis not present

## 2022-03-07 DIAGNOSIS — R413 Other amnesia: Secondary | ICD-10-CM | POA: Diagnosis not present

## 2022-03-07 NOTE — Progress Notes (Signed)
Patient ID: [name], female   DOB: [birth year], 71 y.o.   MRN: [id number] History of Present Illness: [name] is a 71 year old white female, widowed, PM in for a well woman gyn exam. She has appt with Dr [name] on Thursday.  She is driving again. She saw cardiology 02/07/22  Her last pap was 11/20/19 and was negative for malignancy and HPV  PCP is Dr [name].  Current Medications, Allergies, Past Medical History, Past Surgical History, Family History and Social History were reviewed in [employer] record.     Review of Systems: Patient denies any headaches, hearing loss, fatigue, blurred vision,dizzines, shortness of breath, chest pain, abdominal pain, problems with bowel movements, urination, or intercourse.(Not active). No joint pain or mood swings.  She has short term memory problems. Had MRI, July had mild generalized cerebral atrophy and minimal chronic small vessel ischemic changes.    Physical Exam:BP (!) 87/61 (BP Location: Right Arm, Patient Position: Sitting, Cuff Size: Normal)   Pulse 69   Ht 5' 6.5" (1.689 m)   Wt 196 lb 8 oz (89.1 kg)   BMI 31.24 kg/m   General:  Well developed, well nourished, no acute distress Skin:  Warm and dry Neck:  Midline trachea, normal thyroid, good ROM, no lymphadenopathy,no carotid bruits heard Lungs; Clear to auscultation bilaterally Breast:  No dominant palpable mass, retraction, or nipple discharge Cardiovascular: Regular rate and rhythm Abdomen:  Soft, non tender, no hepatosplenomegaly Pelvic:  External genitalia is normal in appearance, no lesions.  The vagina pale with loss of rugae, skin thin and mild ly red at introitus. Urethra has no lesions or masses. The cervix is smooth.  Uterus is felt to be normal size, shape, and contour.  No adnexal masses or tenderness noted.Bladder is non tender, no masses felt. Rectal: Good sphincter tone, no polyps, or hemorrhoids felt.  Hemoccult negative. Extremities/musculoskeletal:   No swelling, no varicosities noted, no clubbing or cyanosis, has compression hose on. Psych:  No mood changes, alert and cooperative,seems happy AA is 0 Fall risk is low    03/07/2022    2:11 PM 11/25/2020   11:35 AM 11/20/2019    1:43 PM  Depression screen PHQ 2/9  Decreased Interest 0 0 0  Down, Depressed, Hopeless 0 0 0  PHQ - 2 Score 0 0 0  Altered sleeping 0 0 0  Tired, decreased energy 0 1 0  Change in appetite 0 3 0  Feeling bad or failure about yourself  0 0 0  Trouble concentrating 2 0 2  Moving slowly or fidgety/restless 0 0 0  Suicidal thoughts 0 0 0  PHQ-9 Score '2 4 2  '$ Difficult doing work/chores   Not difficult at all       03/07/2022    2:11 PM 11/25/2020   11:35 AM 11/20/2019    1:43 PM  GAD 7 : Generalized Anxiety Score  Nervous, Anxious, on Edge 0 0 0  Control/stop worrying 0 0 0  Worry too much - different things 0 0 0  Trouble relaxing 0 0 0  Restless 0 0 0  Easily annoyed or irritable 0 0 0  Afraid - awful might happen 0 0 0  Total GAD 7 Score 0 0 0  Anxiety Difficulty   Not difficult at all    Upstream - 03/07/22 1410       Pregnancy Intention Screening   Does the patient want to become pregnant in the next year? N/A  Does the patient's partner want to become pregnant in the next year? N/A    Would the patient like to discuss contraceptive options today? N/A      Contraception Wrap Up   Current Method Abstinence;Female Sterilization    End Method Abstinence;Female Sterilization    Contraception Counseling Provided No              Examination chaperoned by Paige Pupa LPN    Impression and Plan: 1. Encounter for well woman exam with routine gynecological exam Paps no longer needed Physical with PCP Labs with PCP Mammogram was negative for malignancy 02/01/22 has benign areas of fat necrosis right breast  Colonoscopy per GI  2. Encounter for screening fecal occult blood testing Hemoccult was negative   3. Lichen sclerosus et  atrophicus Continue temovate 2-3 x weekly has refills  Follow up in 1 year of sooner if needed   4. Memory difficulties No recent changes   5. Hypotension, unspecified hypotension type Follow up with PCP Thursday

## 2022-03-09 ENCOUNTER — Encounter (HOSPITAL_COMMUNITY): Payer: Self-pay | Admitting: Cardiology

## 2022-03-10 DIAGNOSIS — E1122 Type 2 diabetes mellitus with diabetic chronic kidney disease: Secondary | ICD-10-CM | POA: Diagnosis not present

## 2022-03-10 DIAGNOSIS — I10 Essential (primary) hypertension: Secondary | ICD-10-CM | POA: Diagnosis not present

## 2022-03-10 DIAGNOSIS — R7309 Other abnormal glucose: Secondary | ICD-10-CM | POA: Diagnosis not present

## 2022-03-10 DIAGNOSIS — I5022 Chronic systolic (congestive) heart failure: Secondary | ICD-10-CM | POA: Diagnosis not present

## 2022-03-10 DIAGNOSIS — D8685 Sarcoid myocarditis: Secondary | ICD-10-CM | POA: Diagnosis not present

## 2022-03-10 DIAGNOSIS — E785 Hyperlipidemia, unspecified: Secondary | ICD-10-CM | POA: Diagnosis not present

## 2022-03-10 DIAGNOSIS — D696 Thrombocytopenia, unspecified: Secondary | ICD-10-CM | POA: Diagnosis not present

## 2022-03-10 DIAGNOSIS — I493 Ventricular premature depolarization: Secondary | ICD-10-CM | POA: Diagnosis not present

## 2022-03-10 DIAGNOSIS — Z23 Encounter for immunization: Secondary | ICD-10-CM | POA: Diagnosis not present

## 2022-05-11 ENCOUNTER — Encounter (HOSPITAL_COMMUNITY): Payer: Self-pay | Admitting: Cardiology

## 2022-05-11 ENCOUNTER — Other Ambulatory Visit (HOSPITAL_COMMUNITY): Payer: Self-pay | Admitting: Cardiology

## 2022-05-11 ENCOUNTER — Ambulatory Visit (HOSPITAL_COMMUNITY)
Admission: RE | Admit: 2022-05-11 | Discharge: 2022-05-11 | Disposition: A | Discharge status: Home / Self Care | Payer: Medicare PPO | Source: Ambulatory Visit | Attending: Cardiology | Admitting: Cardiology

## 2022-05-11 VITALS — BP 88/58 | HR 69 | Wt 203.2 lb

## 2022-05-11 DIAGNOSIS — I428 Other cardiomyopathies: Secondary | ICD-10-CM | POA: Diagnosis not present

## 2022-05-11 DIAGNOSIS — I447 Left bundle-branch block, unspecified: Secondary | ICD-10-CM | POA: Insufficient documentation

## 2022-05-11 DIAGNOSIS — I959 Hypotension, unspecified: Secondary | ICD-10-CM | POA: Insufficient documentation

## 2022-05-11 DIAGNOSIS — I4719 Other supraventricular tachycardia: Secondary | ICD-10-CM | POA: Diagnosis not present

## 2022-05-11 DIAGNOSIS — R590 Localized enlarged lymph nodes: Secondary | ICD-10-CM | POA: Diagnosis not present

## 2022-05-11 DIAGNOSIS — I5041 Acute combined systolic (congestive) and diastolic (congestive) heart failure: Secondary | ICD-10-CM

## 2022-05-11 DIAGNOSIS — Z7984 Long term (current) use of oral hypoglycemic drugs: Secondary | ICD-10-CM | POA: Diagnosis not present

## 2022-05-11 DIAGNOSIS — Z8349 Family history of other endocrine, nutritional and metabolic diseases: Secondary | ICD-10-CM | POA: Insufficient documentation

## 2022-05-11 DIAGNOSIS — I472 Ventricular tachycardia, unspecified: Secondary | ICD-10-CM | POA: Diagnosis not present

## 2022-05-11 DIAGNOSIS — I110 Hypertensive heart disease with heart failure: Secondary | ICD-10-CM | POA: Diagnosis not present

## 2022-05-11 DIAGNOSIS — E785 Hyperlipidemia, unspecified: Secondary | ICD-10-CM | POA: Diagnosis not present

## 2022-05-11 DIAGNOSIS — Z79899 Other long term (current) drug therapy: Secondary | ICD-10-CM | POA: Insufficient documentation

## 2022-05-11 DIAGNOSIS — D869 Sarcoidosis, unspecified: Secondary | ICD-10-CM | POA: Insufficient documentation

## 2022-05-11 DIAGNOSIS — I5022 Chronic systolic (congestive) heart failure: Secondary | ICD-10-CM | POA: Diagnosis not present

## 2022-05-11 LAB — CBC
HCT: 38.8 % (ref 36.0–46.0)
Hemoglobin: 13.5 g/dL (ref 12.0–15.0)
MCH: 32.1 pg (ref 26.0–34.0)
MCHC: 34.8 g/dL (ref 30.0–36.0)
MCV: 92.2 fL (ref 80.0–100.0)
Platelets: 140 10*3/uL — ABNORMAL LOW (ref 150–400)
RBC: 4.21 MIL/uL (ref 3.87–5.11)
RDW: 13.4 % (ref 11.5–15.5)
WBC: 6.4 10*3/uL (ref 4.0–10.5)
nRBC: 0 % (ref 0.0–0.2)

## 2022-05-11 LAB — COMPREHENSIVE METABOLIC PANEL
ALT: 15 U/L (ref 0–44)
AST: 19 U/L (ref 15–41)
Albumin: 4.2 g/dL (ref 3.5–5.0)
Alkaline Phosphatase: 47 U/L (ref 38–126)
Anion gap: 7 (ref 5–15)
BUN: 14 mg/dL (ref 8–23)
CO2: 27 mmol/L (ref 22–32)
Calcium: 9.6 mg/dL (ref 8.9–10.3)
Chloride: 105 mmol/L (ref 98–111)
Creatinine, Ser: 0.75 mg/dL (ref 0.44–1.00)
GFR, Estimated: >60 mL/min (ref >60)
Glucose, Bld: 155 mg/dL — ABNORMAL HIGH (ref 70–99)
Potassium: 4.4 mmol/L (ref 3.5–5.1)
Sodium: 139 mmol/L (ref 135–145)
Total Bilirubin: 0.8 mg/dL (ref 0.3–1.2)
Total Protein: 6.7 g/dL (ref 6.5–8.1)

## 2022-05-11 MED ORDER — ENTRESTO 24-26 MG PO TABS: 1.0000 | ORAL_TABLET | Freq: Two times a day (BID) | ORAL | 11 refills | Status: DC

## 2022-05-11 MED ORDER — BISOPROLOL FUMARATE 5 MG PO TABS: 2.5000 mg | ORAL_TABLET | Freq: Every evening | ORAL | 3 refills | Status: DC
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
## 2022-05-11 NOTE — Patient Instructions (Addendum)
START Entresto 24/26 Twice daily  STOP Entresto 49/51   START Bisoprolol 2.5 mg nightly  Labs done today, your results will be available in MyChart, we will contact you for abnormal readings.  Your physician has requested that you have an echocardiogram. Echocardiography is a painless test that uses sound waves to create images of your heart. It provides your doctor with information about the size and shape of your heart and how well your heart's chambers and valves are working. This procedure takes approximately one hour. There are no restrictions for this procedure. Please do NOT wear cologne, perfume, aftershave, or lotions (deodorant is allowed). Please arrive 15 minutes prior to your appointment time.  Your physician recommends that you schedule a follow-up appointment in: 4 months with an echocardiogram (March 2024)  **please call the office in January to arrange your follow up appointment. **  If you have any questions or concerns before your next appointment please send [place] a message through [place] or call our office at [phone].    TO LEAVE A MESSAGE FOR THE NURSE SELECT OPTION 2, PLEASE LEAVE A MESSAGE INCLUDING: YOUR NAME DATE OF BIRTH CALL BACK NUMBER REASON FOR CALL**this is important as we prioritize the call backs  YOU WILL RECEIVE A CALL BACK THE SAME DAY AS LONG AS YOU CALL BEFORE 4:00 PM  At the [facility], you and your health needs are our priority. As part of our continuing mission to provide you with exceptional heart care, we have created designated Provider Care Teams. These Care Teams include your primary Cardiologist (physician) and Advanced Practice Providers (APPs- Physician Assistants and Nurse Practitioners) who all work together to provide you with the care you need, when you need it.   You may see any of the following providers on your designated Care Team at your next follow up: Dr Glori Bickers Dr Loralie Champagne Dr. Roxana Hires, NP Lyda Jester, [place] [facility] [place], [place] Forestine Na, NP Audry Riles, PharmD   Please be sure to bring in all your medications bottles to every appointment.

## 2022-05-12 ENCOUNTER — Telehealth (HOSPITAL_COMMUNITY): Payer: Self-pay

## 2022-05-12 ENCOUNTER — Encounter (HOSPITAL_COMMUNITY): Payer: Self-pay | Admitting: Cardiology

## 2022-05-12 NOTE — Telephone Encounter (Signed)
[name] called to confirm she is still taking her Carvedilol. She indicated at visit yesterday she told Dr. Aundra Dubin she was not taking, but checked when she got home last night. If any further questions, please call her back.

## 2022-05-12 NOTE — Telephone Encounter (Signed)
Patient advised and verbalized understanding 

## 2022-05-12 NOTE — Telephone Encounter (Signed)
Continue the carvedilol as she was doing and do not take the bisoprolol that I prescribed yesterday.

## 2022-05-13 MED ORDER — CARVEDILOL 6.25 MG PO TABS: 6.2500 mg | ORAL_TABLET | Freq: Two times a day (BID) | ORAL | 3 refills | Status: DC

## 2022-05-13 NOTE — Telephone Encounter (Signed)
Med list updated

## 2022-05-24 ENCOUNTER — Other Ambulatory Visit: Payer: Self-pay | Admitting: *Deleted

## 2022-05-24 MED ORDER — CARVEDILOL 6.25 MG PO TABS: 6.2500 mg | ORAL_TABLET | Freq: Two times a day (BID) | ORAL | 3 refills | Status: DC

## 2022-05-28 DIAGNOSIS — H401133 Primary open-angle glaucoma, bilateral, severe stage: Secondary | ICD-10-CM | POA: Diagnosis not present

## 2022-07-04 DIAGNOSIS — E1129 Type 2 diabetes mellitus with other diabetic kidney complication: Secondary | ICD-10-CM | POA: Diagnosis not present

## 2022-07-11 DIAGNOSIS — E1122 Type 2 diabetes mellitus with diabetic chronic kidney disease: Secondary | ICD-10-CM | POA: Diagnosis not present

## 2022-07-11 DIAGNOSIS — I5022 Chronic systolic (congestive) heart failure: Secondary | ICD-10-CM | POA: Diagnosis not present

## 2022-07-11 DIAGNOSIS — I10 Essential (primary) hypertension: Secondary | ICD-10-CM | POA: Diagnosis not present

## 2022-07-11 DIAGNOSIS — R7309 Other abnormal glucose: Secondary | ICD-10-CM | POA: Diagnosis not present

## 2022-08-16 ENCOUNTER — Encounter (HOSPITAL_COMMUNITY): Payer: Self-pay | Admitting: Cardiology

## 2022-08-16 DIAGNOSIS — S82155A Nondisplaced fracture of left tibial tuberosity, initial encounter for closed fracture: Secondary | ICD-10-CM | POA: Diagnosis not present

## 2022-08-16 DIAGNOSIS — M25562 Pain in left knee: Secondary | ICD-10-CM | POA: Diagnosis not present

## 2022-08-23 DIAGNOSIS — M25562 Pain in left knee: Secondary | ICD-10-CM | POA: Diagnosis not present

## 2022-09-06 ENCOUNTER — Encounter: Payer: Self-pay | Admitting: *Deleted

## 2022-09-06 DIAGNOSIS — M25562 Pain in left knee: Secondary | ICD-10-CM | POA: Diagnosis not present

## 2022-09-20 DIAGNOSIS — M25562 Pain in left knee: Secondary | ICD-10-CM | POA: Diagnosis not present

## 2022-09-26 ENCOUNTER — Other Ambulatory Visit (HOSPITAL_COMMUNITY): Payer: Self-pay | Admitting: Cardiology

## 2022-10-06 DIAGNOSIS — B353 Tinea pedis: Secondary | ICD-10-CM | POA: Diagnosis not present

## 2022-10-06 DIAGNOSIS — X32XXXA Exposure to sunlight, initial encounter: Secondary | ICD-10-CM | POA: Diagnosis not present

## 2022-10-06 DIAGNOSIS — L570 Actinic keratosis: Secondary | ICD-10-CM | POA: Diagnosis not present

## 2022-10-10 DIAGNOSIS — E1129 Type 2 diabetes mellitus with other diabetic kidney complication: Secondary | ICD-10-CM | POA: Diagnosis not present

## 2022-10-16 ENCOUNTER — Other Ambulatory Visit (HOSPITAL_COMMUNITY): Payer: Self-pay | Admitting: Cardiology

## 2022-10-25 DIAGNOSIS — M545 Low back pain, unspecified: Secondary | ICD-10-CM | POA: Diagnosis not present

## 2022-10-26 ENCOUNTER — Ambulatory Visit (HOSPITAL_BASED_OUTPATIENT_CLINIC_OR_DEPARTMENT_OTHER)
Admission: RE | Admit: 2022-10-26 | Discharge: 2022-10-26 | Disposition: A | Discharge status: Home / Self Care | Payer: Medicare PPO | Source: Ambulatory Visit | Attending: Cardiology | Admitting: Cardiology

## 2022-10-26 ENCOUNTER — Ambulatory Visit (HOSPITAL_COMMUNITY)
Admission: RE | Admit: 2022-10-26 | Discharge: 2022-10-26 | Disposition: A | Discharge status: Home / Self Care | Payer: Medicare PPO | Source: Ambulatory Visit | Attending: Cardiology | Admitting: Cardiology

## 2022-10-26 ENCOUNTER — Encounter (HOSPITAL_COMMUNITY): Payer: Self-pay | Admitting: Cardiology

## 2022-10-26 VITALS — BP 100/60 | HR 62 | Wt 194.6 lb

## 2022-10-26 DIAGNOSIS — Z79899 Other long term (current) drug therapy: Secondary | ICD-10-CM | POA: Diagnosis not present

## 2022-10-26 DIAGNOSIS — I5041 Acute combined systolic (congestive) and diastolic (congestive) heart failure: Secondary | ICD-10-CM

## 2022-10-26 DIAGNOSIS — L905 Scar conditions and fibrosis of skin: Secondary | ICD-10-CM | POA: Diagnosis not present

## 2022-10-26 DIAGNOSIS — I110 Hypertensive heart disease with heart failure: Secondary | ICD-10-CM | POA: Diagnosis not present

## 2022-10-26 DIAGNOSIS — I428 Other cardiomyopathies: Secondary | ICD-10-CM | POA: Insufficient documentation

## 2022-10-26 DIAGNOSIS — E119 Type 2 diabetes mellitus without complications: Secondary | ICD-10-CM | POA: Insufficient documentation

## 2022-10-26 DIAGNOSIS — I472 Ventricular tachycardia, unspecified: Secondary | ICD-10-CM | POA: Diagnosis not present

## 2022-10-26 DIAGNOSIS — E785 Hyperlipidemia, unspecified: Secondary | ICD-10-CM | POA: Diagnosis not present

## 2022-10-26 DIAGNOSIS — Z7984 Long term (current) use of oral hypoglycemic drugs: Secondary | ICD-10-CM | POA: Diagnosis not present

## 2022-10-26 DIAGNOSIS — D869 Sarcoidosis, unspecified: Secondary | ICD-10-CM | POA: Insufficient documentation

## 2022-10-26 DIAGNOSIS — I358 Other nonrheumatic aortic valve disorders: Secondary | ICD-10-CM | POA: Diagnosis not present

## 2022-10-26 LAB — CBC
HCT: 38.2 % (ref 36.0–46.0)
Hemoglobin: 13 g/dL (ref 12.0–15.0)
MCH: 32.3 pg (ref 26.0–34.0)
MCHC: 34 g/dL (ref 30.0–36.0)
MCV: 95 fL (ref 80.0–100.0)
Platelets: 130 10*3/uL — ABNORMAL LOW (ref 150–400)
RBC: 4.02 MIL/uL (ref 3.87–5.11)
RDW: 13.3 % (ref 11.5–15.5)
WBC: 5.5 10*3/uL (ref 4.0–10.5)
nRBC: 0 % (ref 0.0–0.2)

## 2022-10-26 LAB — COMPREHENSIVE METABOLIC PANEL
ALT: 19 U/L (ref 0–44)
AST: 21 U/L (ref 15–41)
Albumin: 3.9 g/dL (ref 3.5–5.0)
Alkaline Phosphatase: 40 U/L (ref 38–126)
Anion gap: 8 (ref 5–15)
BUN: 13 mg/dL (ref 8–23)
CO2: 27 mmol/L (ref 22–32)
Calcium: 9.5 mg/dL (ref 8.9–10.3)
Chloride: 104 mmol/L (ref 98–111)
Creatinine, Ser: 0.82 mg/dL (ref 0.44–1.00)
GFR, Estimated: >60 mL/min (ref >60)
Glucose, Bld: 109 mg/dL — ABNORMAL HIGH (ref 70–99)
Potassium: 4.4 mmol/L (ref 3.5–5.1)
Sodium: 139 mmol/L (ref 135–145)
Total Bilirubin: 1.3 mg/dL — ABNORMAL HIGH (ref 0.3–1.2)
Total Protein: 6.3 g/dL — ABNORMAL LOW (ref 6.5–8.1)

## 2022-10-26 LAB — ECHOCARDIOGRAM COMPLETE
Area-P 1/2: 3.1 cm2
S' Lateral: 3.6 cm

## 2022-10-26 MED ORDER — PERFLUTREN LIPID MICROSPHERE
1.0000 mL | INTRAVENOUS | Status: DC | PRN
  Administered 2022-10-26: 2 mL via INTRAVENOUS
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
## 2022-10-26 NOTE — Patient Instructions (Addendum)
There has been no changes to your medications.  Labs done today, your results will be available in MyChart, we will contact you for abnormal readings.  Repeat blood work in 3 months  Your physician recommends that you schedule a follow-up appointment in: 6 months (November) ** please call the office September to arrange your follow up appointment. **  If you have any questions or concerns before your next appointment please send [place] a message through [place] or call our office at [phone].    TO LEAVE A MESSAGE FOR THE NURSE SELECT OPTION 2, PLEASE LEAVE A MESSAGE INCLUDING: YOUR NAME DATE OF BIRTH CALL BACK NUMBER REASON FOR CALL**this is important as we prioritize the call backs  YOU WILL RECEIVE A CALL BACK THE SAME DAY AS LONG AS YOU CALL BEFORE 4:00 PM  At the [facility], you and your health needs are our priority. As part of our continuing mission to provide you with exceptional heart care, we have created designated Provider Care Teams. These Care Teams include your primary Cardiologist (physician) and Advanced Practice Providers (APPs- Physician Assistants and Nurse Practitioners) who all work together to provide you with the care you need, when you need it.   You may see any of the following providers on your designated Care Team at your next follow up: Dr Arvilla Meres Dr Marca Ancona Dr. Marcos Eke, NP Robbie Lis, [place] [facility] [place], [place] Brynda Peon, NP Karle Plumber, PharmD   Please be sure to bring in all your medications bottles to every appointment.    Thank you for choosing [facility]

## 2022-10-31 ENCOUNTER — Encounter (HOSPITAL_COMMUNITY): Payer: Self-pay | Admitting: Cardiology

## 2022-10-31 DIAGNOSIS — H401133 Primary open-angle glaucoma, bilateral, severe stage: Secondary | ICD-10-CM | POA: Diagnosis not present

## 2022-11-03 ENCOUNTER — Ambulatory Visit (HOSPITAL_COMMUNITY)
Admission: RE | Admit: 2022-11-03 | Discharge: 2022-11-03 | Disposition: A | Discharge status: Home / Self Care | Payer: Medicare PPO | Source: Ambulatory Visit | Attending: Internal Medicine | Admitting: Internal Medicine

## 2022-11-03 ENCOUNTER — Inpatient Hospital Stay (HOSPITAL_COMMUNITY)
Admission: RE | Admit: 2022-11-03 | Discharge: 2022-11-03 | Disposition: A | Discharge status: Home / Self Care | Payer: Medicare PPO | Source: Ambulatory Visit | Attending: Cardiology | Admitting: Cardiology

## 2022-11-03 ENCOUNTER — Other Ambulatory Visit (HOSPITAL_COMMUNITY): Payer: Self-pay | Admitting: Cardiology

## 2022-11-03 DIAGNOSIS — R002 Palpitations: Secondary | ICD-10-CM

## 2022-11-03 DIAGNOSIS — H35372 Puckering of macula, left eye: Secondary | ICD-10-CM | POA: Diagnosis not present

## 2022-11-03 DIAGNOSIS — E119 Type 2 diabetes mellitus without complications: Secondary | ICD-10-CM | POA: Diagnosis not present

## 2022-11-03 DIAGNOSIS — I5041 Acute combined systolic (congestive) and diastolic (congestive) heart failure: Secondary | ICD-10-CM | POA: Diagnosis not present

## 2022-11-03 DIAGNOSIS — H2703 Aphakia, bilateral: Secondary | ICD-10-CM | POA: Diagnosis not present

## 2022-11-03 DIAGNOSIS — H401133 Primary open-angle glaucoma, bilateral, severe stage: Secondary | ICD-10-CM | POA: Diagnosis not present

## 2022-11-03 DIAGNOSIS — H33001 Unspecified retinal detachment with retinal break, right eye: Secondary | ICD-10-CM | POA: Diagnosis not present

## 2022-11-03 DIAGNOSIS — H442E3 Degenerative myopia with other maculopathy, bilateral eye: Secondary | ICD-10-CM | POA: Diagnosis not present

## 2022-11-18 DIAGNOSIS — R002 Palpitations: Secondary | ICD-10-CM | POA: Diagnosis not present

## 2022-11-24 ENCOUNTER — Telehealth (HOSPITAL_COMMUNITY): Payer: Self-pay

## 2022-11-24 ENCOUNTER — Other Ambulatory Visit (HOSPITAL_COMMUNITY): Payer: Self-pay

## 2022-11-24 DIAGNOSIS — E1122 Type 2 diabetes mellitus with diabetic chronic kidney disease: Secondary | ICD-10-CM | POA: Diagnosis not present

## 2022-11-24 DIAGNOSIS — G629 Polyneuropathy, unspecified: Secondary | ICD-10-CM | POA: Diagnosis not present

## 2022-11-24 DIAGNOSIS — B353 Tinea pedis: Secondary | ICD-10-CM | POA: Diagnosis not present

## 2022-11-24 DIAGNOSIS — R7309 Other abnormal glucose: Secondary | ICD-10-CM | POA: Diagnosis not present

## 2022-11-24 MED ORDER — SPIRONOLACTONE 25 MG PO TABS: 12.5000 mg | ORAL_TABLET | Freq: Every evening | ORAL | 3 refills | Status: DC

## 2022-11-24 MED ORDER — CARVEDILOL 12.5 MG PO TABS: 12.5000 mg | ORAL_TABLET | Freq: Two times a day (BID) | ORAL | 3 refills | Status: DC

## 2022-11-24 NOTE — Telephone Encounter (Addendum)
Pt aware, agreeable, and verbalized understanding  Med list updated   ----- Message from Laurey Morale, MD sent at 11/21/2022  8:51 PM EDT ----- Frequent PACs and PVCs.  Would increase Coreg to 12.5 mg bid and decrease spironolactone to 12.5 mg daily (worry about drop in BP with increase in Coreg).

## 2022-12-12 DIAGNOSIS — H903 Sensorineural hearing loss, bilateral: Secondary | ICD-10-CM | POA: Diagnosis not present

## 2022-12-20 DIAGNOSIS — M5134 Other intervertebral disc degeneration, thoracic region: Secondary | ICD-10-CM | POA: Diagnosis not present

## 2022-12-20 DIAGNOSIS — M5033 Other cervical disc degeneration, cervicothoracic region: Secondary | ICD-10-CM | POA: Diagnosis not present

## 2022-12-20 DIAGNOSIS — M9902 Segmental and somatic dysfunction of thoracic region: Secondary | ICD-10-CM | POA: Diagnosis not present

## 2022-12-20 DIAGNOSIS — M9901 Segmental and somatic dysfunction of cervical region: Secondary | ICD-10-CM | POA: Diagnosis not present

## 2022-12-21 DIAGNOSIS — M5134 Other intervertebral disc degeneration, thoracic region: Secondary | ICD-10-CM | POA: Diagnosis not present

## 2022-12-21 DIAGNOSIS — M9902 Segmental and somatic dysfunction of thoracic region: Secondary | ICD-10-CM | POA: Diagnosis not present

## 2022-12-21 DIAGNOSIS — M5033 Other cervical disc degeneration, cervicothoracic region: Secondary | ICD-10-CM | POA: Diagnosis not present

## 2022-12-21 DIAGNOSIS — M9901 Segmental and somatic dysfunction of cervical region: Secondary | ICD-10-CM | POA: Diagnosis not present

## 2023-01-03 DIAGNOSIS — M9902 Segmental and somatic dysfunction of thoracic region: Secondary | ICD-10-CM | POA: Diagnosis not present

## 2023-01-03 DIAGNOSIS — M9901 Segmental and somatic dysfunction of cervical region: Secondary | ICD-10-CM | POA: Diagnosis not present

## 2023-01-03 DIAGNOSIS — M5134 Other intervertebral disc degeneration, thoracic region: Secondary | ICD-10-CM | POA: Diagnosis not present

## 2023-01-03 DIAGNOSIS — M5033 Other cervical disc degeneration, cervicothoracic region: Secondary | ICD-10-CM | POA: Diagnosis not present

## 2023-01-04 DIAGNOSIS — M5134 Other intervertebral disc degeneration, thoracic region: Secondary | ICD-10-CM | POA: Diagnosis not present

## 2023-01-04 DIAGNOSIS — M5033 Other cervical disc degeneration, cervicothoracic region: Secondary | ICD-10-CM | POA: Diagnosis not present

## 2023-01-04 DIAGNOSIS — M9902 Segmental and somatic dysfunction of thoracic region: Secondary | ICD-10-CM | POA: Diagnosis not present

## 2023-01-04 DIAGNOSIS — M9901 Segmental and somatic dysfunction of cervical region: Secondary | ICD-10-CM | POA: Diagnosis not present

## 2023-01-05 DIAGNOSIS — M5134 Other intervertebral disc degeneration, thoracic region: Secondary | ICD-10-CM | POA: Diagnosis not present

## 2023-01-05 DIAGNOSIS — M5033 Other cervical disc degeneration, cervicothoracic region: Secondary | ICD-10-CM | POA: Diagnosis not present

## 2023-01-05 DIAGNOSIS — M9902 Segmental and somatic dysfunction of thoracic region: Secondary | ICD-10-CM | POA: Diagnosis not present

## 2023-01-05 DIAGNOSIS — M9901 Segmental and somatic dysfunction of cervical region: Secondary | ICD-10-CM | POA: Diagnosis not present

## 2023-01-09 DIAGNOSIS — M9901 Segmental and somatic dysfunction of cervical region: Secondary | ICD-10-CM | POA: Diagnosis not present

## 2023-01-09 DIAGNOSIS — M9902 Segmental and somatic dysfunction of thoracic region: Secondary | ICD-10-CM | POA: Diagnosis not present

## 2023-01-09 DIAGNOSIS — M5033 Other cervical disc degeneration, cervicothoracic region: Secondary | ICD-10-CM | POA: Diagnosis not present

## 2023-01-09 DIAGNOSIS — M5134 Other intervertebral disc degeneration, thoracic region: Secondary | ICD-10-CM | POA: Diagnosis not present

## 2023-01-11 DIAGNOSIS — M5134 Other intervertebral disc degeneration, thoracic region: Secondary | ICD-10-CM | POA: Diagnosis not present

## 2023-01-11 DIAGNOSIS — M5033 Other cervical disc degeneration, cervicothoracic region: Secondary | ICD-10-CM | POA: Diagnosis not present

## 2023-01-11 DIAGNOSIS — M9901 Segmental and somatic dysfunction of cervical region: Secondary | ICD-10-CM | POA: Diagnosis not present

## 2023-01-11 DIAGNOSIS — M9902 Segmental and somatic dysfunction of thoracic region: Secondary | ICD-10-CM | POA: Diagnosis not present

## 2023-01-12 DIAGNOSIS — M9901 Segmental and somatic dysfunction of cervical region: Secondary | ICD-10-CM | POA: Diagnosis not present

## 2023-01-12 DIAGNOSIS — M9902 Segmental and somatic dysfunction of thoracic region: Secondary | ICD-10-CM | POA: Diagnosis not present

## 2023-01-12 DIAGNOSIS — M5033 Other cervical disc degeneration, cervicothoracic region: Secondary | ICD-10-CM | POA: Diagnosis not present

## 2023-01-12 DIAGNOSIS — M5134 Other intervertebral disc degeneration, thoracic region: Secondary | ICD-10-CM | POA: Diagnosis not present

## 2023-01-16 DIAGNOSIS — M5033 Other cervical disc degeneration, cervicothoracic region: Secondary | ICD-10-CM | POA: Diagnosis not present

## 2023-01-16 DIAGNOSIS — M9902 Segmental and somatic dysfunction of thoracic region: Secondary | ICD-10-CM | POA: Diagnosis not present

## 2023-01-16 DIAGNOSIS — H52223 Regular astigmatism, bilateral: Secondary | ICD-10-CM | POA: Diagnosis not present

## 2023-01-16 DIAGNOSIS — M9901 Segmental and somatic dysfunction of cervical region: Secondary | ICD-10-CM | POA: Diagnosis not present

## 2023-01-16 DIAGNOSIS — H524 Presbyopia: Secondary | ICD-10-CM | POA: Diagnosis not present

## 2023-01-16 DIAGNOSIS — M5134 Other intervertebral disc degeneration, thoracic region: Secondary | ICD-10-CM | POA: Diagnosis not present

## 2023-01-17 DIAGNOSIS — M5033 Other cervical disc degeneration, cervicothoracic region: Secondary | ICD-10-CM | POA: Diagnosis not present

## 2023-01-17 DIAGNOSIS — M9902 Segmental and somatic dysfunction of thoracic region: Secondary | ICD-10-CM | POA: Diagnosis not present

## 2023-01-17 DIAGNOSIS — M5134 Other intervertebral disc degeneration, thoracic region: Secondary | ICD-10-CM | POA: Diagnosis not present

## 2023-01-17 DIAGNOSIS — M9901 Segmental and somatic dysfunction of cervical region: Secondary | ICD-10-CM | POA: Diagnosis not present

## 2023-01-25 ENCOUNTER — Encounter: Payer: Self-pay | Admitting: *Deleted

## 2023-01-26 ENCOUNTER — Ambulatory Visit (HOSPITAL_COMMUNITY)
Admission: RE | Admit: 2023-01-26 | Discharge: 2023-01-26 | Disposition: A | Discharge status: Home / Self Care | Payer: Medicare PPO | Source: Ambulatory Visit | Attending: Cardiology | Admitting: Cardiology

## 2023-02-24 ENCOUNTER — Other Ambulatory Visit (HOSPITAL_COMMUNITY): Payer: Self-pay | Admitting: Cardiology

## 2023-03-07 ENCOUNTER — Telehealth: Payer: Self-pay | Admitting: Internal Medicine

## 2023-03-07 DIAGNOSIS — E1129 Type 2 diabetes mellitus with other diabetic kidney complication: Secondary | ICD-10-CM | POA: Diagnosis not present

## 2023-03-07 DIAGNOSIS — R413 Other amnesia: Secondary | ICD-10-CM | POA: Diagnosis not present

## 2023-03-07 DIAGNOSIS — D8685 Sarcoid myocarditis: Secondary | ICD-10-CM | POA: Diagnosis not present

## 2023-03-07 DIAGNOSIS — I10 Essential (primary) hypertension: Secondary | ICD-10-CM | POA: Diagnosis not present

## 2023-03-07 DIAGNOSIS — D696 Thrombocytopenia, unspecified: Secondary | ICD-10-CM | POA: Diagnosis not present

## 2023-03-07 DIAGNOSIS — E785 Hyperlipidemia, unspecified: Secondary | ICD-10-CM | POA: Diagnosis not present

## 2023-03-07 DIAGNOSIS — I5022 Chronic systolic (congestive) heart failure: Secondary | ICD-10-CM | POA: Diagnosis not present

## 2023-03-07 DIAGNOSIS — Z79899 Other long term (current) drug therapy: Secondary | ICD-10-CM | POA: Diagnosis not present

## 2023-03-07 NOTE — Telephone Encounter (Signed)
I only received one page of questionnaire. I mailed another questionnaire to patient asking her to complete both pages and mail back to [place].

## 2023-03-09 ENCOUNTER — Encounter: Payer: Self-pay | Admitting: Adult Health

## 2023-03-09 ENCOUNTER — Ambulatory Visit: Payer: Medicare PPO | Admitting: Adult Health

## 2023-03-09 VITALS — BP 130/71 | HR 68 | Ht 66.5 in | Wt 194.0 lb

## 2023-03-09 DIAGNOSIS — Z01419 Encounter for gynecological examination (general) (routine) without abnormal findings: Secondary | ICD-10-CM

## 2023-03-09 DIAGNOSIS — Z1211 Encounter for screening for malignant neoplasm of colon: Secondary | ICD-10-CM

## 2023-03-09 DIAGNOSIS — L900 Lichen sclerosus et atrophicus: Secondary | ICD-10-CM | POA: Diagnosis not present

## 2023-03-09 LAB — HEMOCCULT GUIAC POC 1CARD (OFFICE): Fecal Occult Blood, POC: NEGATIVE

## 2023-03-09 NOTE — Progress Notes (Signed)
Patient ID: [name], female   DOB: 11-30-1950, 72 y.o.   MRN: [id number] History of Present Illness: [name] is a 72 year old white female, widowed, PM in for a well woman gyn exam. She says she was getting pedicure and the tech told her to see doctor about her left foot, she has some numbness and scaling of skin.  PCP is Dr Ouida Sills   Current Medications, Allergies, Past Medical History, Past Surgical History, Family History and Social History were reviewed in [facility] electronic medical record.     Review of Systems: Patient denies any headaches, hearing loss, fatigue, blurred vision, shortness of breath, chest pain, abdominal pain, problems with bowel movements, urination, or intercourse(not active). No joint pain or mood swings.  Denies any vaginal bleeding   Physical Exam:BP 130/71 (BP Location: Left Arm, Patient Position: Sitting, Cuff Size: Normal)   Pulse 68   Ht 5' 6.5" (1.689 m)   Wt 194 lb (88 kg)   BMI 30.84 kg/m   General:  Well developed, well nourished, no acute distress Skin:  Warm and dry Neck:  Midline trachea, normal thyroid, good ROM, no lymphadenopathy,no carotid bruits heard  Lungs; Clear to auscultation bilaterally Breast:  No dominant palpable mass, retraction, or nipple discharge Cardiovascular: Regular rate and rhythm Abdomen:  Soft, non tender, no hepatosplenomegaly Pelvic:  External genitalia is normal in appearance, no lesions.  The vagina is pale, area at introitus, not as red or thin(not using temovate). Urethra has no lesions or masses. The cervix is smooth.  Uterus is felt to be normal size, shape, and contour.  No adnexal masses or tenderness noted.Bladder is non tender, no masses felt. Rectal: Good sphincter tone, no polyps, or hemorrhoids felt.  Hemoccult negative. Extremities/musculoskeletal:  No swelling, +spider veins noted, no clubbing or cyanosis, left foot does have some scaling skin, +pulses, toes blanch Psych:  No mood changes, alert  and cooperative,seems happy AA is 0 Fall risk is low    03/09/2023    3:41 PM 03/07/2022    2:11 PM 11/25/2020   11:35 AM  Depression screen PHQ 2/9  Decreased Interest 0 0 0  Down, Depressed, Hopeless 0 0 0  PHQ - 2 Score 0 0 0  Altered sleeping 1 0 0  Tired, decreased energy 0 0 1  Change in appetite 0 0 3  Feeling bad or failure about yourself  0 0 0  Trouble concentrating 0 2 0  Moving slowly or fidgety/restless 0 0 0  Suicidal thoughts 0 0 0  PHQ-9 Score 1 2 4        03/09/2023    3:41 PM 03/07/2022    2:11 PM 11/25/2020   11:35 AM 11/20/2019    1:43 PM  GAD 7 : Generalized Anxiety Score  Nervous, Anxious, on Edge 0 0 0 0  Control/stop worrying 0 0 0 0  Worry too much - different things 0 0 0 0  Trouble relaxing 0 0 0 0  Restless 0 0 0 0  Easily annoyed or irritable 0 0 0 0  Afraid - awful might happen 0 0 0 0  Total GAD 7 Score 0 0 0 0  Anxiety Difficulty    Not difficult at all   Examination chaperoned by Tish RN   Impression and plan: 1. Encounter for well woman exam with routine gynecological exam Physical in 1 year Pap no longer needed Mammogram was negative 02/01/22 Labs with PCP Talk  to Dr Ouida Sills about left foot,  may need to see podiatrist   2. Encounter for screening fecal occult blood testing Hemoccult was negative  - POCT occult blood stool  3. Lichen sclerosus et atrophicus Looks better, has temovate but not using

## 2023-03-14 DIAGNOSIS — I493 Ventricular premature depolarization: Secondary | ICD-10-CM | POA: Diagnosis not present

## 2023-03-14 DIAGNOSIS — I5022 Chronic systolic (congestive) heart failure: Secondary | ICD-10-CM | POA: Diagnosis not present

## 2023-03-14 DIAGNOSIS — D8685 Sarcoid myocarditis: Secondary | ICD-10-CM | POA: Diagnosis not present

## 2023-03-14 DIAGNOSIS — E1122 Type 2 diabetes mellitus with diabetic chronic kidney disease: Secondary | ICD-10-CM | POA: Diagnosis not present

## 2023-03-14 DIAGNOSIS — I10 Essential (primary) hypertension: Secondary | ICD-10-CM | POA: Diagnosis not present

## 2023-03-14 DIAGNOSIS — Z23 Encounter for immunization: Secondary | ICD-10-CM | POA: Diagnosis not present

## 2023-03-14 DIAGNOSIS — Z0001 Encounter for general adult medical examination with abnormal findings: Secondary | ICD-10-CM | POA: Diagnosis not present

## 2023-03-24 ENCOUNTER — Ambulatory Visit: Payer: Medicare PPO | Admitting: Podiatry

## 2023-03-24 ENCOUNTER — Encounter: Payer: Self-pay | Admitting: Podiatry

## 2023-03-24 DIAGNOSIS — E119 Type 2 diabetes mellitus without complications: Secondary | ICD-10-CM | POA: Diagnosis not present

## 2023-03-24 DIAGNOSIS — E1142 Type 2 diabetes mellitus with diabetic polyneuropathy: Secondary | ICD-10-CM

## 2023-03-25 NOTE — Progress Notes (Signed)
Subjective:  Patient ID: [name], female    DOB: [birth year],  MRN: [id number]  Chief Complaint  Patient presents with   Peripheral Neuropathy    Patient is here for foot numbness/ possible neuropathy last year    72 y.o. female presents with concern for numbness and possible neuropathy in bilateral foot.  Patient does have a history of type 2 diabetes.  Not currently taking any medications.  Denies pain just has some numbness.  Presenting for routine DM exam.   Past Medical History:  Diagnosis Date   Atypical nevi 04/01/2014   Glaucoma    Hyperlipidemia    Hypertension    Proximal humerus fracture    Right   Sarcoidosis    Type 2 diabetes mellitus (HCC)    Varicose veins     Allergies  Allergen Reactions   Macrobid [Nitrofurantoin Macrocrystal] Nausea And Vomiting   Codeine Nausea And Vomiting   Sulfa Antibiotics Other (See Comments)    Pt cannot remember   Ciprofloxacin     Decreased BP    ROS: Negative except as per HPI above  Objective:  General: AAO x3, NAD  Dermatological: With inspection and palpation of the right and left lower extremities there are no open sores, no preulcerative lesions, no rash or signs of infection present. Nails are of normal length thickness and coloration.   Vascular:  Dorsalis Pedis artery and Posterior Tibial artery pedal pulses are 2/4 bilateral.  Capillary fill time < 3 sec to all digits.   Neruologic: Grossly intact via light touch and protective sensation and near normal at 8 out of 10 SWM monofilament exam bilateral foot  Musculoskeletal: No gross boney pedal deformities bilateral. No pain, crepitus, or limitation noted with foot and ankle range of motion bilateral. Muscular strength 5/5 in all groups tested bilateral.  Gait: Unassisted, Nonantalgic.   No images are attached to the encounter.   Assessment:   1. DM type 2 with diabetic peripheral neuropathy (HCC)   2. Encounter for comprehensive diabetic foot  examination, type 2 diabetes mellitus (HCC)     Plan:  Patient was evaluated and treated and all questions answered.  # Peripheral neuropathy secondary to type 2 diabetes mellitus, no evidence of peripheral arterial disease -Discussed with patient as he does have some numbness related to her diabetes. -Fortunately she does not have pain associated with this neuropathy therefore I do not recommend any medical management at this time -Continue to control diabetes with diet and exercise to prevent progression of neuropathy -Overall her exam is relatively benign and do not feel she is at significant risk from the neuropathy as there is no callus or preulcerative lesions present on either foot. -Follow-up in 1 year for annual diabetic exam         Corinna Gab, DPM [facility]

## 2023-04-04 ENCOUNTER — Telehealth: Payer: Self-pay | Admitting: Internal Medicine

## 2023-04-04 NOTE — Telephone Encounter (Signed)
I only received the back page of the questionnaire. I left message on VM for the patient that I will mail her another questionnaire and to complete both pages.

## 2023-04-20 ENCOUNTER — Telehealth: Payer: Self-pay | Admitting: Internal Medicine

## 2023-04-20 NOTE — Telephone Encounter (Signed)
Questionnaire in review

## 2023-04-25 NOTE — Telephone Encounter (Signed)
Procedure: Colonoscopy  Height: 5'8 Weight: 188lbs        Have you had a colonoscopy before?  09/27/17, Dr. Jena Gauss  Do you have family history of colon cancer?  Yes brother  Do you have a family history of polyps? no  Previous colonoscopy with polyps removed? no  Do you have a history colorectal cancer?   no  Are you diabetic?  yes  Do you have a prosthetic or mechanical heart valve? no  Do you have a pacemaker/defibrillator?   no  Have you had endocarditis/atrial fibrillation?  no  Do you use supplemental oxygen/CPAP?  no  Have you had joint replacement within the last 12 months?  no  Do you tend to be constipated or have to use laxatives?  no   Do you have history of alcohol use? If yes, how much and how often.  no  Do you have history or are you using drugs? If yes, what do are you  using?  no  Have you ever had a stroke/heart attack?  no  Have you ever had a heart or other vascular stent placed,?no  Do you take weight loss medication? no  female patients,: have you had a hysterectomy? no                              are you post menopausal?  yes                              do you still have your menstrual cycle? no    Date of last menstrual period?   Do you take any blood-thinning medications such as: (Plavix, aspirin, Coumadin, Aggrenox, Brilinta, Xarelto, Eliquis, Pradaxa, Savaysa or Effient)? Aspirin 81mg   If yes we need the name, milligram, dosage and who is prescribing doctor:               Current Outpatient Medications  Medication Sig Dispense Refill   aspirin EC 81 MG tablet Take 81 mg by mouth at bedtime.     brimonidine-timolol (COMBIGAN) 0.2-0.5 % ophthalmic solution INSTILL ONE DROP IN BOTH  EYES TWO TIMES DAILY     calcium-vitamin D (OSCAL WITH D) 500-200 MG-UNIT tablet Take 2 tablets by mouth daily with breakfast. 60 tablet 2   carvedilol (COREG) 6.25 MG tablet Take 6.25 mg by mouth 2 (two) times daily.     dorzolamide (TRUSOPT) 2 % ophthalmic  solution Place 1 drop into both eyes 2 (two) times daily.     ENTRESTO 24-26 MG TAKE 1 TABLET TWICE DAILY 180 tablet 3   folic acid (FOLVITE) 1 MG tablet TAKE 1 TABLET EVERY DAY 90 tablet 3   gabapentin (NEURONTIN) 300 MG capsule Take 300 mg by mouth daily.     glimepiride (AMARYL) 1 MG tablet Take 1 mg by mouth daily before breakfast.     JARDIANCE 10 MG TABS tablet TAKE 1 TABLET AT BEDTIME (DOSE CHANGE) 90 tablet 3   latanoprost (XALATAN) 0.005 % ophthalmic solution Place 1 drop into both eyes 2 (two) times daily.     Magnesium 400 MG TABS Take 400 mg by mouth daily. 90 tablet 3   metFORMIN (GLUCOPHAGE) 1000 MG tablet Take 1,000 mg by mouth 2 (two) times daily.     methotrexate (RHEUMATREX) 2.5 MG tablet Take 20 mg by mouth once a week. Caution:Chemotherapy. Protect from light. 8 tablets  Multiple Vitamin (MULTI-VITAMIN) tablet Take 1 tablet by mouth.     pantoprazole (PROTONIX) 40 MG tablet TAKE 1 TABLET EVERY EVENING 90 tablet 3   spironolactone (ALDACTONE) 25 MG tablet TAKE 1 TABLET AT BEDTIME (DOSE CHANGE) 90 tablet 3   No current facility-administered medications for this visit.    Allergies  Allergen Reactions   Macrobid [Nitrofurantoin Macrocrystal] Nausea And Vomiting   Codeine Nausea And Vomiting   Sulfa Antibiotics Other (See Comments)    Pt cannot remember   Ciprofloxacin     Decreased BP

## 2023-04-26 NOTE — Telephone Encounter (Signed)
Asa 3. Needs ov with app.

## 2023-04-27 NOTE — Telephone Encounter (Signed)
Questionnaire from recall, no referral needed  

## 2023-05-02 ENCOUNTER — Encounter: Payer: Self-pay | Admitting: Gastroenterology

## 2023-05-02 ENCOUNTER — Ambulatory Visit: Payer: Medicare PPO | Admitting: Gastroenterology

## 2023-05-02 VITALS — BP 110/72 | HR 71 | Temp 97.6°F | Ht 67.0 in | Wt 196.2 lb

## 2023-05-02 DIAGNOSIS — Z7189 Other specified counseling: Secondary | ICD-10-CM

## 2023-05-02 DIAGNOSIS — Z86718 Personal history of other venous thrombosis and embolism: Secondary | ICD-10-CM

## 2023-05-02 DIAGNOSIS — Z800 Family history of malignant neoplasm of digestive organs: Secondary | ICD-10-CM | POA: Diagnosis not present

## 2023-05-02 DIAGNOSIS — Z1211 Encounter for screening for malignant neoplasm of colon: Secondary | ICD-10-CM

## 2023-05-02 NOTE — Patient Instructions (Signed)
Colonoscopy to be scheduled.  Separate instructions will be provided. You will need to hold Jardiance for 72 hours prior to your procedure.

## 2023-05-02 NOTE — Progress Notes (Addendum)
GI Office Note    Referring Provider: Carylon Perches, MD Primary Care Physician:  Carylon Perches, MD  Primary Gastroenterologist: Roetta Sessions, MD   Chief Complaint   Chief Complaint  Patient presents with   Colonoscopy     History of Present Illness   [name] is a 72 y.o. female presenting today to schedule 5-year high risk screening colonoscopy for family history of colon cancer (brother).  Patient has a history of pulmonary sarcoid, hypertension, hyperlipidemia, type 2 diabetes mellitus, nonischemic cardiomyopathy suspected in the setting of cardiac sarcoid with recently reduced EF of 25 to 30% in September 2022 but most recent echo May 2024 with a EF of 55 to 60%.  She has a history of frequent PACs and PVCs on ZIO earlier this year.  From a GI standpoint she is doing well.  She has no constipation, diarrhea, melena, rectal bleeding, abdominal pain, upper GI symptoms.    Colonoscopy 09/2017: -abnormal ileocecal valve uncertain signficiance s/p bx (benign) -repeat colonoscopy in 5 years   Medications   Current Outpatient Medications  Medication Sig Dispense Refill   ACCU-CHEK GUIDE test strip daily.     aspirin EC 81 MG tablet Take 81 mg by mouth at bedtime.     brimonidine-timolol (COMBIGAN) 0.2-0.5 % ophthalmic solution INSTILL ONE DROP IN BOTH  EYES TWO TIMES DAILY     calcium-vitamin D (OSCAL WITH D) 500-200 MG-UNIT tablet Take 2 tablets by mouth daily with breakfast. 60 tablet 2   carvedilol (COREG) 6.25 MG tablet Take 6.25 mg by mouth 2 (two) times daily.     dorzolamide (TRUSOPT) 2 % ophthalmic solution Place 1 drop into both eyes 2 (two) times daily.     ENTRESTO 24-26 MG TAKE 1 TABLET TWICE DAILY 180 tablet 3   folic acid (FOLVITE) 1 MG tablet TAKE 1 TABLET EVERY DAY 90 tablet 3   gabapentin (NEURONTIN) 300 MG capsule Take 300 mg by mouth daily.     glimepiride (AMARYL) 1 MG tablet Take 1 mg by mouth daily before breakfast.     JARDIANCE 10 MG TABS tablet TAKE  1 TABLET AT BEDTIME (DOSE CHANGE) 90 tablet 3   latanoprost (XALATAN) 0.005 % ophthalmic solution Place 1 drop into both eyes 2 (two) times daily.     Magnesium 400 MG TABS Take 400 mg by mouth daily. 90 tablet 3   metFORMIN (GLUCOPHAGE) 1000 MG tablet Take 1,000 mg by mouth 2 (two) times daily.     methotrexate (RHEUMATREX) 2.5 MG tablet Take 20 mg by mouth once a week. Caution:Chemotherapy. Protect from light. 8 tablets     Multiple Vitamin (MULTI-VITAMIN) tablet Take 1 tablet by mouth.     pantoprazole (PROTONIX) 40 MG tablet TAKE 1 TABLET EVERY EVENING 90 tablet 3   spironolactone (ALDACTONE) 25 MG tablet TAKE 1 TABLET AT BEDTIME (DOSE CHANGE) 90 tablet 3   No current facility-administered medications for this visit.    Allergies   Allergies as of 05/02/2023 - Review Complete 04/25/2023  Allergen Reaction Noted   Macrobid [nitrofurantoin macrocrystal] Nausea And Vomiting 06/28/2012   Codeine Nausea And Vomiting 06/28/2012   Sulfa antibiotics Other (See Comments) 06/28/2012   Ciprofloxacin  07/23/2012    Past Medical History   Past Medical History:  Diagnosis Date   Atypical nevi 04/01/2014   Glaucoma    Hyperlipidemia    Hypertension    Nonischemic cardiomyopathy (HCC)    Proximal humerus fracture    Right  Sarcoidosis    Type 2 diabetes mellitus (HCC)    Varicose veins     Past Surgical History   Past Surgical History:  Procedure Laterality Date   APPENDECTOMY     BIOPSY  09/27/2017   Procedure: BIOPSY;  Surgeon: Corbin Ade, MD;  Location: [facility];  Service: Endoscopy;;  ileocecal valve   CESAREAN SECTION     X 3   COLONOSCOPY     COLONOSCOPY  07/23/2012   Procedure: COLONOSCOPY;  Surgeon: Corbin Ade, MD;  Location: [facility];  Service: Endoscopy;  Laterality: N/A;  7:30 AM   COLONOSCOPY N/A 09/27/2017   Procedure: COLONOSCOPY;  Surgeon: Corbin Ade, MD;  Location: [facility];  Service: Endoscopy;  Laterality: N/A;  8:30   RIGHT/LEFT  HEART CATH AND CORONARY ANGIOGRAPHY N/A 03/25/2021   Procedure: RIGHT/LEFT HEART CATH AND CORONARY ANGIOGRAPHY;  Surgeon: Runell Gess, MD;  Location: [facility];  Service: Cardiovascular;  Laterality: N/A;   TONSILLECTOMY     TUBAL LIGATION      Past Family History   Family History  Problem Relation Age of Onset   Varicose Veins Mother    Deep vein thrombosis Mother    Cancer Father    Varicose Veins Father    Deep vein thrombosis Father    Diabetes Father    Heart disease Father    Hypertension Father    Diabetes Sister    Heart disease Sister    Hyperlipidemia Sister    Hypertension Sister    Hypertension Son    Colon cancer Brother    Cancer Brother        stomach   Varicose Veins Daughter    Hypertension Son    Miscarriages / [place] Daughter     Past Social History   Social History   Socioeconomic History   Marital status: Widowed    Spouse name: Not on file   Number of children: Not on file   Years of education: Not on file   Highest education level: Not on file  Occupational History   Not on file  Tobacco Use   Smoking status: Never   Smokeless tobacco: Never  Vaping Use   Vaping status: Never Used  Substance and Sexual Activity   Alcohol use: No   Drug use: No   Sexual activity: Not Currently    Birth control/protection: Post-menopausal, Surgical, Abstinence    Comment: tubal  Other Topics Concern   Not on file  Social History Narrative   Not on file   Social Determinants of Health   Financial Resource Strain: Low Risk  (03/09/2023)   Overall Financial Resource Strain (CARDIA)    Difficulty of Paying Living Expenses: Not hard at all  Food Insecurity: No Food Insecurity (03/09/2023)   Hunger Vital Sign    Worried About Running Out of Food in the Last Year: Never true    Ran Out of Food in the Last Year: Never true  Transportation Needs: No Transportation Needs (03/09/2023)   PRAPARE - Administrator, Civil Service  (Medical): No    Lack of Transportation (Non-Medical): No  Physical Activity: Insufficiently Active (03/09/2023)   Exercise Vital Sign    Days of Exercise per Week: 1 day    Minutes of Exercise per Session: 30 min  Stress: No Stress Concern Present (03/09/2023)   [employer] of Occupational Health - Occupational Stress Questionnaire    Feeling of Stress : Not  at all  Social Connections: Moderately Integrated (03/09/2023)   Social Connection and Isolation Panel [NHANES]    Frequency of Communication with Friends and Family: More than three times a week    Frequency of Social Gatherings with Friends and Family: More than three times a week    Attends Religious Services: More than 4 times per year    Active Member of [employer] or Organizations: Yes    Attends Banker Meetings: More than 4 times per year    Marital Status: Widowed  Intimate Partner Violence: Not At Risk (03/09/2023)   Humiliation, Afraid, Rape, and Kick questionnaire    Fear of Current or Ex-Partner: No    Emotionally Abused: No    Physically Abused: No    Sexually Abused: No    Review of Systems   General: Negative for anorexia, weight loss, fever, chills, fatigue, weakness. Eyes: Negative for vision changes.  ENT: Negative for hoarseness, difficulty swallowing , nasal congestion. CV: Negative for chest pain, angina, palpitations, dyspnea on exertion, peripheral edema.  Respiratory: Negative for dyspnea at rest, dyspnea on exertion, cough, sputum, wheezing.  GI: See history of present illness. GU:  Negative for dysuria, hematuria, urinary incontinence, urinary frequency, nocturnal urination.  MS: Negative for joint pain, low back pain.  Derm: Negative for rash or itching.  Neuro: Negative for weakness, abnormal sensation, seizure, frequent headaches, memory loss,  confusion.  Psych: Negative for anxiety, depression, suicidal ideation, hallucinations.  Endo: Negative for unusual weight change.  Heme:  Negative for bruising or bleeding. Allergy: Negative for rash or hives.  Physical Exam   BP 110/72 (BP Location: Right Arm, Patient Position: Sitting, Cuff Size: Normal)   Pulse 71   Temp 97.6 F (36.4 C) (Oral)   Ht 5\' 7"  (1.702 m)   Wt 196 lb 3.2 oz (89 kg)   SpO2 99%   BMI 30.73 kg/m    General: Well-nourished, well-developed in no acute distress.  Head: Normocephalic, atraumatic.   Eyes: Conjunctiva pink, no icterus. Mouth: Oropharyngeal mucosa moist and pink Neck: Supple without thyromegaly, masses, or lymphadenopathy.  Lungs: Clear to auscultation bilaterally.  Heart: Regular rate and rhythm, no murmurs rubs or gallops.  Abdomen: Bowel sounds are normal, nontender, nondistended, no hepatosplenomegaly or masses,  no abdominal bruits or hernia, no rebound or guarding.   Rectal: Not performed Extremities: No lower extremity edema. No clubbing or deformities.  Neuro: Alert and oriented x 4 , grossly normal neurologically.  Skin: Warm and dry, no rash or jaundice.   Psych: Alert and cooperative, normal mood and affect.  Labs   Lab Results  Component Value Date   NA 139 10/26/2022   CL 104 10/26/2022   K 4.4 10/26/2022   CO2 27 10/26/2022   BUN 13 10/26/2022   CREATININE 0.82 10/26/2022   GFRNONAA >60 10/26/2022   CALCIUM 9.5 10/26/2022   ALBUMIN 3.9 10/26/2022   GLUCOSE 109 (H) 10/26/2022     Imaging Studies   No results found.  Assessment/Plan:   High risk colon cancer screening -Brother succumbed to colon cancer -colonoscopy in near future. ASA 3.  I have discussed the risks, alternatives, benefits with regards to but not limited to the risk of reaction to medication, bleeding, infection, perforation and the patient is agreeable to proceed. Written consent to be obtained.      Leanna Battles. Melvyn Neth, MHS, PA-C [facility]

## 2023-05-15 ENCOUNTER — Encounter: Payer: Self-pay | Admitting: *Deleted

## 2023-05-18 ENCOUNTER — Encounter: Payer: Self-pay | Admitting: Neurology

## 2023-05-18 ENCOUNTER — Ambulatory Visit: Payer: Medicare PPO | Admitting: Neurology

## 2023-05-18 VITALS — BP 111/69 | HR 76 | Ht 67.0 in | Wt 191.0 lb

## 2023-05-18 DIAGNOSIS — R4189 Other symptoms and signs involving cognitive functions and awareness: Secondary | ICD-10-CM | POA: Diagnosis not present

## 2023-05-18 NOTE — Progress Notes (Signed)
[facility]  PATIENT: [name] DOB: 1950-08-26  REQUESTING CLINICIAN: Carylon Perches, MD HISTORY FROM: Patient and sister  REASON FOR VISIT: Memory loss    HISTORICAL  CHIEF COMPLAINT:  Chief Complaint  Patient presents with   New Patient (Initial Visit)    Rm 13. With sister carolyn, patient concerned with memory. Reports talking about it for 6 months now    HISTORY OF PRESENT ILLNESS:  This is 72 year old woman past medical history hypertension, hyperlipidemia, diabetes mellitus, who is presenting with memory loss for the past 6 months.  Patient feels like she is not as sharp as previously.  She has difficulty with recent conversation, sometimes misplaced items and sometimes comes into a room but does not remember the reason why.  This has been going on for the past 6 months.  She does live alone, and she is independent in all activities of daily living.  She presents today with her sister who tells me that she feels like patient is doing very well.  Of note patient lost her mother-in-law late last year, she was very close to her.  After her death, she was dealing with her estate planning and this has been very stressful.  She is still dealing with the estate planning.  Her sister believes this additional stress is affecting in her memory.   TBI:   No past history of TBI Stroke:   no past history of stroke Seizures:   no past history of seizures Sleep:   no history of sleep apnea.  Mood:   patient denies anxiety and depression. But she has been dealing with estate planning after death of mother in law. She was very close to her  Family history of Dementia: Aunt Denies  Functional status: independent in all ADLs and IADLs Patient lives alone. Cooking: no issues Cleaning: no issues Shopping: no issues Bathing: no issues Toileting: no issues Driving: no issues Bills: no issues  Medications: no issues Ever left the stove on by accident?: denies Forget  how to use items around the house?: denies Getting lost going to familiar places?: denies Forgetting loved ones names?: denies Word finding difficulty? denies Sleep: denies   OTHER MEDICAL CONDITIONS: Hypertension, Hyperlipidemia, Diabetes, Glaucoma    REVIEW OF SYSTEMS: Full 14 system review of systems performed and negative with exception of: As noted in the HPI   ALLERGIES: Allergies  Allergen Reactions   Macrobid [Nitrofurantoin Macrocrystal] Nausea And Vomiting   Codeine Nausea And Vomiting   Sulfa Antibiotics Other (See Comments)    Pt cannot remember   Cefaclor Other (See Comments)    Reaction: Cardiovascular Arrest (ALLERGY/intolerance)   Ciprofloxacin     Decreased BP    HOME MEDICATIONS: Outpatient Medications Prior to Visit  Medication Sig Dispense Refill   ACCU-CHEK GUIDE test strip daily.     aspirin EC 81 MG tablet Take 81 mg by mouth at bedtime.     brimonidine-timolol (COMBIGAN) 0.2-0.5 % ophthalmic solution INSTILL ONE DROP IN BOTH  EYES TWO TIMES DAILY     calcium-vitamin D (OSCAL WITH D) 500-200 MG-UNIT tablet Take 2 tablets by mouth daily with breakfast. 60 tablet 2   carvedilol (COREG) 6.25 MG tablet Take 6.25 mg by mouth 2 (two) times daily.     dorzolamide (TRUSOPT) 2 % ophthalmic solution Place 1 drop into both eyes 2 (two) times daily.     ENTRESTO 24-26 MG TAKE 1 TABLET TWICE DAILY 180 tablet 3   folic acid (FOLVITE) 1  MG tablet TAKE 1 TABLET EVERY DAY 90 tablet 3   gabapentin (NEURONTIN) 300 MG capsule Take 300 mg by mouth daily.     glimepiride (AMARYL) 1 MG tablet Take 1 mg by mouth daily before breakfast.     JARDIANCE 10 MG TABS tablet TAKE 1 TABLET AT BEDTIME (DOSE CHANGE) 90 tablet 3   latanoprost (XALATAN) 0.005 % ophthalmic solution Place 1 drop into both eyes 2 (two) times daily.     Magnesium 400 MG TABS Take 400 mg by mouth daily. 90 tablet 3   metFORMIN (GLUCOPHAGE) 1000 MG tablet Take 1,000 mg by mouth 2 (two) times daily.      methotrexate (RHEUMATREX) 2.5 MG tablet Take 20 mg by mouth once a week. Caution:Chemotherapy. Protect from light. 8 tablets     Multiple Vitamin (MULTI-VITAMIN) tablet Take 1 tablet by mouth.     pantoprazole (PROTONIX) 40 MG tablet TAKE 1 TABLET EVERY EVENING 90 tablet 3   spironolactone (ALDACTONE) 25 MG tablet TAKE 1 TABLET AT BEDTIME (DOSE CHANGE) 90 tablet 3   No facility-administered medications prior to visit.    PAST MEDICAL HISTORY: Past Medical History:  Diagnosis Date   Atypical nevi 04/01/2014   Glaucoma    Hyperlipidemia    Hypertension    Nonischemic cardiomyopathy (HCC)    Proximal humerus fracture    Right   Sarcoidosis    Type 2 diabetes mellitus (HCC)    Varicose veins     PAST SURGICAL HISTORY: Past Surgical History:  Procedure Laterality Date   APPENDECTOMY     BIOPSY  09/27/2017   Procedure: BIOPSY;  Surgeon: Corbin Ade, MD;  Location: [facility];  Service: Endoscopy;;  ileocecal valve   CESAREAN SECTION     X 3   COLONOSCOPY     COLONOSCOPY  07/23/2012   Procedure: COLONOSCOPY;  Surgeon: Corbin Ade, MD;  Location: [facility];  Service: Endoscopy;  Laterality: N/A;  7:30 AM   COLONOSCOPY N/A 09/27/2017   Procedure: COLONOSCOPY;  Surgeon: Corbin Ade, MD;  Location: [facility];  Service: Endoscopy;  Laterality: N/A;  8:30   RIGHT/LEFT HEART CATH AND CORONARY ANGIOGRAPHY N/A 03/25/2021   Procedure: RIGHT/LEFT HEART CATH AND CORONARY ANGIOGRAPHY;  Surgeon: Runell Gess, MD;  Location: [facility];  Service: Cardiovascular;  Laterality: N/A;   TONSILLECTOMY     TUBAL LIGATION      FAMILY HISTORY: Family History  Problem Relation Age of Onset   Varicose Veins Mother    Deep vein thrombosis Mother    Cancer Father    Varicose Veins Father    Deep vein thrombosis Father    Diabetes Father    Heart disease Father    Hypertension Father    Diabetes Sister    Heart disease Sister    Hyperlipidemia Sister    Hypertension  Sister    Hypertension Son    Colon cancer Brother    Cancer Brother        stomach   Varicose Veins Daughter    Hypertension Son    Miscarriages / [place] Daughter     SOCIAL HISTORY: Social History   Socioeconomic History   Marital status: Widowed    Spouse name: Not on file   Number of children: Not on file   Years of education: Not on file   Highest education level: Not on file  Occupational History   Not on file  Tobacco Use   Smoking status:  Never   Smokeless tobacco: Never  Vaping Use   Vaping status: Never Used  Substance and Sexual Activity   Alcohol use: No   Drug use: No   Sexual activity: Not Currently    Birth control/protection: Post-menopausal, Surgical, Abstinence    Comment: tubal  Other Topics Concern   Not on file  Social History Narrative   Not on file   Social Determinants of Health   Financial Resource Strain: Low Risk  (03/09/2023)   Overall Financial Resource Strain (CARDIA)    Difficulty of Paying Living Expenses: Not hard at all  Food Insecurity: No Food Insecurity (03/09/2023)   Hunger Vital Sign    Worried About Running Out of Food in the Last Year: Never true    Ran Out of Food in the Last Year: Never true  Transportation Needs: No Transportation Needs (03/09/2023)   PRAPARE - Administrator, Civil Service (Medical): No    Lack of Transportation (Non-Medical): No  Physical Activity: Insufficiently Active (03/09/2023)   Exercise Vital Sign    Days of Exercise per Week: 1 day    Minutes of Exercise per Session: 30 min  Stress: No Stress Concern Present (03/09/2023)   [employer] of Occupational Health - Occupational Stress Questionnaire    Feeling of Stress : Not at all  Social Connections: Moderately Integrated (03/09/2023)   Social Connection and Isolation Panel [NHANES]    Frequency of Communication with Friends and Family: More than three times a week    Frequency of Social Gatherings with Friends and Family:  More than three times a week    Attends Religious Services: More than 4 times per year    Active Member of [employer] or Organizations: Yes    Attends Banker Meetings: More than 4 times per year    Marital Status: Widowed  Intimate Partner Violence: Not At Risk (03/09/2023)   Humiliation, Afraid, Rape, and Kick questionnaire    Fear of Current or Ex-Partner: No    Emotionally Abused: No    Physically Abused: No    Sexually Abused: No    PHYSICAL EXAM  GENERAL EXAM/CONSTITUTIONAL: Vitals:  Vitals:   05/18/23 0839  BP: 111/69  Pulse: 76  Weight: 191 lb (86.6 kg)  Height: 5\' 7"  (1.702 m)   Body mass index is 29.91 kg/m. Wt Readings from Last 3 Encounters:  05/18/23 191 lb (86.6 kg)  05/02/23 196 lb 3.2 oz (89 kg)  03/09/23 194 lb (88 kg)   Patient is in no distress; well developed, nourished and groomed; neck is supple  MUSCULOSKELETAL: Gait, strength, tone, movements noted in Neurologic exam below  NEUROLOGIC: MENTAL STATUS:      No data to display         awake, alert, oriented to person, place and time recent and remote memory intact normal attention and concentration language fluent, comprehension intact, naming intact fund of knowledge appropriate     05/18/2023    8:42 AM  Montreal Cognitive Assessment   Visuospatial/ Executive (0/5) 5  Naming (0/3) 3  Attention: Read list of digits (0/2) 2  Attention: Read list of letters (0/1) 1  Attention: Serial 7 subtraction starting at 100 (0/3) 3  Language: Repeat phrase (0/2) 2  Language : Fluency (0/1) 0  Abstraction (0/2) 1  Delayed Recall (0/5) 4  Orientation (0/6) 6  Total 27  Adjusted Score (based on education) 27     CRANIAL NERVE:  2nd, 3rd, 4th, 6th- visual fields  full to confrontation, extraocular muscles intact, no nystagmus 5th - facial sensation symmetric 7th - facial strength symmetric 8th - hearing intact 9th - palate elevates symmetrically, uvula midline 11th - shoulder shrug  symmetric 12th - tongue protrusion midline  MOTOR:  normal bulk and tone, full strength in the BUE, BLE  SENSORY:  normal and symmetric to light touch  COORDINATION:  finger-nose-finger, fine finger movements normal  GAIT/STATION:  normal   DIAGNOSTIC DATA (LABS, IMAGING, TESTING) - I reviewed patient records, labs, notes, testing and imaging myself where available.  Lab Results  Component Value Date   WBC 5.5 10/26/2022   HGB 13.0 10/26/2022   HCT 38.2 10/26/2022   MCV 95.0 10/26/2022   PLT 130 (L) 10/26/2022      Component Value Date/Time   NA 139 10/26/2022 1158   K 4.4 10/26/2022 1158   CL 104 10/26/2022 1158   CO2 27 10/26/2022 1158   GLUCOSE 109 (H) 10/26/2022 1158   BUN 13 10/26/2022 1158   CREATININE 0.82 10/26/2022 1158   CALCIUM 9.5 10/26/2022 1158   PROT 6.3 (L) 10/26/2022 1158   ALBUMIN 3.9 10/26/2022 1158   AST 21 10/26/2022 1158   ALT 19 10/26/2022 1158   ALKPHOS 40 10/26/2022 1158   BILITOT 1.3 (H) 10/26/2022 1158   GFRNONAA >60 10/26/2022 1158   GFRAA >60 08/06/2019 1430   Lab Results  Component Value Date   CHOL 98 03/25/2021   HDL 32 (L) 03/25/2021   LDLCALC 31 03/25/2021   TRIG 175 (H) 03/25/2021   CHOLHDL 3.1 03/25/2021   Lab Results  Component Value Date   HGBA1C 6.1 (H) 03/25/2021   No results found for: "VITAMINB12" Lab Results  Component Value Date   TSH 1.342 04/20/2021    MRI Brain 01/24/2022 No evidence of acute intracranial abnormality Minimal chronic small vessel ischemic changes within the cerebral white matter. Mild generalized cerebral atrophy    ASSESSMENT AND PLAN  72 y.o. year old female with history of hypertension, hyperlipidemia, diabetes mellitus, glaucoma who is presenting with complaint of memory loss, described as not able to recall recent conversation.  She is independent in all activities of daily living.  Lives alone, and her sister believes that she is doing well.  Her sister has not noticed up any  loss in patient memory.  There have been some increase stress related to the death of her mother-in-law and patient dealing with her estate planning which can cause some of her subjective memory complaint.  She has a normal MoCA, 27/30, her most recent brain MRI did not show any acute abnormality.  I have informed patient that objectively, she is doing well with no evidence of memory loss and she is independent in all ADLs and IADLs.  Advised her to continue following up with PCP, they can repeat the [facility] or MMSE test yearly and if there is additional deficit she can return.  I have also advised her to seek counseling regarding the loss of her mother-in-law and the stress associated with dealing with her estate planning.  She voiced understanding.  Return sooner if worse.   1. Subjective memory complaints      Patient Instructions  Continue current medications Continue to follow with PCP Discussed ways of decreasing the risk of dementia including exercise, good sleep, good diet and maintaining a good health. Follow-up as needed.  There are well-accepted and sensible ways to reduce risk for Alzheimers disease and other degenerative brain disorders .  Exercise Daily  Walk A daily 20 minute walk should be part of your routine. Disease related apathy can be a significant roadblock to exercise and the only way to overcome this is to make it a daily routine and perhaps have a reward at the end (something your loved one loves to eat or drink perhaps) or a personal trainer coming to the home can also be very useful. Most importantly, the patient is much more likely to exercise if the caregiver / spouse does it with him/her. In general a structured, repetitive schedule is best.  General Health: Any diseases which effect your body will effect your brain such as a pneumonia, urinary infection, blood clot, heart attack or stroke. Keep contact with your primary care doctor for regular follow ups.  Sleep. A good  nights sleep is healthy for the brain. Seven hours is recommended. If you have insomnia or poor sleep habits we can give you some instructions. If you have sleep apnea wear your mask.  Diet: Eating a heart healthy diet is also a good idea; fish and poultry instead of red meat, nuts (mostly non-peanuts), vegetables, fruits, olive oil or canola oil (instead of butter), minimal salt (use other spices to flavor foods), whole grain rice, bread, cereal and pasta and wine in moderation.Research is now showing that the MIND diet, which is a combination of The Mediterranean diet and the DASH diet, is beneficial for cognitive processing and longevity. Information about this diet can be found in The MIND Diet, a book by Alonna Minium, MS, RDN, and online at [employer].es  Finances, Power of [address] and Advance Directives: You should consider putting legal safeguards in place with regard to financial and medical decision making. While the spouse always has power of attorney for medical and financial issues in the absence of any form, you should consider what you want in case the spouse / caregiver is no longer around or capable of making decisions.   No orders of the defined types were placed in this encounter.   No orders of the defined types were placed in this encounter.   Return if symptoms worsen or fail to improve.  I have spent a total of 65 minutes dedicated to this patient today, preparing to see patient, performing a medically appropriate examination and evaluation, ordering tests and/or medications and procedures, and counseling and educating the patient/family/caregiver; independently interpreting result and communicating results to the family/patient/caregiver; and documenting clinical information in the electronic medical record.   Windell Norfolk, MD 05/18/2023, 10:44 AM  [facility] [address] [place], [place] [address] [phone]

## 2023-05-18 NOTE — Patient Instructions (Signed)
Continue current medications Continue to follow with PCP Discussed ways of decreasing the risk of dementia including exercise, good sleep, good diet and maintaining a good health. Follow-up as needed.  There are well-accepted and sensible ways to reduce risk for Alzheimers disease and other degenerative brain disorders .  Exercise Daily Walk A daily 20 minute walk should be part of your routine. Disease related apathy can be a significant roadblock to exercise and the only way to overcome this is to make it a daily routine and perhaps have a reward at the end (something your loved one loves to eat or drink perhaps) or a personal trainer coming to the home can also be very useful. Most importantly, the patient is much more likely to exercise if the caregiver / spouse does it with him/her. In general a structured, repetitive schedule is best.  General Health: Any diseases which effect your body will effect your brain such as a pneumonia, urinary infection, blood clot, heart attack or stroke. Keep contact with your primary care doctor for regular follow ups.  Sleep. A good nights sleep is healthy for the brain. Seven hours is recommended. If you have insomnia or poor sleep habits we can give you some instructions. If you have sleep apnea wear your mask.  Diet: Eating a heart healthy diet is also a good idea; fish and poultry instead of red meat, nuts (mostly non-peanuts), vegetables, fruits, olive oil or canola oil (instead of butter), minimal salt (use other spices to flavor foods), whole grain rice, bread, cereal and pasta and wine in moderation.Research is now showing that the MIND diet, which is a combination of The Mediterranean diet and the DASH diet, is beneficial for cognitive processing and longevity. Information about this diet can be found in The MIND Diet, a book by Alonna Minium, MS, RDN, and online at [employer]  Finances, Power of [address] and Advance  Directives: You should consider putting legal safeguards in place with regard to financial and medical decision making. While the spouse always has power of attorney for medical and financial issues in the absence of any form, you should consider what you want in case the spouse / caregiver is no longer around or capable of making decisions.

## 2023-05-30 ENCOUNTER — Telehealth: Payer: Self-pay | Admitting: *Deleted

## 2023-05-30 ENCOUNTER — Other Ambulatory Visit: Payer: Self-pay | Admitting: *Deleted

## 2023-05-30 ENCOUNTER — Encounter: Payer: Self-pay | Admitting: *Deleted

## 2023-05-30 MED ORDER — PEG 3350-KCL-NA BICARB-NACL 420 G PO SOLR: 4000.0000 mL | Freq: Once | ORAL | 0 refills | Status: AC

## 2023-05-30 NOTE — Telephone Encounter (Signed)
[facility]  [facility]SA 3 Dr.Rourk

## 2023-05-30 NOTE — Telephone Encounter (Signed)
Pt has been scheduled for 07/25/22. Instructions mailed and prep sent to the pharmacy.   [employer] PA: Approved Authorization #[id number]  Tracking #[id number] Dates of service 07/26/2023 - 09/20/2023

## 2023-05-31 ENCOUNTER — Encounter: Payer: Self-pay | Admitting: *Deleted

## 2023-06-01 ENCOUNTER — Other Ambulatory Visit (HOSPITAL_COMMUNITY): Payer: Self-pay | Admitting: Cardiology

## 2023-06-14 ENCOUNTER — Ambulatory Visit: Payer: Medicare PPO | Admitting: Neurology

## 2023-06-15 ENCOUNTER — Other Ambulatory Visit (HOSPITAL_COMMUNITY): Payer: Self-pay | Admitting: Cardiology

## 2023-06-16 MED ORDER — CARVEDILOL 6.25 MG PO TABS: 6.2500 mg | ORAL_TABLET | Freq: Two times a day (BID) | ORAL | 3 refills | Status: DC

## 2023-06-16 MED ORDER — METHOTREXATE SODIUM 2.5 MG PO TABS: 20.0000 mg | ORAL_TABLET | ORAL | 0 refills | Status: DC

## 2023-06-20 DIAGNOSIS — E1129 Type 2 diabetes mellitus with other diabetic kidney complication: Secondary | ICD-10-CM | POA: Diagnosis not present

## 2023-06-27 DIAGNOSIS — R413 Other amnesia: Secondary | ICD-10-CM | POA: Diagnosis not present

## 2023-06-27 DIAGNOSIS — I5022 Chronic systolic (congestive) heart failure: Secondary | ICD-10-CM | POA: Diagnosis not present

## 2023-06-27 DIAGNOSIS — G629 Polyneuropathy, unspecified: Secondary | ICD-10-CM | POA: Diagnosis not present

## 2023-06-27 DIAGNOSIS — E1122 Type 2 diabetes mellitus with diabetic chronic kidney disease: Secondary | ICD-10-CM | POA: Diagnosis not present

## 2023-07-17 ENCOUNTER — Ambulatory Visit: Payer: Medicare PPO | Admitting: Podiatry

## 2023-07-17 ENCOUNTER — Encounter: Payer: Self-pay | Admitting: Podiatry

## 2023-07-17 DIAGNOSIS — D169 Benign neoplasm of bone and articular cartilage, unspecified: Secondary | ICD-10-CM

## 2023-07-17 DIAGNOSIS — E1142 Type 2 diabetes mellitus with diabetic polyneuropathy: Secondary | ICD-10-CM | POA: Diagnosis not present

## 2023-07-18 NOTE — Progress Notes (Signed)
Subjective:   Patient ID: Collins Scotland, female   DOB: 73 y.o.   MRN: [id number]   HPI Patient presents several problems concerned about a thick growth on the second digit left that she has worked on herself diabetes and dry generalized skin left over right   ROS      Objective:  Physical Exam  Neurovascular status unchanged from visit of last year with the patient found to have small keratotic lesion distal medial aspect digit to left with flaky like skin left over right that she works on herself     Assessment:  Possibility for small bone spur left versus soft tissue issue or relationship to skin structure     Plan:  H&P education rendered do not recommend using any medicated type padding and to use only devices which are not sharp.  It is not pathological now so we will just keep an eye on it and I do not recommend any changes in treatment for skin no breakdown in skin was noted

## 2023-07-21 NOTE — Patient Instructions (Signed)
[name]  07/21/2023     @PREFPERIOPPHARMACY @   Your procedure is scheduled on  07/26/2023.   [facility] at  [phone]  A.M.   Call this number if you have problems the morning of surgery:  [phone]  If you experience any cold or flu symptoms such as cough, fever, chills, shortness of breath, etc. between now and your scheduled surgery, please notify us at the above number.   Remember:  Follow the diet and prep instructions given to you by the office.   You may drink clear liquids until 0415 am on 07/26/2023.    Clear liquids allowed are:                    Water, Juice (No red color; non-citric and without pulp; diabetics please choose diet or no sugar options), Carbonated beverages (diabetics please choose diet or no sugar options), Clear Tea (No creamer, milk, or cream, including half & half and powdered creamer), Black Coffee Only (No creamer, milk or cream, including half & half and powdered creamer), and Clear Sports drink (No red color; diabetics please choose diet or no sugar options)    Take these medicines the morning of surgery with A SIP OF WATER              carvedilol, entresto, gabapentin, pantoprazole.     Do not wear jewelry, make-up or nail polish, including gel polish,  artificial nails, or any other type of covering on natural nails (fingers and  toes).  Do not wear lotions, powders, or perfumes, or deodorant.  Do not shave 48 hours prior to surgery.  Men may shave face and neck.  Do not bring valuables to the hospital.  [facility] is not responsible for any belongings or valuables.  Contacts, dentures or bridgework may not be worn into surgery.  Leave your suitcase in the car.  After surgery it may be brought to your room.  For patients admitted to the hospital, discharge time will be determined by your treatment team.  Patients discharged the day of surgery will not be allowed to drive home and must have someone with them for 24  hours.    Special instructions:   DO NOT smoke tobacco or vape for 24 hours before your procedure.  Please read over the following fact sheets that you were given. Anesthesia Post-op Instructions and Care and Recovery After Surgery       Colonoscopy, Adult, Care After The following information offers guidance on how to care for yourself after your procedure. Your health care provider may also give you more specific instructions. If you have problems or questions, contact your health care provider. What can I expect after the procedure? After the procedure, it is common to have: A small amount of blood in your stool for 24 hours after the procedure. Some gas. Mild cramping or bloating of your abdomen. Follow these instructions at home: Eating and drinking  Drink enough fluid to keep your urine pale yellow. Follow instructions from your health care provider about eating or drinking restrictions. Resume your normal diet as told by your health care provider. Avoid heavy or fried foods that are hard to digest. Activity Rest as told by your health care provider. Avoid sitting for a long time without moving. Get up to take short walks every 1-2 hours. This is important to improve blood flow and breathing. Ask for help if you feel weak  or unsteady. Return to your normal activities as told by your health care provider. Ask your health care provider what activities are safe for you. Managing cramping and bloating  Try walking around when you have cramps or feel bloated. If directed, apply heat to your abdomen as told by your health care provider. Use the heat source that your health care provider recommends, such as a moist heat pack or a heating pad. Place a towel between your skin and the heat source. Leave the heat on for 20-30 minutes. Remove the heat if your skin turns bright red. This is especially important if you are unable to feel pain, heat, or cold. You have a greater risk of  getting burned. General instructions If you were given a sedative during the procedure, it can affect you for several hours. Do not drive or operate machinery until your health care provider says that it is safe. For the first 24 hours after the procedure: Do not sign important documents. Do not drink alcohol. Do your regular daily activities at a slower pace than normal. Eat soft foods that are easy to digest. Take over-the-counter and prescription medicines only as told by your health care provider. Keep all follow-up visits. This is important. Contact a health care provider if: You have blood in your stool 2-3 days after the procedure. Get help right away if: You have more than a small spotting of blood in your stool. You have large blood clots in your stool. You have swelling of your abdomen. You have nausea or vomiting. You have a fever. You have increasing pain in your abdomen that is not relieved with medicine. These symptoms may be an emergency. Get help right away. Call 911. Do not wait to see if the symptoms will go away. Do not drive yourself to the hospital. Summary After the procedure, it is common to have a small amount of blood in your stool. You may also have mild cramping and bloating of your abdomen. If you were given a sedative during the procedure, it can affect you for several hours. Do not drive or operate machinery until your health care provider says that it is safe. Get help right away if you have a lot of blood in your stool, nausea or vomiting, a fever, or increased pain in your abdomen. This information is not intended to replace advice given to you by your health care provider. Make sure you discuss any questions you have with your health care provider. Document Revised: 07/26/2022 Document Reviewed: 02/03/2021 Elsevier Patient Education  2024 [employer].Monitored Anesthesia Care, Care After The following information offers guidance on how to care for  yourself after your procedure. Your health care provider may also give you more specific instructions. If you have problems or questions, contact your health care provider. What can I expect after the procedure? After the procedure, it is common to have: Tiredness. Little or no memory about what happened during or after the procedure. Impaired judgment when it comes to making decisions. Nausea or vomiting. Some trouble with balance. Follow these instructions at home: For the time period you were told by your health care provider:  Rest. Do not participate in activities where you could fall or become injured. Do not drive or use machinery. Do not drink alcohol. Do not take sleeping pills or medicines that cause drowsiness. Do not make important decisions or sign legal documents. Do not take care of children on your own. Medicines Take over-the-counter and prescription medicines only  as told by your health care provider. If you were prescribed antibiotics, take them as told by your health care provider. Do not stop using the antibiotic even if you start to feel better. Eating and drinking Follow instructions from your health care provider about what you may eat and drink. Drink enough fluid to keep your urine pale yellow. If you vomit: Drink clear fluids slowly and in small amounts as you are able. Clear fluids include water, ice chips, low-calorie sports drinks, and fruit juice that has water added to it (diluted fruit juice). Eat light and bland foods in small amounts as you are able. These foods include bananas, applesauce, rice, lean meats, toast, and crackers. General instructions  Have a responsible adult stay with you for the time you are told. It is important to have someone help care for you until you are awake and alert. If you have sleep apnea, surgery and some medicines can increase your risk for breathing problems. Follow instructions from your health care provider about  wearing your sleep device: When you are sleeping. This includes during daytime naps. While taking prescription pain medicines, sleeping medicines, or medicines that make you drowsy. Do not use any products that contain nicotine or tobacco. These products include cigarettes, chewing tobacco, and vaping devices, such as e-cigarettes. If you need help quitting, ask your health care provider. Contact a health care provider if: You feel nauseous or vomit every time you eat or drink. You feel light-headed. You are still sleepy or having trouble with balance after 24 hours. You get a rash. You have a fever. You have redness or swelling around the IV site. Get help right away if: You have trouble breathing. You have new confusion after you get home. These symptoms may be an emergency. Get help right away. Call 911. Do not wait to see if the symptoms will go away. Do not drive yourself to the hospital. This information is not intended to replace advice given to you by your health care provider. Make sure you discuss any questions you have with your health care provider. Document Revised: 11/08/2021 Document Reviewed: 11/08/2021 Elsevier Patient Education  2024 [employer].

## 2023-07-24 ENCOUNTER — Encounter (HOSPITAL_COMMUNITY): Payer: Self-pay

## 2023-07-24 ENCOUNTER — Encounter (HOSPITAL_COMMUNITY)
Admission: RE | Admit: 2023-07-24 | Discharge: 2023-07-24 | Disposition: A | Discharge status: Home / Self Care | Payer: Medicare PPO | Source: Ambulatory Visit | Attending: Internal Medicine | Admitting: Internal Medicine

## 2023-07-24 VITALS — BP 97/63 | HR 76 | Temp 97.8°F | Resp 18 | Ht 67.0 in | Wt 190.9 lb

## 2023-07-24 DIAGNOSIS — Z01812 Encounter for preprocedural laboratory examination: Secondary | ICD-10-CM | POA: Insufficient documentation

## 2023-07-24 DIAGNOSIS — E119 Type 2 diabetes mellitus without complications: Secondary | ICD-10-CM | POA: Diagnosis not present

## 2023-07-24 DIAGNOSIS — Z01818 Encounter for other preprocedural examination: Secondary | ICD-10-CM | POA: Diagnosis present

## 2023-07-24 LAB — BASIC METABOLIC PANEL
Anion gap: 8 (ref 5–15)
BUN: 21 mg/dL (ref 8–23)
CO2: 27 mmol/L (ref 22–32)
Calcium: 9.5 mg/dL (ref 8.9–10.3)
Chloride: 99 mmol/L (ref 98–111)
Creatinine, Ser: 0.83 mg/dL (ref 0.44–1.00)
GFR, Estimated: >60 mL/min (ref >60)
Glucose, Bld: 157 mg/dL — ABNORMAL HIGH (ref 70–99)
Potassium: 4.4 mmol/L (ref 3.5–5.1)
Sodium: 134 mmol/L — ABNORMAL LOW (ref 135–145)

## 2023-07-26 ENCOUNTER — Encounter (HOSPITAL_COMMUNITY)
Admission: RE | Disposition: A | Discharge status: Home / Self Care | Payer: Self-pay | Source: Home / Self Care | Attending: Internal Medicine

## 2023-07-26 ENCOUNTER — Other Ambulatory Visit: Payer: Self-pay

## 2023-07-26 ENCOUNTER — Ambulatory Visit (HOSPITAL_COMMUNITY)
Admission: RE | Admit: 2023-07-26 | Discharge: 2023-07-26 | Disposition: A | Discharge status: Home / Self Care | Payer: Medicare PPO | Attending: Internal Medicine | Admitting: Internal Medicine

## 2023-07-26 ENCOUNTER — Ambulatory Visit (HOSPITAL_COMMUNITY): Payer: Self-pay | Admitting: Anesthesiology

## 2023-07-26 ENCOUNTER — Encounter (HOSPITAL_COMMUNITY): Payer: Self-pay | Admitting: Internal Medicine

## 2023-07-26 DIAGNOSIS — I110 Hypertensive heart disease with heart failure: Secondary | ICD-10-CM | POA: Diagnosis not present

## 2023-07-26 DIAGNOSIS — K5730 Diverticulosis of large intestine without perforation or abscess without bleeding: Secondary | ICD-10-CM | POA: Insufficient documentation

## 2023-07-26 DIAGNOSIS — I509 Heart failure, unspecified: Secondary | ICD-10-CM | POA: Diagnosis not present

## 2023-07-26 DIAGNOSIS — Z1211 Encounter for screening for malignant neoplasm of colon: Secondary | ICD-10-CM | POA: Diagnosis not present

## 2023-07-26 DIAGNOSIS — Z800 Family history of malignant neoplasm of digestive organs: Secondary | ICD-10-CM | POA: Insufficient documentation

## 2023-07-26 DIAGNOSIS — E119 Type 2 diabetes mellitus without complications: Secondary | ICD-10-CM | POA: Insufficient documentation

## 2023-07-26 DIAGNOSIS — Z7984 Long term (current) use of oral hypoglycemic drugs: Secondary | ICD-10-CM | POA: Diagnosis not present

## 2023-07-26 HISTORY — PX: COLONOSCOPY WITH PROPOFOL: SHX5780

## 2023-07-26 LAB — GLUCOSE, CAPILLARY: Glucose-Capillary: 100 mg/dL — ABNORMAL HIGH (ref 70–99)

## 2023-07-26 SURGERY — COLONOSCOPY WITH PROPOFOL
Anesthesia: General

## 2023-07-26 MED ORDER — PROPOFOL 500 MG/50ML IV EMUL
INTRAVENOUS | Status: DC | PRN
  Administered 2023-07-26: 100 ug/kg/min via INTRAVENOUS

## 2023-07-26 MED ORDER — LACTATED RINGERS IV SOLN: INTRAVENOUS | Status: DC | PRN

## 2023-07-26 MED ORDER — STERILE WATER FOR IRRIGATION IR SOLN
Status: DC | PRN
  Administered 2023-07-26: 50 mL

## 2023-07-26 MED ORDER — EPHEDRINE SULFATE (PRESSORS) 50 MG/ML IJ SOLN
INTRAMUSCULAR | Status: DC | PRN
  Administered 2023-07-26: 5 mg via INTRAVENOUS

## 2023-07-26 MED ORDER — LACTATED RINGERS IV SOLN: INTRAVENOUS | Status: DC

## 2023-07-26 MED ORDER — LIDOCAINE HCL (CARDIAC) PF 100 MG/5ML IV SOSY
PREFILLED_SYRINGE | INTRAVENOUS | Status: DC | PRN
  Administered 2023-07-26: 60 mg via INTRAVENOUS

## 2023-07-26 MED ORDER — PROPOFOL 10 MG/ML IV BOLUS
INTRAVENOUS | Status: DC | PRN
  Administered 2023-07-26: 50 mg via INTRAVENOUS

## 2023-07-26 NOTE — Transfer of Care (Signed)
Immediate Anesthesia Transfer of Care Note  Patient: [name]  Procedure(s) Performed: COLONOSCOPY WITH PROPOFOL  Patient Location: PACU  Anesthesia Type:General  Level of Consciousness: awake, alert , and oriented  Airway & Oxygen Therapy: Patient Spontanous Breathing  Post-op Assessment: Report given to RN and Post -op Vital signs reviewed and stable  Post vital signs: Reviewed and stable  Last Vitals:  Vitals Value Taken Time  BP 90/48 07/26/23 0834  Temp 36.5 C 07/26/23 0834  Pulse 67 07/26/23 0834  Resp 18 07/26/23 0834  SpO2 100 % 07/26/23 0834    Last Pain:  Vitals:   07/26/23 0834  TempSrc: Oral  PainSc: 0-No pain      Patients Stated Pain Goal: 5 (07/26/23 0705)  Complications: No notable events documented.

## 2023-07-26 NOTE — Anesthesia Preprocedure Evaluation (Signed)
Anesthesia Evaluation  Patient identified by MRN, date of birth, ID band Patient awake    Reviewed: Allergy & Precautions, H&P , NPO status , Patient's Chart, lab work & pertinent test results, reviewed documented beta blocker date and time   History of Anesthesia Complications (+) PONV and history of anesthetic complications  Airway Mallampati: II  TM Distance: >3 FB Neck ROM: full    Dental no notable dental hx.    Pulmonary neg pulmonary ROS   Pulmonary exam normal breath sounds clear to auscultation       Cardiovascular Exercise Tolerance: Good hypertension, +CHF  negative cardio ROS  Rhythm:regular Rate:Normal     Neuro/Psych negative neurological ROS  negative psych ROS   GI/Hepatic negative GI ROS, Neg liver ROS,,,  Endo/Other  negative endocrine ROSdiabetes    Renal/GU negative Renal ROS  negative genitourinary   Musculoskeletal   Abdominal   Peds  Hematology negative hematology ROS (+)   Anesthesia Other Findings   Reproductive/Obstetrics negative OB ROS                             Anesthesia Physical Anesthesia Plan  ASA: 2  Anesthesia Plan: General   Post-op Pain Management:    Induction:   PONV Risk Score and Plan: Propofol infusion  Airway Management Planned:   Additional Equipment:   Intra-op Plan:   Post-operative Plan:   Informed Consent: I have reviewed the patients History and Physical, chart, labs and discussed the procedure including the risks, benefits and alternatives for the proposed anesthesia with the patient or authorized representative who has indicated his/her understanding and acceptance.     Dental Advisory Given  Plan Discussed with: CRNA  Anesthesia Plan Comments:        Anesthesia Quick Evaluation

## 2023-07-26 NOTE — Anesthesia Postprocedure Evaluation (Signed)
Anesthesia Post Note  Patient: [name]  Procedure(s) Performed: COLONOSCOPY WITH PROPOFOL  Anesthesia Type: General Anesthetic complications: no   No notable events documented.   Last Vitals:  Vitals:   07/26/23 0705 07/26/23 0834  BP: 105/72 (!) 90/48  Pulse: 66 67  Resp: 15 18  Temp: 36.8 C 36.5 C  SpO2: 100% 100%    Last Pain:  Vitals:   07/26/23 0834  TempSrc: Oral  PainSc: 0-No pain                 Marquis Buggy

## 2023-07-26 NOTE — H&P (Signed)
@LOGO @   Primary Care Physician:  Carylon Perches, MD Primary Gastroenterologist:  Dr. Jena Gauss  Pre-Procedure History & Physical: HPI:  [name] is a 73 y.o. female is here for a screening colonoscopy.  Family history positive for brother with colon cancer.  Negative colonoscopy 5 years ago.  No symptoms at this time.  Past Medical History:  Diagnosis Date   Atypical nevi 04/01/2014   Glaucoma    Hyperlipidemia    Hypertension    Nonischemic cardiomyopathy (HCC)    PONV (postoperative nausea and vomiting)    Proximal humerus fracture    Right   Sarcoidosis    Type 2 diabetes mellitus (HCC)    Varicose veins     Past Surgical History:  Procedure Laterality Date   APPENDECTOMY     BIOPSY  09/27/2017   Procedure: BIOPSY;  Surgeon: Corbin Ade, MD;  Location: [facility];  Service: Endoscopy;;  ileocecal valve   CESAREAN SECTION     X 3   COLONOSCOPY     COLONOSCOPY  07/23/2012   Procedure: COLONOSCOPY;  Surgeon: Corbin Ade, MD;  Location: [facility];  Service: Endoscopy;  Laterality: N/A;  7:30 AM   COLONOSCOPY N/A 09/27/2017   Procedure: COLONOSCOPY;  Surgeon: Corbin Ade, MD;  Location: [facility];  Service: Endoscopy;  Laterality: N/A;  8:30   RIGHT/LEFT HEART CATH AND CORONARY ANGIOGRAPHY N/A 03/25/2021   Procedure: RIGHT/LEFT HEART CATH AND CORONARY ANGIOGRAPHY;  Surgeon: Runell Gess, MD;  Location: [facility];  Service: Cardiovascular;  Laterality: N/A;   TONSILLECTOMY     TUBAL LIGATION      Prior to Admission medications   Medication Sig Start Date End Date Taking? Authorizing Provider  ACCU-CHEK GUIDE test strip daily. 04/04/23  Yes [provider]  aspirin EC 81 MG tablet Take 81 mg by mouth at bedtime.   Yes [provider]  brimonidine-timolol (COMBIGAN) 0.2-0.5 % ophthalmic solution INSTILL ONE DROP IN BOTH  EYES TWO TIMES DAILY 10/28/16  Yes [provider]  calcium-vitamin D (OSCAL WITH D) 500-200 MG-UNIT  tablet Take 2 tablets by mouth daily with breakfast. 03/28/21  Yes Azalee Course, PA  carvedilol (COREG) 6.25 MG tablet Take 1 tablet (6.25 mg total) by mouth 2 (two) times daily. 06/16/23  Yes Laurey Morale, MD  dorzolamide (TRUSOPT) 2 % ophthalmic solution Place 1 drop into both eyes 2 (two) times daily. 07/08/17  Yes [provider]  ENTRESTO 24-26 MG TAKE 1 TABLET TWICE DAILY 02/28/23  Yes Laurey Morale, MD  folic acid (FOLVITE) 1 MG tablet TAKE 1 TABLET EVERY DAY 10/17/22  Yes Laurey Morale, MD  gabapentin (NEURONTIN) 300 MG capsule Take 300 mg by mouth daily. 02/18/21  Yes [provider]  glimepiride (AMARYL) 1 MG tablet Take 1 mg by mouth daily before breakfast.   Yes [provider]  latanoprost (XALATAN) 0.005 % ophthalmic solution Place 1 drop into both eyes 2 (two) times daily.   Yes [provider]  Magnesium 400 MG TABS Take 400 mg by mouth daily. 05/28/21  Yes Laurey Morale, MD  metFORMIN (GLUCOPHAGE) 1000 MG tablet Take 1,000 mg by mouth 2 (two) times daily.   Yes [provider]  Multiple Vitamin (MULTI-VITAMIN) tablet Take 1 tablet by mouth. 06/09/15  Yes [provider]  pantoprazole (PROTONIX) 40 MG tablet TAKE 1 TABLET EVERY EVENING 10/17/22  Yes Laurey Morale, MD  spironolactone (ALDACTONE) 25 MG  tablet TAKE 1 TABLET AT BEDTIME (DOSE CHANGE) 02/28/23  Yes Laurey Morale, MD  JARDIANCE 10 MG TABS tablet TAKE 1 TABLET AT BEDTIME (DOSE CHANGE) 02/28/23   Laurey Morale, MD  methotrexate (RHEUMATREX) 2.5 MG tablet Take 8 tablets (20 mg total) by mouth once a week. Needs follow up appointment for more refills. 06/16/23   Laurey Morale, MD    Allergies as of 05/30/2023 - Review Complete 05/18/2023  Allergen Reaction Noted   Macrobid [nitrofurantoin macrocrystal] Nausea And Vomiting 06/28/2012   Codeine Nausea And Vomiting 06/28/2012   Sulfa antibiotics Other (See Comments) 06/28/2012   Cefaclor Other (See Comments)  07/29/2011   Ciprofloxacin  07/23/2012    Family History  Problem Relation Age of Onset   Varicose Veins Mother    Deep vein thrombosis Mother    Cancer Father    Varicose Veins Father    Deep vein thrombosis Father    Diabetes Father    Heart disease Father    Hypertension Father    Diabetes Sister    Heart disease Sister    Hyperlipidemia Sister    Hypertension Sister    Hypertension Son    Colon cancer Brother    Cancer Brother        stomach   Varicose Veins Daughter    Hypertension Son    Miscarriages / [place] Daughter     Social History   Socioeconomic History   Marital status: Widowed    Spouse name: Not on file   Number of children: Not on file   Years of education: Not on file   Highest education level: Not on file  Occupational History   Not on file  Tobacco Use   Smoking status: Never   Smokeless tobacco: Never  Vaping Use   Vaping status: Never Used  Substance and Sexual Activity   Alcohol use: No   Drug use: No   Sexual activity: Not Currently    Birth control/protection: Post-menopausal, Surgical, Abstinence    Comment: tubal  Other Topics Concern   Not on file  Social History Narrative   Not on file   Social Drivers of Health   Financial Resource Strain: Low Risk  (03/09/2023)   Overall Financial Resource Strain (CARDIA)    Difficulty of Paying Living Expenses: Not hard at all  Food Insecurity: No Food Insecurity (03/09/2023)   Hunger Vital Sign    Worried About Running Out of Food in the Last Year: Never true    Ran Out of Food in the Last Year: Never true  Transportation Needs: No Transportation Needs (03/09/2023)   PRAPARE - Administrator, Civil Service (Medical): No    Lack of Transportation (Non-Medical): No  Physical Activity: Insufficiently Active (03/09/2023)   Exercise Vital Sign    Days of Exercise per Week: 1 day    Minutes of Exercise per Session: 30 min  Stress: No Stress Concern Present (03/09/2023)    [employer] of Occupational Health - Occupational Stress Questionnaire    Feeling of Stress : Not at all  Social Connections: Moderately Integrated (03/09/2023)   Social Connection and Isolation Panel [NHANES]    Frequency of Communication with Friends and Family: More than three times a week    Frequency of Social Gatherings with Friends and Family: More than three times a week    Attends Religious Services: More than 4 times per year    Active Member of [employer] or Organizations: Yes  Attends Banker Meetings: More than 4 times per year    Marital Status: Widowed  Intimate Partner Violence: Not At Risk (03/09/2023)   Humiliation, Afraid, Rape, and Kick questionnaire    Fear of Current or Ex-Partner: No    Emotionally Abused: No    Physically Abused: No    Sexually Abused: No    Review of Systems: See HPI, otherwise negative ROS  Physical Exam: BP 105/72   Pulse 66   Temp 98.2 F (36.8 C) (Oral)   Resp 15   Ht 5\' 7"  (1.702 m)   Wt 86.6 kg   SpO2 100%   BMI 29.90 kg/m  General:   Alert,  Well-developed, well-nourished, pleasant and cooperative in NAD Lungs:  Clear throughout to auscultation.   No wheezes, crackles, or rhonchi. No acute distress. Heart:  Regular rate and rhythm; no murmurs, clicks, rubs,  or gallops. Abdomen:  Soft, nontender and nondistended. No masses, hepatosplenomegaly or hernias noted. Normal bowel sounds, without guarding, and without rebound.    Impression/Plan: [name] is now here to undergo a screening colonoscopy.  Risk examination.  Risks, benefits, limitations, imponderables and alternatives regarding colonoscopy have been reviewed with the patient. Questions have been answered. All parties agreeable.     Notice:  This dictation was prepared with Dragon dictation along with smaller phrase technology. Any transcriptional errors that result from this process are unintentional and may not be corrected upon review.

## 2023-07-26 NOTE — Discharge Instructions (Signed)
  Colonoscopy Discharge Instructions  Read the instructions outlined below and refer to this sheet in the next few weeks. These discharge instructions provide you with general information on caring for yourself after you leave the hospital. Your doctor may also give you specific instructions. While your treatment has been planned according to the most current medical practices available, unavoidable complications occasionally occur. If you have any problems or questions after discharge, call Dr. Jena Gauss at [phone]. ACTIVITY You may resume your regular activity, but move at a slower pace for the next 24 hours.  Take frequent rest periods for the next 24 hours.  Walking will help get rid of the air and reduce the bloated feeling in your belly (abdomen).  No driving for 24 hours (because of the medicine (anesthesia) used during the test).   Do not sign any important legal documents or operate any machinery for 24 hours (because of the anesthesia used during the test).  NUTRITION Drink plenty of fluids.  You may resume your normal diet as instructed by your doctor.  Begin with a light meal and progress to your normal diet. Heavy or fried foods are harder to digest and may make you feel sick to your stomach (nauseated).  Avoid alcoholic beverages for 24 hours or as instructed.  MEDICATIONS You may resume your normal medications unless your doctor tells you otherwise.  WHAT YOU CAN EXPECT TODAY Some feelings of bloating in the abdomen.  Passage of more gas than usual.  Spotting of blood in your stool or on the toilet paper.  IF YOU HAD POLYPS REMOVED DURING THE COLONOSCOPY: No aspirin products for 7 days or as instructed.  No alcohol for 7 days or as instructed.  Eat a soft diet for the next 24 hours.  FINDING OUT THE RESULTS OF YOUR TEST Not all test results are available during your visit. If your test results are not back during the visit, make an appointment with your caregiver to find out the  results. Do not assume everything is normal if you have not heard from your caregiver or the medical facility. It is important for you to follow up on all of your test results.  SEEK IMMEDIATE MEDICAL ATTENTION IF: You have more than a spotting of blood in your stool.  Your belly is swollen (abdominal distention).  You are nauseated or vomiting.  You have a temperature over 101.  You have abdominal pain or discomfort that is severe or gets worse throughout the day.     No polyps found today.  Diverticulosis present.  Might consider 1 more screening colonoscopy in 5 years only if overall health permits.  At patient request, I called Hermina Barters at [phone]; call rolled to voicemail.  Left a message.

## 2023-07-26 NOTE — Op Note (Signed)
[facility] Patient Name: [name] Procedure Date: 07/26/2023 8:02 AM MRN: [id number] Date of Birth: 1951-05-19 Attending MD: Gennette Pac , MD, [id number] CSN: [id number] Age: 73 Admit Type: Outpatient Procedure:                Colonoscopy Indications:              Screening in patient at increased risk: Family                            history of 1st-degree relative with colorectal                            cancer Providers:                Gennette Pac, MD, Buel Ream. Thomasena Edis RN, RN,                            Pandora Leiter, Technician Referring MD:              Medicines:                Propofol per Anesthesia Complications:            No immediate complications. Estimated Blood Loss:     Estimated blood loss: none. Procedure:                Pre-Anesthesia Assessment:                           - Prior to the procedure, a History and Physical                            was performed, and patient medications and                            allergies were reviewed. The patient's tolerance of                            previous anesthesia was also reviewed. The risks                            and benefits of the procedure and the sedation                            options and risks were discussed with the patient.                            All questions were answered, and informed consent                            was obtained. Prior Anticoagulants: The patient has                            taken no anticoagulant or antiplatelet agents. ASA  Grade Assessment: III - A patient with severe                            systemic disease. After reviewing the risks and                            benefits, the patient was deemed in satisfactory                            condition to undergo the procedure.                           After obtaining informed consent, the colonoscope                            was passed under direct vision.  Throughout the                            procedure, the patient's blood pressure, pulse, and                            oxygen saturations were monitored continuously. The                            [id number] scope was introduced through the                            anus and advanced to the the cecum, identified by                            appendiceal orifice and ileocecal valve. The                            colonoscopy was performed without difficulty. The                            patient tolerated the procedure well. The quality                            of the bowel preparation was adequate. The                            ileocecal valve, appendiceal orifice, and rectum                            were photographed. The entire colon was well                            visualized. Scope In: 8:17:51 AM Scope Out: 8:27:47 AM Scope Withdrawal Time: 0 hours 6 minutes 21 seconds  Total Procedure Duration: 0 hours 9 minutes 56 seconds  Findings:      The perianal and digital rectal examinations were normal.      Scattered small-mouthed diverticula were found in the sigmoid colon and  descending colon.      The exam was otherwise without abnormality on direct and retroflexion       views. Impression:               - Diverticulosis in the sigmoid colon and in the                            descending colon.                           - The examination was otherwise normal on direct                            and retroflexion views.                           - No specimens collected. Moderate Sedation:      Moderate (conscious) sedation was personally administered by an       anesthesia professional. The following parameters were monitored: oxygen       saturation, heart rate, blood pressure, respiratory rate, EKG, adequacy       of pulmonary ventilation, and response to care. Recommendation:           - Patient has a contact number available for                             emergencies. The signs and symptoms of potential                            delayed complications were discussed with the                            patient. Return to normal activities tomorrow.                            Written discharge instructions were provided to the                            patient.                           - Advance diet as tolerated.                           - Continue present medications.                           -Consider 1 more high risk screening colonoscopy in                            5 years only if overall health permits.                           - Return to GI office (date not yet determined). Procedure Code(s):        --- Professional ---  91478, Colonoscopy, flexible; diagnostic, including                            collection of specimen(s) by brushing or washing,                            when performed (separate procedure) Diagnosis Code(s):        --- Professional ---                           Z80.0, Family history of malignant neoplasm of                            digestive organs                           K57.30, Diverticulosis of large intestine without                            perforation or abscess without bleeding CPT copyright 2022 American [employer] Association. All rights reserved. The codes documented in this report are preliminary and upon coder review may  be revised to meet current compliance requirements. Gerrit Friends. Martisha Toulouse, MD Gennette Pac, MD 07/26/2023 8:34:38 AM This report has been signed electronically. Number of Addenda: 0

## 2023-07-27 ENCOUNTER — Encounter (HOSPITAL_COMMUNITY): Payer: Self-pay | Admitting: Internal Medicine

## 2023-08-06 ENCOUNTER — Other Ambulatory Visit: Payer: Self-pay

## 2023-08-06 ENCOUNTER — Ambulatory Visit
Admission: EM | Admit: 2023-08-06 | Discharge: 2023-08-06 | Disposition: A | Discharge status: Home / Self Care | Payer: Medicare PPO

## 2023-08-06 ENCOUNTER — Encounter: Payer: Self-pay | Admitting: Emergency Medicine

## 2023-08-06 DIAGNOSIS — T162XXA Foreign body in left ear, initial encounter: Secondary | ICD-10-CM | POA: Diagnosis not present

## 2023-08-06 NOTE — ED Provider Notes (Signed)
 [facility]    CSN: [id number] Arrival date & time: 08/06/23  1247      History   Chief Complaint Chief Complaint  Patient presents with   Foreign Body in Ear    HPI [name] is a 73 y.o. female.   Patient presenting today with a piece of her hearing aid stuck in the left ear canal since last night.  She states both her and her sister attempted numerous times to get the piece out but she had significant pain with this so ultimately discontinued efforts.  She states that it is very far back into the ear at this point.  Denies bleeding or drainage, loss of hearing, severe headache.    Past Medical History:  Diagnosis Date   Atypical nevi 04/01/2014   Glaucoma    Hyperlipidemia    Hypertension    Nonischemic cardiomyopathy (HCC)    PONV (postoperative nausea and vomiting)    Proximal humerus fracture    Right   Sarcoidosis    Type 2 diabetes mellitus (HCC)    Varicose veins     Patient Active Problem List   Diagnosis Date Noted   FH: colon cancer 05/02/2023   Hypotension 03/07/2022   Urinary frequency 08/02/2021   Bad odor of urine 08/02/2021   New onset atrial fibrillation (HCC) 03/24/2021   SOB (shortness of breath) 03/24/2021   Acute CHF (congestive heart failure) (HCC) 03/24/2021   Systolic and diastolic CHF, acute (HCC) 03/24/2021   Injury of breast 01/06/2021   Mass of upper outer quadrant of right breast 01/06/2021   Memory difficulties 01/06/2021   Encounter for screening fecal occult blood testing 11/25/2020   Encounter for well woman exam with routine gynecological exam 11/25/2020   Screening for colorectal cancer 11/20/2019   Encounter for gynecological examination with Papanicolaou smear of cervix 11/20/2019   Routine cervical smear 11/20/2019   Vulvar dystrophy 04/27/2017   Lichen sclerosus et atrophicus 12/02/2016   Encounter for colorectal cancer screening 12/02/2016   Vaginal dryness 04/01/2014   Atypical nevi 04/01/2014    Varicose veins of leg with pain 03/27/2014    Past Surgical History:  Procedure Laterality Date   APPENDECTOMY     BIOPSY  09/27/2017   Procedure: BIOPSY;  Surgeon: Shaaron Lamar HERO, MD;  Location: [facility];  Service: Endoscopy;;  ileocecal valve   CESAREAN SECTION     X 3   COLONOSCOPY     COLONOSCOPY  07/23/2012   Procedure: COLONOSCOPY;  Surgeon: Lamar HERO Shaaron, MD;  Location: [facility];  Service: Endoscopy;  Laterality: N/A;  7:30 AM   COLONOSCOPY N/A 09/27/2017   Procedure: COLONOSCOPY;  Surgeon: Shaaron Lamar HERO, MD;  Location: [facility];  Service: Endoscopy;  Laterality: N/A;  8:30   COLONOSCOPY WITH PROPOFOL  N/A 07/26/2023   Procedure: COLONOSCOPY WITH PROPOFOL ;  Surgeon: Shaaron Lamar HERO, MD;  Location: [facility];  Service: Endoscopy;  Laterality: N/A;  8:15 am, asa 3   RIGHT/LEFT HEART CATH AND CORONARY ANGIOGRAPHY N/A 03/25/2021   Procedure: RIGHT/LEFT HEART CATH AND CORONARY ANGIOGRAPHY;  Surgeon: Court Dorn PARAS, MD;  Location: [facility];  Service: Cardiovascular;  Laterality: N/A;   TONSILLECTOMY     TUBAL LIGATION      OB History     Gravida  4   Para  4   Term  3   Preterm  1   AB      Living  3  SAB      IAB      Ectopic      Multiple      Live Births  3            Home Medications    Prior to Admission medications   Medication Sig Start Date End Date Taking? Authorizing Provider  ACCU-CHEK GUIDE test strip daily. 04/04/23   [provider]  aspirin  EC 81 MG tablet Take 81 mg by mouth at bedtime.    [provider]  brimonidine -timolol  (COMBIGAN ) 0.2-0.5 % ophthalmic solution INSTILL ONE DROP IN BOTH  EYES TWO TIMES DAILY 10/28/16   [provider]  calcium -vitamin D  (OSCAL WITH D) 500-200 MG-UNIT tablet Take 2 tablets by mouth daily with breakfast. 03/28/21   Meng, Hao, PA  carvedilol  (COREG ) 6.25 MG tablet Take 1 tablet (6.25 mg total) by mouth 2 (two) times daily. 06/16/23   Rolan Ezra RAMAN, MD  dorzolamide  (TRUSOPT ) 2 % ophthalmic solution Place 1 drop into both eyes 2 (two) times daily. 07/08/17   [provider]  ENTRESTO  24-26 MG TAKE 1 TABLET TWICE DAILY 02/28/23   Rolan Ezra RAMAN, MD  folic acid  (FOLVITE ) 1 MG tablet TAKE 1 TABLET EVERY DAY 10/17/22   Rolan Ezra RAMAN, MD  gabapentin  (NEURONTIN ) 300 MG capsule Take 300 mg by mouth daily. 02/18/21   [provider]  glimepiride  (AMARYL ) 1 MG tablet Take 1 mg by mouth daily before breakfast.    [provider]  JARDIANCE  10 MG TABS tablet TAKE 1 TABLET AT BEDTIME (DOSE CHANGE) 02/28/23   Rolan Ezra RAMAN, MD  latanoprost  (XALATAN ) 0.005 % ophthalmic solution Place 1 drop into both eyes 2 (two) times daily.    [provider]  Magnesium  400 MG TABS Take 400 mg by mouth daily. 05/28/21   Rolan Ezra RAMAN, MD  metFORMIN (GLUCOPHAGE) 1000 MG tablet Take 1,000 mg by mouth 2 (two) times daily.    [provider]  methotrexate  (RHEUMATREX) 2.5 MG tablet Take 8 tablets (20 mg total) by mouth once a week. Needs follow up appointment for more refills. 06/16/23   Rolan Ezra RAMAN, MD  Multiple Vitamin (MULTI-VITAMIN) tablet Take 1 tablet by mouth. 06/09/15   [provider]  pantoprazole  (PROTONIX ) 40 MG tablet TAKE 1 TABLET EVERY EVENING 10/17/22   Rolan Ezra RAMAN, MD  spironolactone  (ALDACTONE ) 25 MG tablet TAKE 1 TABLET AT BEDTIME (DOSE CHANGE) 02/28/23   Rolan Ezra RAMAN, MD    Family History Family History  Problem Relation Age of Onset   Varicose Veins Mother    Deep vein thrombosis Mother    Cancer Father    Varicose Veins Father    Deep vein thrombosis Father    Diabetes Father    Heart disease Father    Hypertension Father    Diabetes Sister    Heart disease Sister    Hyperlipidemia Sister    Hypertension Sister    Hypertension Son    Colon cancer Brother    Cancer Brother        stomach   Varicose Veins Daughter    Hypertension Son    Miscarriages / Stillbirths  Daughter     Social History Social History   Tobacco Use   Smoking status: Never   Smokeless tobacco: Never  Vaping Use   Vaping status: Never Used  Substance Use Topics   Alcohol use: No   Drug use: No     Allergies   Macrobid [  nitrofurantoin macrocrystal], Codeine, Sulfa  antibiotics, Cefaclor, and Ciprofloxacin   Review of Systems Review of Systems Per HPI  Physical Exam Triage Vital Signs ED Triage Vitals [08/06/23 1344]  Encounter Vitals Group     BP 112/68     Systolic BP Percentile      Diastolic BP Percentile      Pulse Rate 68     Resp 20     Temp 98.7 F (37.1 C)     Temp Source Oral     SpO2 93 %     Weight      Height      Head Circumference      Peak Flow      Pain Score 0     Pain Loc      Pain Education      Exclude from Growth Chart    No data found.  Updated Vital Signs BP 112/68 (BP Location: Right Arm)   Pulse 68   Temp 98.7 F (37.1 C) (Oral)   Resp 20   SpO2 93%   Visual Acuity Right Eye Distance:   Left Eye Distance:   Bilateral Distance:    Right Eye Near:   Left Eye Near:    Bilateral Near:     Physical Exam Vitals and nursing note reviewed.  Constitutional:      Appearance: Normal appearance. She is not ill-appearing.  HENT:     Head: Atraumatic.     Ears:     Comments: Silicone hearing aid piece visualized within left EAC pressed up against the tympanic membrane.  No bleeding or drainage present to the canal Eyes:     Extraocular Movements: Extraocular movements intact.     Conjunctiva/sclera: Conjunctivae normal.  Cardiovascular:     Rate and Rhythm: Normal rate and regular rhythm.     Heart sounds: Normal heart sounds.  Pulmonary:     Effort: Pulmonary effort is normal.     Breath sounds: Normal breath sounds.  Musculoskeletal:        General: Normal range of motion.     Cervical back: Normal range of motion and neck supple.  Skin:    General: Skin is warm and dry.  Neurological:     Mental Status: She  is alert and oriented to person, place, and time.  Psychiatric:        Mood and Affect: Mood normal.        Thought Content: Thought content normal.        Judgment: Judgment normal.      UC Treatments / Results  Labs (all labs ordered are listed, but only abnormal results are displayed) Labs Reviewed - No data to display  EKG   Radiology No results found.  Procedures Procedures (including critical care time)  Medications Ordered in UC Medications - No data to display  Initial Impression / Assessment and Plan / UC Course  I have reviewed the triage vital signs and the nursing notes.  Pertinent labs & imaging results that were available during my care of the patient were reviewed by me and considered in my medical decision making (see chart for details).     Attempted a gentle retrieval with tweezers multiple times as well as a gentle lavage with warm water  and peroxide without the ability to provide a safe removal.  Discussed with patient that because the piece is pushed up against the tympanic membrane do not want to risk rupturing the membrane with further attempts so recommended follow-up  with ear nose and throat first thing next week for further attempts of removal. Final Clinical Impressions(s) / UC Diagnoses   Final diagnoses:  Foreign body of left ear, initial encounter   Discharge Instructions   None    ED Prescriptions   None    PDMP not reviewed this encounter.   Stuart Vernell Norris, [place] 08/06/23 1511

## 2023-08-06 NOTE — ED Triage Notes (Addendum)
 Pt reports possible "tip of hearing aid" is in left ear since last night.

## 2023-08-07 DIAGNOSIS — H401133 Primary open-angle glaucoma, bilateral, severe stage: Secondary | ICD-10-CM | POA: Diagnosis not present

## 2023-08-07 DIAGNOSIS — T162XXA Foreign body in left ear, initial encounter: Secondary | ICD-10-CM | POA: Diagnosis not present

## 2023-09-14 DIAGNOSIS — I5022 Chronic systolic (congestive) heart failure: Secondary | ICD-10-CM | POA: Diagnosis not present

## 2023-09-14 DIAGNOSIS — E785 Hyperlipidemia, unspecified: Secondary | ICD-10-CM | POA: Diagnosis not present

## 2023-09-14 DIAGNOSIS — Z79899 Other long term (current) drug therapy: Secondary | ICD-10-CM | POA: Diagnosis not present

## 2023-09-14 DIAGNOSIS — I10 Essential (primary) hypertension: Secondary | ICD-10-CM | POA: Diagnosis not present

## 2023-09-14 DIAGNOSIS — E1129 Type 2 diabetes mellitus with other diabetic kidney complication: Secondary | ICD-10-CM | POA: Diagnosis not present

## 2023-09-25 DIAGNOSIS — E1122 Type 2 diabetes mellitus with diabetic chronic kidney disease: Secondary | ICD-10-CM | POA: Diagnosis not present

## 2023-09-25 DIAGNOSIS — D8685 Sarcoid myocarditis: Secondary | ICD-10-CM | POA: Diagnosis not present

## 2023-09-25 DIAGNOSIS — D696 Thrombocytopenia, unspecified: Secondary | ICD-10-CM | POA: Diagnosis not present

## 2023-09-25 DIAGNOSIS — I5022 Chronic systolic (congestive) heart failure: Secondary | ICD-10-CM | POA: Diagnosis not present

## 2023-09-25 DIAGNOSIS — R413 Other amnesia: Secondary | ICD-10-CM | POA: Diagnosis not present

## 2023-10-01 ENCOUNTER — Emergency Department (HOSPITAL_BASED_OUTPATIENT_CLINIC_OR_DEPARTMENT_OTHER)
Admission: EM | Admit: 2023-10-01 | Discharge: 2023-10-01 | Disposition: A | Discharge status: Home / Self Care | Attending: Emergency Medicine | Admitting: Emergency Medicine

## 2023-10-01 ENCOUNTER — Encounter (HOSPITAL_BASED_OUTPATIENT_CLINIC_OR_DEPARTMENT_OTHER): Payer: Self-pay | Admitting: Emergency Medicine

## 2023-10-01 DIAGNOSIS — Z7984 Long term (current) use of oral hypoglycemic drugs: Secondary | ICD-10-CM | POA: Diagnosis not present

## 2023-10-01 DIAGNOSIS — E860 Dehydration: Secondary | ICD-10-CM | POA: Insufficient documentation

## 2023-10-01 DIAGNOSIS — Z7982 Long term (current) use of aspirin: Secondary | ICD-10-CM | POA: Diagnosis not present

## 2023-10-01 DIAGNOSIS — E119 Type 2 diabetes mellitus without complications: Secondary | ICD-10-CM | POA: Insufficient documentation

## 2023-10-01 DIAGNOSIS — R112 Nausea with vomiting, unspecified: Secondary | ICD-10-CM | POA: Insufficient documentation

## 2023-10-01 DIAGNOSIS — I10 Essential (primary) hypertension: Secondary | ICD-10-CM | POA: Insufficient documentation

## 2023-10-01 DIAGNOSIS — R197 Diarrhea, unspecified: Secondary | ICD-10-CM | POA: Diagnosis not present

## 2023-10-01 DIAGNOSIS — R001 Bradycardia, unspecified: Secondary | ICD-10-CM | POA: Insufficient documentation

## 2023-10-01 LAB — URINALYSIS, ROUTINE W REFLEX MICROSCOPIC
Bacteria, UA: NONE SEEN
Bilirubin Urine: NEGATIVE
Glucose, UA: >1000 mg/dL — AB
Hgb urine dipstick: NEGATIVE
Ketones, ur: 15 mg/dL — AB
Leukocytes,Ua: NEGATIVE
Nitrite: NEGATIVE
Protein, ur: NEGATIVE mg/dL
Specific Gravity, Urine: 1.033 — ABNORMAL HIGH (ref 1.005–1.030)
pH: 6.5 (ref 5.0–8.0)

## 2023-10-01 LAB — COMPREHENSIVE METABOLIC PANEL WITH GFR
ALT: 17 U/L (ref 0–44)
AST: 21 U/L (ref 15–41)
Albumin: 4.5 g/dL (ref 3.5–5.0)
Alkaline Phosphatase: 44 U/L (ref 38–126)
Anion gap: 10 (ref 5–15)
BUN: 16 mg/dL (ref 8–23)
CO2: 25 mmol/L (ref 22–32)
Calcium: 9.6 mg/dL (ref 8.9–10.3)
Chloride: 104 mmol/L (ref 98–111)
Creatinine, Ser: 0.85 mg/dL (ref 0.44–1.00)
GFR, Estimated: >60 mL/min (ref >60)
Glucose, Bld: 189 mg/dL — ABNORMAL HIGH (ref 70–99)
Potassium: 4.2 mmol/L (ref 3.5–5.1)
Sodium: 139 mmol/L (ref 135–145)
Total Bilirubin: 1.3 mg/dL — ABNORMAL HIGH (ref 0.0–1.2)
Total Protein: 7 g/dL (ref 6.5–8.1)

## 2023-10-01 LAB — CBC
HCT: 42.3 % (ref 36.0–46.0)
Hemoglobin: 14.8 g/dL (ref 12.0–15.0)
MCH: 32.7 pg (ref 26.0–34.0)
MCHC: 35 g/dL (ref 30.0–36.0)
MCV: 93.6 fL (ref 80.0–100.0)
Platelets: 159 10*3/uL (ref 150–400)
RBC: 4.52 MIL/uL (ref 3.87–5.11)
RDW: 13.4 % (ref 11.5–15.5)
WBC: 6.4 10*3/uL (ref 4.0–10.5)
nRBC: 0 % (ref 0.0–0.2)

## 2023-10-01 LAB — TROPONIN I (HIGH SENSITIVITY): Troponin I (High Sensitivity): 3 ng/L (ref <18)

## 2023-10-01 LAB — LIPASE, BLOOD: Lipase: 31 U/L (ref 11–51)

## 2023-10-01 MED ORDER — SODIUM CHLORIDE 0.9 % IV BOLUS
1000.0000 mL | Freq: Once | INTRAVENOUS | Status: AC
  Administered 2023-10-01: 1000 mL via INTRAVENOUS

## 2023-10-01 MED ORDER — ONDANSETRON 4 MG PO TBDP: 4.0000 mg | ORAL_TABLET | Freq: Three times a day (TID) | ORAL | 0 refills | Status: AC | PRN

## 2023-10-01 MED ORDER — ONDANSETRON HCL 4 MG/2ML IJ SOLN
4.0000 mg | Freq: Once | INTRAMUSCULAR | Status: AC
  Administered 2023-10-01: 4 mg via INTRAVENOUS
  Filled 2023-10-01: qty 2

## 2023-10-01 NOTE — ED Notes (Signed)
 Reviewed AVS/discharge instruction with patient. Time allotted for and all questions answered. Patient is agreeable for d/c and escorted to ed exit by staff.

## 2023-10-01 NOTE — ED Notes (Signed)
 PO challenge with ginger ale.  No nausea, pt reports "feeling so much better"

## 2023-10-01 NOTE — ED Notes (Signed)
 Patient notified of need for urine sample to complete eval/assessment. Specimen cup provided and instructions for clean catch given. Patient will notify staff when able to provide

## 2023-10-01 NOTE — ED Triage Notes (Signed)
 Woke up feeling ok, went to church then started vomiting and "feeling horrible", some diarrhea.

## 2023-10-01 NOTE — ED Notes (Signed)
 Given  diet  ginger ale

## 2023-10-01 NOTE — ED Notes (Signed)
 Ambulatory to restroom

## 2023-10-01 NOTE — ED Notes (Signed)
 Pt aware of the need for a urine... Unable to currently provide the sample.Paige KitchenMarland Hatfield

## 2023-10-01 NOTE — ED Provider Notes (Signed)
 [place] EMERGENCY DEPARTMENT AT [facility] Provider Note   CSN: [id number] Arrival date & time: 10/01/23  1145     History  Chief Complaint  Patient presents with   Emesis    [name] is a 73 y.o. female.  Pt is a 73 yo female with pmhx significant for htn, glaucoma, hld, sarcoidosis, dm2, and nonischemic cm.  Pt said she felt fine this am and then developed vomiting and diarrhea during church.  Pt said she feels horrible.  No pain.  No fevers.  No known sick contacts.       Home Medications Prior to Admission medications   Medication Sig Start Date End Date Taking? Authorizing Provider  ondansetron (ZOFRAN-ODT) 4 MG disintegrating tablet Take 1 tablet (4 mg total) by mouth every 8 (eight) hours as needed for nausea or vomiting. 10/01/23  Yes Jacalyn Lefevre, MD  ACCU-CHEK GUIDE test strip daily. 04/04/23   [provider]  aspirin EC 81 MG tablet Take 81 mg by mouth at bedtime.    [provider]  brimonidine-timolol (COMBIGAN) 0.2-0.5 % ophthalmic solution INSTILL ONE DROP IN BOTH  EYES TWO TIMES DAILY 10/28/16   [provider]  calcium-vitamin D (OSCAL WITH D) 500-200 MG-UNIT tablet Take 2 tablets by mouth daily with breakfast. 03/28/21   Azalee Course, PA  carvedilol (COREG) 6.25 MG tablet Take 1 tablet (6.25 mg total) by mouth 2 (two) times daily. 06/16/23   Laurey Morale, MD  dorzolamide (TRUSOPT) 2 % ophthalmic solution Place 1 drop into both eyes 2 (two) times daily. 07/08/17   [provider]  ENTRESTO 24-26 MG TAKE 1 TABLET TWICE DAILY 02/28/23   Laurey Morale, MD  folic acid (FOLVITE) 1 MG tablet TAKE 1 TABLET EVERY DAY 10/17/22   Laurey Morale, MD  gabapentin (NEURONTIN) 300 MG capsule Take 300 mg by mouth daily. 02/18/21   [provider]  glimepiride (AMARYL) 1 MG tablet Take 1 mg by mouth daily before breakfast.    [provider]  JARDIANCE 10 MG TABS tablet TAKE 1 TABLET AT BEDTIME (DOSE CHANGE)  02/28/23   Laurey Morale, MD  latanoprost (XALATAN) 0.005 % ophthalmic solution Place 1 drop into both eyes 2 (two) times daily.    [provider]  Magnesium 400 MG TABS Take 400 mg by mouth daily. 05/28/21   Laurey Morale, MD  metFORMIN (GLUCOPHAGE) 1000 MG tablet Take 1,000 mg by mouth 2 (two) times daily.    [provider]  methotrexate (RHEUMATREX) 2.5 MG tablet Take 8 tablets (20 mg total) by mouth once a week. Needs follow up appointment for more refills. 06/16/23   Laurey Morale, MD  Multiple Vitamin (MULTI-VITAMIN) tablet Take 1 tablet by mouth. 06/09/15   [provider]  pantoprazole (PROTONIX) 40 MG tablet TAKE 1 TABLET EVERY EVENING 10/17/22   Laurey Morale, MD  spironolactone (ALDACTONE) 25 MG tablet TAKE 1 TABLET AT BEDTIME (DOSE CHANGE) 02/28/23   Laurey Morale, MD      Allergies    Macrobid [nitrofurantoin macrocrystal], Codeine, Sulfa antibiotics, Cefaclor, and Ciprofloxacin    Review of Systems   Review of Systems  Gastrointestinal:  Positive for diarrhea, nausea and vomiting.  All other systems reviewed and are negative.   Physical Exam Updated Vital Signs BP (!) 105/59   Pulse (!) 54   Temp 97.8 F (36.6 C) (Oral)   Resp 18   SpO2 100%  Physical Exam Vitals and  nursing note reviewed.  Constitutional:      Appearance: Normal appearance.  HENT:     Head: Normocephalic and atraumatic.     Right Ear: External ear normal.     Left Ear: External ear normal.     Nose: Nose normal.     Mouth/Throat:     Mouth: Mucous membranes are dry.  Eyes:     Extraocular Movements: Extraocular movements intact.     Conjunctiva/sclera: Conjunctivae normal.     Pupils: Pupils are equal, round, and reactive to light.  Cardiovascular:     Rate and Rhythm: Regular rhythm. Bradycardia present.     Pulses: Normal pulses.     Heart sounds: Normal heart sounds.  Pulmonary:     Effort: Pulmonary effort is normal.     Breath sounds: Normal  breath sounds.  Abdominal:     General: Abdomen is flat. Bowel sounds are normal.     Palpations: Abdomen is soft.  Musculoskeletal:        General: Normal range of motion.     Cervical back: Normal range of motion and neck supple.  Skin:    General: Skin is warm.     Capillary Refill: Capillary refill takes less than 2 seconds.  Neurological:     General: No focal deficit present.     Mental Status: She is alert and oriented to person, place, and time.  Psychiatric:        Mood and Affect: Mood normal.        Behavior: Behavior normal.     ED Results / Procedures / Treatments   Labs (all labs ordered are listed, but only abnormal results are displayed) Labs Reviewed  COMPREHENSIVE METABOLIC PANEL WITH GFR - Abnormal; Notable for the following components:      Result Value   Glucose, Bld 189 (*)    Total Bilirubin 1.3 (*)    All other components within normal limits  URINALYSIS, ROUTINE W REFLEX MICROSCOPIC - Abnormal; Notable for the following components:   Specific Gravity, Urine 1.033 (*)    Glucose, UA >1,000 (*)    Ketones, ur 15 (*)    All other components within normal limits  LIPASE, BLOOD  CBC  TROPONIN I (HIGH SENSITIVITY)    EKG EKG Interpretation Date/Time:  Sunday October 01 2023 12:32:12 EDT Ventricular Rate:  54 PR Interval:  203 QRS Duration:  145 QT Interval:  475 QTC Calculation: 451 R Axis:   -62  Text Interpretation: Sinus rhythm Left bundle branch block No significant change since last tracing Confirmed by Jacalyn Lefevre [phone]) on 10/01/2023 1:28:15 PM  Radiology No results found.  Procedures Procedures    Medications Ordered in ED Medications  sodium chloride 0.9 % bolus 1,000 mL (0 mLs Intravenous Stopped 10/01/23 1320)  ondansetron (ZOFRAN) injection 4 mg (4 mg Intravenous Given 10/01/23 1217)    ED Course/ Medical Decision Making/ A&P                                 Medical Decision Making Amount and/or Complexity of Data  Reviewed Labs: ordered.  Risk Prescription drug management.   This patient presents to the ED for concern of n/v/d, this involves an extensive number of treatment options, and is a complaint that carries with it a high risk of complications and morbidity.  The differential diagnosis includes infection, gastroenteritis, dehydration   Co morbidities that complicate the patient evaluation  htn, glaucoma, hld, sarcoidosis, dm2, and nonischemic cm   Additional history obtained:  Additional history obtained from epic chart review External records from outside source obtained and reviewed including family   Lab Tests:  I Ordered, and personally interpreted labs.  The pertinent results include:  cbc nl, cmp nl, lip nl, trop nl, ua nl   Cardiac Monitoring:  The patient was maintained on a cardiac monitor.  I personally viewed and interpreted the cardiac monitored which showed an underlying rhythm of: nsr   Medicines ordered and prescription drug management:  I ordered medication including ivfs/zofran  for sx  Reevaluation of the patient after these medicines showed that the patient improved I have reviewed the patients home medicines and have made adjustments as needed  Problem List / ED Course:  Dehydration and n/v:  pt is feeling much better.  She feels back to normal after fluids and zofran.  She is able to tolerate po fluids.  She is stable for d/c.  Return if worse    Reevaluation:  After the interventions noted above, I reevaluated the patient and found that they have :improved   Social Determinants of Health:  Lives at home   Dispostion:  After consideration of the diagnostic results and the patients response to treatment, I feel that the patent would benefit from discharge with outpatient f/u.          Final Clinical Impression(s) / ED Diagnoses Final diagnoses:  Nausea and vomiting, unspecified vomiting type  Dehydration    Rx / DC Orders ED  Discharge Orders          Ordered    ondansetron (ZOFRAN-ODT) 4 MG disintegrating tablet  Every 8 hours PRN        10/01/23 1450              Jacalyn Lefevre, MD 10/01/23 1451

## 2023-10-25 ENCOUNTER — Telehealth (HOSPITAL_COMMUNITY): Payer: Self-pay | Admitting: *Deleted

## 2023-10-25 ENCOUNTER — Other Ambulatory Visit (HOSPITAL_COMMUNITY): Payer: Self-pay | Admitting: Cardiology

## 2023-10-25 NOTE — Telephone Encounter (Signed)
 Received refill request for Entresto  49-51 mg from her pharmacy. Called patient to clarify dose. Last visit here with Dr. Julane Ny she was on 24-26mg . Pt unsure of what dose she is taking. Past due f/u appointment scheduled so she can review medication needs with provider.
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
## 2023-11-08 ENCOUNTER — Ambulatory Visit (HOSPITAL_COMMUNITY)
Admission: RE | Admit: 2023-11-08 | Discharge: 2023-11-08 | Disposition: A | Discharge status: Home / Self Care | Source: Ambulatory Visit | Attending: Family Medicine | Admitting: Family Medicine

## 2023-11-08 ENCOUNTER — Encounter (HOSPITAL_COMMUNITY): Payer: Self-pay

## 2023-11-08 VITALS — BP 98/64 | HR 86 | Wt 195.2 lb

## 2023-11-08 DIAGNOSIS — I4719 Other supraventricular tachycardia: Secondary | ICD-10-CM | POA: Diagnosis not present

## 2023-11-08 DIAGNOSIS — Z79899 Other long term (current) drug therapy: Secondary | ICD-10-CM | POA: Diagnosis not present

## 2023-11-08 DIAGNOSIS — E119 Type 2 diabetes mellitus without complications: Secondary | ICD-10-CM | POA: Insufficient documentation

## 2023-11-08 DIAGNOSIS — I110 Hypertensive heart disease with heart failure: Secondary | ICD-10-CM | POA: Insufficient documentation

## 2023-11-08 DIAGNOSIS — D8685 Sarcoid myocarditis: Secondary | ICD-10-CM | POA: Diagnosis not present

## 2023-11-08 DIAGNOSIS — I472 Ventricular tachycardia, unspecified: Secondary | ICD-10-CM | POA: Insufficient documentation

## 2023-11-08 DIAGNOSIS — Z7984 Long term (current) use of oral hypoglycemic drugs: Secondary | ICD-10-CM | POA: Diagnosis not present

## 2023-11-08 DIAGNOSIS — I5022 Chronic systolic (congestive) heart failure: Secondary | ICD-10-CM | POA: Insufficient documentation

## 2023-11-08 DIAGNOSIS — D869 Sarcoidosis, unspecified: Secondary | ICD-10-CM | POA: Diagnosis not present

## 2023-11-08 DIAGNOSIS — I493 Ventricular premature depolarization: Secondary | ICD-10-CM | POA: Diagnosis not present

## 2023-11-08 DIAGNOSIS — I428 Other cardiomyopathies: Secondary | ICD-10-CM | POA: Diagnosis not present

## 2023-11-08 MED ORDER — CARVEDILOL 6.25 MG PO TABS: 6.2500 mg | ORAL_TABLET | Freq: Two times a day (BID) | ORAL | 11 refills | Status: DC

## 2023-11-08 MED ORDER — ASPIRIN EC 81 MG PO TBEC: 81.0000 mg | DELAYED_RELEASE_TABLET | Freq: Every day | ORAL | 8 refills | Status: AC

## 2023-11-08 MED ORDER — ENTRESTO 24-26 MG PO TABS: 1.0000 | ORAL_TABLET | Freq: Two times a day (BID) | ORAL | 3 refills | Status: DC

## 2023-11-08 MED ORDER — METHOTREXATE SODIUM 2.5 MG PO TABS: 20.0000 mg | ORAL_TABLET | ORAL | 3 refills | Status: AC

## 2023-11-08 MED ORDER — EMPAGLIFLOZIN 10 MG PO TABS: 10.0000 mg | ORAL_TABLET | Freq: Every day | ORAL | 3 refills | Status: AC

## 2023-11-08 MED ORDER — SPIRONOLACTONE 25 MG PO TABS: 25.0000 mg | ORAL_TABLET | Freq: Every day | ORAL | 3 refills | Status: DC

## 2023-11-08 NOTE — Patient Instructions (Addendum)
 Thank you for coming in today  If you had labs drawn today, any labs that are abnormal the clinic will call you No news is good news  Medications: No changes  Follow up appointments: Your physician recommends that you return for lab work in: 3 months CMET CBC  Your physician recommends that you schedule a follow-up appointment in:  6 months With Dr. Mclean with echocardiogram  Please call our office to schedule the follow-up appointment in August/September for November 2025  Your physician has requested that you have an echocardiogram. Echocardiography is a painless test that uses sound waves to create images of your heart. It provides your doctor with information about the size and shape of your heart and how well your heart's chambers and valves are working. This procedure takes approximately one hour. There are no restrictions for this procedure.      Do the following things EVERYDAY: Weigh yourself in the morning before breakfast. Write it down and keep it in a log. Take your medicines as prescribed Eat low salt foods--Limit salt (sodium) to 2000 mg per day.  Stay as active as you can everyday Limit all fluids for the day to less than 2 liters   At the [facility], you and your health needs are our priority. As part of our continuing mission to provide you with exceptional heart care, we have created designated Provider Care Teams. These Care Teams include your primary Cardiologist (physician) and Advanced Practice Providers (APPs- Physician Assistants and Nurse Practitioners) who all work together to provide you with the care you need, when you need it.   You may see any of the following providers on your designated Care Team at your next follow up: Dr Jules Oar Dr Peder Bourdon Dr. Mimi Alt, NP Ruddy Corral, [place] [facility] [place], [place] Dennise Fitz, NP Luster Salters, PharmD   Please be sure to bring in all your  medications bottles to every appointment.    Thank you for choosing [facility]  If you have any questions or concerns before your next appointment please send us  a message through [place] or call our office at [phone].    TO LEAVE A MESSAGE FOR THE NURSE SELECT OPTION 2, PLEASE LEAVE A MESSAGE INCLUDING: YOUR NAME DATE OF BIRTH CALL BACK NUMBER REASON FOR CALL**this is important as we prioritize the call backs  YOU WILL RECEIVE A CALL BACK THE SAME DAY AS LONG AS YOU CALL BEFORE 4:00 PM

## 2023-11-27 ENCOUNTER — Encounter (HOSPITAL_COMMUNITY): Payer: Self-pay | Admitting: Cardiology

## 2023-11-28 ENCOUNTER — Ambulatory Visit (HOSPITAL_COMMUNITY)
Admission: RE | Admit: 2023-11-28 | Discharge: 2023-11-28 | Disposition: A | Discharge status: Home / Self Care | Source: Ambulatory Visit | Attending: Cardiology | Admitting: Cardiology

## 2023-11-28 VITALS — BP 94/62 | HR 55 | Wt 192.4 lb

## 2023-11-28 DIAGNOSIS — I447 Left bundle-branch block, unspecified: Secondary | ICD-10-CM | POA: Diagnosis not present

## 2023-11-28 DIAGNOSIS — R001 Bradycardia, unspecified: Secondary | ICD-10-CM | POA: Insufficient documentation

## 2023-11-28 DIAGNOSIS — R42 Dizziness and giddiness: Secondary | ICD-10-CM | POA: Insufficient documentation

## 2023-11-28 DIAGNOSIS — I5041 Acute combined systolic (congestive) and diastolic (congestive) heart failure: Secondary | ICD-10-CM | POA: Diagnosis not present

## 2023-11-28 MED ORDER — SPIRONOLACTONE 25 MG PO TABS: 12.5000 mg | ORAL_TABLET | Freq: Every day | ORAL | 3 refills | Status: AC

## 2023-11-28 MED ORDER — CARVEDILOL 3.125 MG PO TABS: 3.1250 mg | ORAL_TABLET | Freq: Two times a day (BID) | ORAL | 6 refills | Status: DC

## 2023-11-28 NOTE — Progress Notes (Signed)
 Per Dr Mitzie Anda:  Paige Eisenmenger, MD to [facility], Philicia R, CMA   11/27/23  1:43 PM  Come in for BP check and ECG.  May be due to low BP from meds, can decrease Coreg  to 3.125 mg bid and spironolactone  to 12.5 mg at bedtime.  Pt aware and agreeable to med changes, however she gets a monthly pill pack from [facility] so unsure which pills to cut in half. I called and spoke w/pharmacy reg med changes, new rx sent in, they will redo her pill packs, pt is aware and will pick up new pack today

## 2023-11-28 NOTE — Patient Instructions (Signed)
 Decrease Carvedilol  to 3.125 mg Twice daily   Decrease Spironolactone  to 12.5 mg (1/2 tab) Daily at bedtime  Please let us  know if not feeling better with med changes

## 2023-11-28 NOTE — Progress Notes (Signed)
 Pt in for BP check and EKG per Dr Mitzie Anda for dizziness, VS obtained, meds reviewed pt states she did take all meds this AM, CBG checked in clinic=[id number]. Pt states she has had dizziness in past but Sunday was in church and became very dizzy, since that time she has not felt well, dizzy/nausea/decreased appetite. She reports no recent changes to meds.

## 2023-12-11 DIAGNOSIS — H401133 Primary open-angle glaucoma, bilateral, severe stage: Secondary | ICD-10-CM | POA: Diagnosis not present

## 2023-12-19 DIAGNOSIS — H903 Sensorineural hearing loss, bilateral: Secondary | ICD-10-CM | POA: Diagnosis not present

## 2023-12-21 ENCOUNTER — Telehealth: Payer: Self-pay

## 2023-12-21 NOTE — Telephone Encounter (Signed)
 Patient called a wants to know if she has to still do mammograms.  Would like for a nurse to call her.

## 2024-01-18 DIAGNOSIS — E785 Hyperlipidemia, unspecified: Secondary | ICD-10-CM | POA: Diagnosis not present

## 2024-01-18 DIAGNOSIS — I5022 Chronic systolic (congestive) heart failure: Secondary | ICD-10-CM | POA: Diagnosis not present

## 2024-01-18 DIAGNOSIS — I10 Essential (primary) hypertension: Secondary | ICD-10-CM | POA: Diagnosis not present

## 2024-01-18 DIAGNOSIS — E1129 Type 2 diabetes mellitus with other diabetic kidney complication: Secondary | ICD-10-CM | POA: Diagnosis not present

## 2024-01-18 DIAGNOSIS — Z79899 Other long term (current) drug therapy: Secondary | ICD-10-CM | POA: Diagnosis not present

## 2024-01-25 DIAGNOSIS — B353 Tinea pedis: Secondary | ICD-10-CM | POA: Diagnosis not present

## 2024-01-25 DIAGNOSIS — I5022 Chronic systolic (congestive) heart failure: Secondary | ICD-10-CM | POA: Diagnosis not present

## 2024-01-25 DIAGNOSIS — G3184 Mild cognitive impairment, so stated: Secondary | ICD-10-CM | POA: Diagnosis not present

## 2024-01-25 DIAGNOSIS — D696 Thrombocytopenia, unspecified: Secondary | ICD-10-CM | POA: Diagnosis not present

## 2024-01-25 DIAGNOSIS — E1122 Type 2 diabetes mellitus with diabetic chronic kidney disease: Secondary | ICD-10-CM | POA: Diagnosis not present

## 2024-01-29 DIAGNOSIS — H401133 Primary open-angle glaucoma, bilateral, severe stage: Secondary | ICD-10-CM | POA: Diagnosis not present

## 2024-02-08 ENCOUNTER — Other Ambulatory Visit (HOSPITAL_COMMUNITY)

## 2024-02-15 ENCOUNTER — Ambulatory Visit (HOSPITAL_COMMUNITY)
Admission: RE | Admit: 2024-02-15 | Discharge: 2024-02-15 | Disposition: A | Discharge status: Home / Self Care | Source: Ambulatory Visit | Attending: Cardiology | Admitting: Cardiology

## 2024-02-15 DIAGNOSIS — I5022 Chronic systolic (congestive) heart failure: Secondary | ICD-10-CM | POA: Diagnosis not present

## 2024-02-15 LAB — COMPREHENSIVE METABOLIC PANEL WITH GFR
ALT: 15 U/L (ref 0–44)
AST: 17 U/L (ref 15–41)
Albumin: 3.7 g/dL (ref 3.5–5.0)
Alkaline Phosphatase: 43 U/L (ref 38–126)
Anion gap: 12 (ref 5–15)
BUN: 14 mg/dL (ref 8–23)
CO2: 27 mmol/L (ref 22–32)
Calcium: 9.6 mg/dL (ref 8.9–10.3)
Chloride: 103 mmol/L (ref 98–111)
Creatinine, Ser: 1.05 mg/dL — ABNORMAL HIGH (ref 0.44–1.00)
GFR, Estimated: 56 mL/min — ABNORMAL LOW (ref >60)
Glucose, Bld: 191 mg/dL — ABNORMAL HIGH (ref 70–99)
Potassium: 4.4 mmol/L (ref 3.5–5.1)
Sodium: 142 mmol/L (ref 135–145)
Total Bilirubin: 0.9 mg/dL (ref 0.0–1.2)
Total Protein: 6.4 g/dL — ABNORMAL LOW (ref 6.5–8.1)

## 2024-02-15 LAB — CBC
HCT: 41.2 % (ref 36.0–46.0)
Hemoglobin: 14.1 g/dL (ref 12.0–15.0)
MCH: 31.9 pg (ref 26.0–34.0)
MCHC: 34.2 g/dL (ref 30.0–36.0)
MCV: 93.2 fL (ref 80.0–100.0)
Platelets: 148 K/uL — ABNORMAL LOW (ref 150–400)
RBC: 4.42 MIL/uL (ref 3.87–5.11)
RDW: 13.2 % (ref 11.5–15.5)
WBC: 5.7 K/uL (ref 4.0–10.5)
nRBC: 0 % (ref 0.0–0.2)

## 2024-02-16 ENCOUNTER — Ambulatory Visit (HOSPITAL_COMMUNITY): Payer: Self-pay | Admitting: Family Medicine

## 2024-02-27 ENCOUNTER — Encounter: Payer: Self-pay | Admitting: Emergency Medicine

## 2024-02-27 ENCOUNTER — Other Ambulatory Visit: Payer: Self-pay

## 2024-02-27 ENCOUNTER — Ambulatory Visit
Admission: EM | Admit: 2024-02-27 | Discharge: 2024-02-27 | Disposition: A | Discharge status: Home / Self Care | Attending: Family Medicine | Admitting: Family Medicine

## 2024-02-27 DIAGNOSIS — R5383 Other fatigue: Secondary | ICD-10-CM

## 2024-02-27 DIAGNOSIS — R4189 Other symptoms and signs involving cognitive functions and awareness: Secondary | ICD-10-CM | POA: Diagnosis not present

## 2024-02-27 LAB — POCT URINE DIPSTICK
Bilirubin, UA: NEGATIVE
Blood, UA: NEGATIVE
Glucose, UA: >=1000 mg/dL — AB
Ketones, POC UA: NEGATIVE mg/dL
Leukocytes, UA: NEGATIVE
Nitrite, UA: NEGATIVE
POC PROTEIN,UA: NEGATIVE
Spec Grav, UA: 1.01 (ref 1.010–1.025)
Urobilinogen, UA: 0.2 U/dL
pH, UA: 5.5 (ref 5.0–8.0)

## 2024-02-27 LAB — POC SOFIA SARS ANTIGEN FIA: SARS Coronavirus 2 Ag: NEGATIVE

## 2024-02-27 NOTE — ED Triage Notes (Signed)
 Pt reports urinary frequency, general malaise, nausea since yesterday. Denies taking anything otc. denies known fevers.

## 2024-02-27 NOTE — Discharge Instructions (Signed)
 Your urine test and COVID test were both negative today, we have sent out some labs for further evaluation and we will let you know if anything comes back abnormal.  Drink plenty of fluids, get lots of rest and follow-up for worsening or unresolving symptoms.

## 2024-02-28 ENCOUNTER — Ambulatory Visit (HOSPITAL_COMMUNITY): Payer: Self-pay

## 2024-02-28 LAB — BASIC METABOLIC PANEL WITH GFR
BUN/Creatinine Ratio: 16 (ref 12–28)
BUN: 12 mg/dL (ref 8–27)
CO2: 22 mmol/L (ref 20–29)
Calcium: 9.6 mg/dL (ref 8.7–10.3)
Chloride: 102 mmol/L (ref 96–106)
Creatinine, Ser: 0.73 mg/dL (ref 0.57–1.00)
Glucose: 123 mg/dL — ABNORMAL HIGH (ref 70–99)
Potassium: 4.5 mmol/L (ref 3.5–5.2)
Sodium: 142 mmol/L (ref 134–144)
eGFR: 86 mL/min/1.73 (ref >59)

## 2024-02-28 LAB — CBC WITH DIFFERENTIAL/PLATELET
Basophils Absolute: 0.1 x10E3/uL (ref 0.0–0.2)
Basos: 1 %
EOS (ABSOLUTE): 0.5 x10E3/uL — ABNORMAL HIGH (ref 0.0–0.4)
Eos: 7 %
Hematocrit: 41.6 % (ref 34.0–46.6)
Hemoglobin: 14 g/dL (ref 11.1–15.9)
Immature Grans (Abs): 0 x10E3/uL (ref 0.0–0.1)
Immature Granulocytes: 0 %
Lymphocytes Absolute: 1.4 x10E3/uL (ref 0.7–3.1)
Lymphs: 22 %
MCH: 31.7 pg (ref 26.6–33.0)
MCHC: 33.7 g/dL (ref 31.5–35.7)
MCV: 94 fL (ref 79–97)
Monocytes Absolute: 0.2 x10E3/uL (ref 0.1–0.9)
Monocytes: 4 %
Neutrophils Absolute: 4.5 x10E3/uL (ref 1.4–7.0)
Neutrophils: 66 %
Platelets: 178 x10E3/uL (ref 150–450)
RBC: 4.41 x10E6/uL (ref 3.77–5.28)
RDW: 12.3 % (ref 11.7–15.4)
WBC: 6.7 x10E3/uL (ref 3.4–10.8)

## 2024-02-29 NOTE — ED Provider Notes (Signed)
 [facility]    CSN: [id number] Arrival date & time: 02/27/24  1302      History   Chief Complaint Chief Complaint  Patient presents with   Urinary Frequency    HPI [name] is a 73 y.o. female.    Pt reports urinary frequency, general malaise, nausea since yesterday. Denies taking anything otc. denies known fevers.        Past Medical History:  Diagnosis Date   Atypical nevi 04/01/2014   Glaucoma    Hyperlipidemia    Hypertension    Nonischemic cardiomyopathy (HCC)    PONV (postoperative nausea and vomiting)    Proximal humerus fracture    Right   Sarcoidosis    Type 2 diabetes mellitus (HCC)    Varicose veins     Patient Active Problem List   Diagnosis Date Noted   FH: colon cancer 05/02/2023   Hypotension 03/07/2022   Urinary frequency 08/02/2021   Bad odor of urine 08/02/2021   New onset atrial fibrillation (HCC) 03/24/2021   SOB (shortness of breath) 03/24/2021   Acute CHF (congestive heart failure) (HCC) 03/24/2021   Systolic and diastolic CHF, acute (HCC) 03/24/2021   Injury of breast 01/06/2021   Mass of upper outer quadrant of right breast 01/06/2021   Memory difficulties 01/06/2021   Encounter for screening fecal occult blood testing 11/25/2020   Encounter for well woman exam with routine gynecological exam 11/25/2020   Screening for colorectal cancer 11/20/2019   Encounter for gynecological examination with Papanicolaou smear of cervix 11/20/2019   Routine cervical smear 11/20/2019   Vulvar dystrophy 04/27/2017   Lichen sclerosus et atrophicus 12/02/2016   Encounter for colorectal cancer screening 12/02/2016   Vaginal dryness 04/01/2014   Atypical nevi 04/01/2014   Varicose veins of leg with pain 03/27/2014    Past Surgical History:  Procedure Laterality Date   APPENDECTOMY     BIOPSY  09/27/2017   Procedure: BIOPSY;  Surgeon: Shaaron Lamar HERO, MD;  Location: [facility];  Service: Endoscopy;;  ileocecal valve    CESAREAN SECTION     X 3   COLONOSCOPY     COLONOSCOPY  07/23/2012   Procedure: COLONOSCOPY;  Surgeon: Lamar HERO Shaaron, MD;  Location: [facility];  Service: Endoscopy;  Laterality: N/A;  7:30 AM   COLONOSCOPY N/A 09/27/2017   Procedure: COLONOSCOPY;  Surgeon: Shaaron Lamar HERO, MD;  Location: [facility];  Service: Endoscopy;  Laterality: N/A;  8:30   COLONOSCOPY WITH PROPOFOL  N/A 07/26/2023   Procedure: COLONOSCOPY WITH PROPOFOL ;  Surgeon: Shaaron Lamar HERO, MD;  Location: [facility];  Service: Endoscopy;  Laterality: N/A;  8:15 am, asa 3   RIGHT/LEFT HEART CATH AND CORONARY ANGIOGRAPHY N/A 03/25/2021   Procedure: RIGHT/LEFT HEART CATH AND CORONARY ANGIOGRAPHY;  Surgeon: Court Dorn PARAS, MD;  Location: [facility];  Service: Cardiovascular;  Laterality: N/A;   TONSILLECTOMY     TUBAL LIGATION      OB History     Gravida  4   Para  4   Term  3   Preterm  1   AB      Living  3      SAB      IAB      Ectopic      Multiple      Live Births  3            Home Medications    Prior to Admission medications  Medication Sig Start Date End Date Taking? Authorizing Provider  ACCU-CHEK GUIDE test strip daily. 04/04/23   [provider]  aspirin  EC 81 MG tablet Take 1 tablet (81 mg total) by mouth at bedtime. 11/08/23   Milford, Harlene HERO, FNP  brimonidine -timolol  (COMBIGAN ) 0.2-0.5 % ophthalmic solution INSTILL ONE DROP IN BOTH  EYES TWO TIMES DAILY 10/28/16   [provider]  calcium -vitamin D  (OSCAL WITH D) 500-200 MG-UNIT tablet Take 2 tablets by mouth daily with breakfast. 03/28/21   Meng, Hao, PA  carvedilol  (COREG ) 3.125 MG tablet Take 1 tablet (3.125 mg total) by mouth 2 (two) times daily. 11/28/23   Rolan Ezra RAMAN, MD  dorzolamide  (TRUSOPT ) 2 % ophthalmic solution Place 1 drop into both eyes 2 (two) times daily. 07/08/17   [provider]  empagliflozin  (JARDIANCE ) 10 MG TABS tablet Take 1 tablet (10 mg total) by mouth daily. 11/08/23    Glena Harlene HERO, FNP  folic acid  (FOLVITE ) 1 MG tablet TAKE 1 TABLET EVERY DAY 10/17/22   McLean, Dalton S, MD  gabapentin  (NEURONTIN ) 300 MG capsule Take 300 mg by mouth daily. 02/18/21   [provider]  glimepiride  (AMARYL ) 1 MG tablet Take 1 mg by mouth daily before breakfast.    [provider]  latanoprost  (XALATAN ) 0.005 % ophthalmic solution Place 1 drop into both eyes 2 (two) times daily.    [provider]  Magnesium  400 MG TABS Take 400 mg by mouth daily. 05/28/21   Rolan Ezra RAMAN, MD  metFORMIN (GLUCOPHAGE) 1000 MG tablet Take 1,000 mg by mouth 2 (two) times daily.    [provider]  methotrexate  (RHEUMATREX) 2.5 MG tablet Take 8 tablets (20 mg total) by mouth once a week. Needs follow up appointment for more refills. 11/08/23   Glena Harlene HERO, FNP  Multiple Vitamin (MULTI-VITAMIN) tablet Take 1 tablet by mouth. 06/09/15   [provider]  ondansetron  (ZOFRAN -ODT) 4 MG disintegrating tablet Take 1 tablet (4 mg total) by mouth every 8 (eight) hours as needed for nausea or vomiting. 10/01/23   Dean Clarity, MD  pantoprazole  (PROTONIX ) 40 MG tablet TAKE 1 TABLET EVERY EVENING 10/17/22   Rolan Ezra RAMAN, MD  sacubitril -valsartan  (ENTRESTO ) 24-26 MG Take 1 tablet by mouth 2 (two) times daily. 11/08/23   Milford, Harlene HERO, FNP  spironolactone  (ALDACTONE ) 25 MG tablet Take 0.5 tablets (12.5 mg total) by mouth at bedtime. 11/28/23   Rolan Ezra RAMAN, MD    Family History Family History  Problem Relation Age of Onset   Varicose Veins Mother    Deep vein thrombosis Mother    Cancer Father    Varicose Veins Father    Deep vein thrombosis Father    Diabetes Father    Heart disease Father    Hypertension Father    Diabetes Sister    Heart disease Sister    Hyperlipidemia Sister    Hypertension Sister    Hypertension Son    Colon cancer Brother    Cancer Brother        stomach   Varicose Veins Daughter    Hypertension Son     Miscarriages / [place] Daughter     Social History Social History   Tobacco Use   Smoking status: Never   Smokeless tobacco: Never  Vaping Use   Vaping status: Never Used  Substance Use Topics   Alcohol use: No   Drug use: No     Allergies   Macrobid [nitrofurantoin macrocrystal], Codeine, Sulfa   antibiotics, Cefaclor, and Ciprofloxacin   Review of Systems Review of Systems PER HPI  Physical Exam Triage Vital Signs ED Triage Vitals  Encounter Vitals Group     BP 02/27/24 1320 99/66     Girls Systolic BP Percentile --      Girls Diastolic BP Percentile --      Boys Systolic BP Percentile --      Boys Diastolic BP Percentile --      Pulse Rate 02/27/24 1320 (!) 57     Resp 02/27/24 1320 20     Temp 02/27/24 1320 97.7 F (36.5 C)     Temp Source 02/27/24 1320 Oral     SpO2 02/27/24 1320 95 %     Weight --      Height --      Head Circumference --      Peak Flow --      Pain Score 02/27/24 1322 0     Pain Loc --      Pain Education --      Exclude from Growth Chart --    No data found.  Updated Vital Signs BP 99/66 (BP Location: Right Arm)   Pulse (!) 57   Temp 97.7 F (36.5 C) (Oral)   Resp 20   SpO2 95%   Visual Acuity Right Eye Distance:   Left Eye Distance:   Bilateral Distance:    Right Eye Near:   Left Eye Near:    Bilateral Near:     Physical Exam Vitals and nursing note reviewed.  Constitutional:      Appearance: Normal appearance. She is not ill-appearing.  HENT:     Head: Atraumatic.     Mouth/Throat:     Mouth: Mucous membranes are moist.  Eyes:     Extraocular Movements: Extraocular movements intact.     Conjunctiva/sclera: Conjunctivae normal.     Pupils: Pupils are equal, round, and reactive to light.  Cardiovascular:     Rate and Rhythm: Normal rate and regular rhythm.     Heart sounds: Normal heart sounds.  Pulmonary:     Effort: Pulmonary effort is normal.     Breath sounds: Normal breath sounds.  Abdominal:      General: Bowel sounds are normal. There is no distension.     Palpations: Abdomen is soft.     Tenderness: There is no abdominal tenderness. There is no right CVA tenderness, left CVA tenderness or guarding.  Musculoskeletal:        General: Normal range of motion.     Cervical back: Normal range of motion and neck supple.  Skin:    General: Skin is warm and dry.  Neurological:     General: No focal deficit present.     Mental Status: She is alert and oriented to person, place, and time.     Cranial Nerves: No cranial nerve deficit.     Motor: No weakness.     Gait: Gait normal.  Psychiatric:        Mood and Affect: Mood normal.        Thought Content: Thought content normal.        Judgment: Judgment normal.      UC Treatments / Results  Labs (all labs ordered are listed, but only abnormal results are displayed) Labs Reviewed  CBC WITH DIFFERENTIAL/PLATELET - Abnormal; Notable for the following components:      Result Value   EOS (ABSOLUTE) 0.5 (*)    All other components  within normal limits   Narrative:    Performed at:  [address] [facility] [address], [place], [place]  [id number] Lab Director: Frankey Sas MD, Phone:  [phone]  BASIC METABOLIC PANEL WITH GFR - Abnormal; Notable for the following components:   Glucose 123 (*)    All other components within normal limits   Narrative:    Performed at:  [address], [place], [place]  [id number] Lab Director: Frankey Sas MD, Phone:  [phone]  POCT URINE DIPSTICK - Abnormal; Notable for the following components:   Glucose, UA >=1,000 (*)    All other components within normal limits  POC SOFIA SARS ANTIGEN FIA    EKG   Radiology No results found.  Procedures Procedures (including critical care time)  Medications Ordered in UC Medications - No data to display  Initial Impression / Assessment and Plan / UC Course  I have reviewed the triage vital signs and the nursing  notes.  Pertinent labs & imaging results that were available during my care of the patient were reviewed by me and considered in my medical decision making (see chart for details).     Vital signs overall reassuring today, she is well-appearing and in no acute distress.  COVID test was negative. Her urinalysis does not indicate a urinary tract infection, exam very reassuring with no concerning features.  Labs pending for further evaluation but did discuss importance of primary care follow-up as soon as possible for a recheck and further evaluation if symptoms not improving.  Emergency department for significantly worsening symptoms at any time.  Final Clinical Impressions(s) / UC Diagnoses   Final diagnoses:  Other fatigue  Brain fog     Discharge Instructions      Your urine test and COVID test were both negative today, we have sent out some labs for further evaluation and we will let you know if anything comes back abnormal.  Drink plenty of fluids, get lots of rest and follow-up for worsening or unresolving symptoms.    ED Prescriptions   None    PDMP not reviewed this encounter.   Stuart Vernell Norris, [place] 02/29/24 1935

## 2024-04-04 ENCOUNTER — Encounter: Payer: Self-pay | Admitting: *Deleted

## 2024-04-04 DIAGNOSIS — F320 Major depressive disorder, single episode, mild: Secondary | ICD-10-CM | POA: Diagnosis not present

## 2024-04-04 DIAGNOSIS — Z23 Encounter for immunization: Secondary | ICD-10-CM | POA: Diagnosis not present

## 2024-04-04 NOTE — Progress Notes (Signed)
 [name]                                          MRN: [id number]   04/04/2024   The [facility] Quality Team Specialist reviewed this patient medical record for the purposes of chart review for care gap closure. The following were reviewed: chart review for care gap closure-kidney health evaluation for diabetes:eGFR  and uACR.    VBCI Quality Team

## 2024-04-16 ENCOUNTER — Ambulatory Visit: Admitting: Adult Health

## 2024-04-25 DIAGNOSIS — I5022 Chronic systolic (congestive) heart failure: Secondary | ICD-10-CM | POA: Diagnosis not present

## 2024-04-25 DIAGNOSIS — Z79899 Other long term (current) drug therapy: Secondary | ICD-10-CM | POA: Diagnosis not present

## 2024-04-25 DIAGNOSIS — I10 Essential (primary) hypertension: Secondary | ICD-10-CM | POA: Diagnosis not present

## 2024-04-25 DIAGNOSIS — E785 Hyperlipidemia, unspecified: Secondary | ICD-10-CM | POA: Diagnosis not present

## 2024-05-06 DIAGNOSIS — E1129 Type 2 diabetes mellitus with other diabetic kidney complication: Secondary | ICD-10-CM | POA: Diagnosis not present

## 2024-05-06 DIAGNOSIS — E785 Hyperlipidemia, unspecified: Secondary | ICD-10-CM | POA: Diagnosis not present

## 2024-05-06 DIAGNOSIS — F321 Major depressive disorder, single episode, moderate: Secondary | ICD-10-CM | POA: Diagnosis not present

## 2024-05-16 DIAGNOSIS — H33001 Unspecified retinal detachment with retinal break, right eye: Secondary | ICD-10-CM | POA: Diagnosis not present

## 2024-05-16 DIAGNOSIS — H442E3 Degenerative myopia with other maculopathy, bilateral eye: Secondary | ICD-10-CM | POA: Diagnosis not present

## 2024-05-16 DIAGNOSIS — H2703 Aphakia, bilateral: Secondary | ICD-10-CM | POA: Diagnosis not present

## 2024-05-16 DIAGNOSIS — E119 Type 2 diabetes mellitus without complications: Secondary | ICD-10-CM | POA: Diagnosis not present

## 2024-05-16 DIAGNOSIS — H401133 Primary open-angle glaucoma, bilateral, severe stage: Secondary | ICD-10-CM | POA: Diagnosis not present

## 2024-05-16 DIAGNOSIS — H35372 Puckering of macula, left eye: Secondary | ICD-10-CM | POA: Diagnosis not present

## 2024-05-19 ENCOUNTER — Emergency Department (HOSPITAL_COMMUNITY)
Admission: EM | Admit: 2024-05-19 | Discharge: 2024-05-19 | Disposition: A | Discharge status: Home / Self Care | Attending: Emergency Medicine | Admitting: Emergency Medicine

## 2024-05-19 ENCOUNTER — Other Ambulatory Visit: Payer: Self-pay

## 2024-05-19 DIAGNOSIS — E119 Type 2 diabetes mellitus without complications: Secondary | ICD-10-CM | POA: Diagnosis not present

## 2024-05-19 DIAGNOSIS — Z79899 Other long term (current) drug therapy: Secondary | ICD-10-CM | POA: Diagnosis not present

## 2024-05-19 DIAGNOSIS — Z7982 Long term (current) use of aspirin: Secondary | ICD-10-CM | POA: Diagnosis not present

## 2024-05-19 DIAGNOSIS — I959 Hypotension, unspecified: Secondary | ICD-10-CM | POA: Diagnosis not present

## 2024-05-19 DIAGNOSIS — I10 Essential (primary) hypertension: Secondary | ICD-10-CM | POA: Insufficient documentation

## 2024-05-19 DIAGNOSIS — R110 Nausea: Secondary | ICD-10-CM | POA: Diagnosis not present

## 2024-05-19 DIAGNOSIS — I5022 Chronic systolic (congestive) heart failure: Secondary | ICD-10-CM | POA: Diagnosis not present

## 2024-05-19 DIAGNOSIS — Z7984 Long term (current) use of oral hypoglycemic drugs: Secondary | ICD-10-CM | POA: Diagnosis not present

## 2024-05-19 DIAGNOSIS — R112 Nausea with vomiting, unspecified: Secondary | ICD-10-CM | POA: Diagnosis not present

## 2024-05-19 DIAGNOSIS — R0689 Other abnormalities of breathing: Secondary | ICD-10-CM | POA: Diagnosis not present

## 2024-05-19 DIAGNOSIS — R42 Dizziness and giddiness: Secondary | ICD-10-CM | POA: Diagnosis not present

## 2024-05-19 DIAGNOSIS — R001 Bradycardia, unspecified: Secondary | ICD-10-CM | POA: Diagnosis not present

## 2024-05-19 LAB — CBC
HCT: 40.7 % (ref 36.0–46.0)
Hemoglobin: 13.3 g/dL (ref 12.0–15.0)
MCH: 31.2 pg (ref 26.0–34.0)
MCHC: 32.7 g/dL (ref 30.0–36.0)
MCV: 95.5 fL (ref 80.0–100.0)
Platelets: 113 K/uL — ABNORMAL LOW (ref 150–400)
RBC: 4.26 MIL/uL (ref 3.87–5.11)
RDW: 13.7 % (ref 11.5–15.5)
WBC: 5.9 K/uL (ref 4.0–10.5)
nRBC: 0 % (ref 0.0–0.2)

## 2024-05-19 LAB — URINALYSIS, ROUTINE W REFLEX MICROSCOPIC
Bacteria, UA: NONE SEEN
Bilirubin Urine: NEGATIVE
Glucose, UA: >=500 mg/dL — AB
Hgb urine dipstick: NEGATIVE
Ketones, ur: 5 mg/dL — AB
Leukocytes,Ua: NEGATIVE
Nitrite: NEGATIVE
Protein, ur: NEGATIVE mg/dL
Specific Gravity, Urine: 1.025 (ref 1.005–1.030)
pH: 7 (ref 5.0–8.0)

## 2024-05-19 LAB — COMPREHENSIVE METABOLIC PANEL WITH GFR
ALT: 15 U/L (ref 0–44)
AST: 22 U/L (ref 15–41)
Albumin: 4.1 g/dL (ref 3.5–5.0)
Alkaline Phosphatase: 48 U/L (ref 38–126)
Anion gap: 9 (ref 5–15)
BUN: 15 mg/dL (ref 8–23)
CO2: 27 mmol/L (ref 22–32)
Calcium: 9.2 mg/dL (ref 8.9–10.3)
Chloride: 107 mmol/L (ref 98–111)
Creatinine, Ser: 0.66 mg/dL (ref 0.44–1.00)
GFR, Estimated: >60 mL/min (ref >60)
Glucose, Bld: 114 mg/dL — ABNORMAL HIGH (ref 70–99)
Potassium: 4.2 mmol/L (ref 3.5–5.1)
Sodium: 143 mmol/L (ref 135–145)
Total Bilirubin: 0.7 mg/dL (ref 0.0–1.2)
Total Protein: 6 g/dL — ABNORMAL LOW (ref 6.5–8.1)

## 2024-05-19 LAB — LIPASE, BLOOD: Lipase: 38 U/L (ref 11–51)

## 2024-05-19 MED ORDER — ONDANSETRON HCL 4 MG PO TABS
4.0000 mg | ORAL_TABLET | Freq: Four times a day (QID) | ORAL | 0 refills | Status: AC
Start: 2024-05-19 — End: ?

## 2024-05-19 NOTE — ED Triage Notes (Signed)
 PT BIB EMS pt was walking out of church and started to get nauseated and vomited 1 time. Was given 4mg  zofran  by EMS and nausea has improved. PT denies falling or passing out. Was feeling fine prior to going to church but did not eat anything.

## 2024-05-19 NOTE — Discharge Instructions (Signed)
 Zofran is a medication which can help with nausea.  You may take 4 mg by mouth every 6 hours as needed if you are an adult, if your child under the age of 6 take half of a tablet or 2 mg every 6 hours as needed.  This should dissolve on your tongue within a short timeframe.  Wait about 30 minutes after taking it to help with drinking clear liquids.  Thank you for allowing [place] to treat you in the emergency department today.  After reviewing your examination and potential testing that was done it appears that you are safe to go home.  I would like for you to follow-up with your doctor within the next several days, have them obtain your records and follow-up with them to review all potential tests and results from your visit.  If you should develop severe or worsening symptoms return to the emergency department immediately

## 2024-05-19 NOTE — ED Provider Notes (Signed)
 [place] [facility] AT [facility] Provider Note   CSN: [id number] Arrival date & time: 05/19/24  1213     Patient presents with: Emesis and Dizziness   [name] is a 73 y.o. female.    Emesis Dizziness Associated symptoms: vomiting    This patient is a 73 year old female, she has a history of hypertension and diabetes as well as acid reflux.  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  The patient presents today with a complaint of nausea and vomiting, she was at church, finished the entire service, stepped outside of the [place] and was speaking with the pastor (her son) when she became acutely nauseated and had to sit down.  She did have 1 episode of vomiting.  She did not feel dizzy lightheaded or near syncopal, she did not have chest pain shortness of breath palpitations and has not had diarrhea or blood in the stool dysuria swelling of the legs tick bites rashes abdominal pain nor has she had anything to eat today.  She was given Zofran  by EMS and route to the hospital, vital signs were unremarkable    Prior to Admission medications   Medication Sig Start Date End Date Taking? Authorizing Provider  ondansetron  (ZOFRAN ) 4 MG tablet Take 1 tablet (4 mg total) by mouth every 6 (six) hours. 05/19/24  Yes Cleotilde Rogue, MD  ACCU-CHEK GUIDE test strip daily. 04/04/23   [provider]  aspirin  EC 81 MG tablet Take 1 tablet (81 mg total) by mouth at bedtime. 11/08/23   Milford, Harlene HERO, FNP  brimonidine -timolol  (COMBIGAN ) 0.2-0.5 % ophthalmic solution INSTILL ONE DROP IN BOTH  EYES TWO TIMES DAILY 10/28/16   [provider]  calcium -vitamin D  (OSCAL WITH  D) 500-200 MG-UNIT tablet Take 2 tablets by mouth daily with breakfast. 03/28/21   Meng, Hao, PA  carvedilol  (COREG ) 3.125 MG tablet Take 1 tablet (3.125 mg total) by mouth 2 (two) times daily. 11/28/23   Rolan Ezra RAMAN, MD  dorzolamide  (TRUSOPT ) 2 % ophthalmic solution Place 1 drop into both eyes 2 (two) times daily. 07/08/17   [provider]  empagliflozin  (JARDIANCE ) 10 MG TABS tablet Take 1 tablet (10 mg total) by mouth daily. 11/08/23   Glena Harlene HERO, FNP  folic acid  (FOLVITE ) 1 MG tablet TAKE 1 TABLET EVERY DAY 10/17/22   Rolan Ezra RAMAN, MD  gabapentin  (NEURONTIN ) 300 MG capsule Take 300 mg by mouth daily. 02/18/21   [provider]  glimepiride  (AMARYL ) 1 MG tablet Take 1 mg by mouth daily before breakfast.    [provider]  latanoprost  (XALATAN ) 0.005 % ophthalmic solution Place 1 drop into both eyes 2 (two) times daily.    [provider]  Magnesium  400 MG TABS Take 400 mg by mouth daily. 05/28/21   Rolan Ezra RAMAN, MD  metFORMIN (GLUCOPHAGE) 1000 MG tablet Take 1,000 mg by mouth 2 (two) times daily.    [provider]  methotrexate  (RHEUMATREX) 2.5 MG tablet Take 8 tablets (20 mg total) by mouth once a week. Needs follow up appointment for more refills. 11/08/23   Glena Harlene HERO, FNP  Multiple Vitamin (MULTI-VITAMIN) tablet Take 1 tablet by mouth. 06/09/15  [provider]  ondansetron  (ZOFRAN -ODT) 4 MG disintegrating tablet Take 1 tablet (4 mg total) by mouth every 8 (eight) hours as needed for nausea or vomiting. 10/01/23   Dean Clarity, MD  pantoprazole  (PROTONIX ) 40 MG tablet TAKE 1 TABLET EVERY EVENING 10/17/22   McLean, Dalton S, MD  sacubitril -valsartan  (ENTRESTO ) 24-26 MG Take 1 tablet by mouth 2 (two) times daily. 11/08/23   Milford, Harlene HERO, FNP  spironolactone  (ALDACTONE ) 25 MG tablet Take 0.5 tablets (12.5 mg total) by mouth at bedtime. 11/28/23   Rolan Ezra RAMAN, MD    Allergies: Macrobid [nitrofurantoin  macrocrystal], Codeine, Sulfa  antibiotics, Cefaclor, and Ciprofloxacin    Review of Systems  Gastrointestinal:  Positive for vomiting.  Neurological:  Positive for dizziness.  All other systems reviewed and are negative.   Updated Vital Signs BP (!) 101/58   Pulse (!) 57   Temp 97.9 F (36.6 C) (Oral)   Resp 14   Ht 1.702 m (5' 7)   Wt 87.3 kg   SpO2 92%   BMI 30.14 kg/m   Physical Exam Vitals and nursing note reviewed.  Constitutional:      General: She is not in acute distress.    Appearance: She is well-developed.  HENT:     Head: Normocephalic and atraumatic.     Mouth/Throat:     Pharynx: No oropharyngeal exudate.  Eyes:     General: No scleral icterus.       Right eye: No discharge.        Left eye: No discharge.     Conjunctiva/sclera: Conjunctivae normal.     Pupils: Pupils are equal, round, and reactive to light.  Neck:     Thyroid : No thyromegaly.     Vascular: No JVD.  Cardiovascular:     Rate and Rhythm: Normal rate and regular rhythm.     Heart sounds: Normal heart sounds. No murmur heard.    No friction rub. No gallop.  Pulmonary:     Effort: Pulmonary effort is normal. No respiratory distress.     Breath sounds: Normal breath sounds. No wheezing or rales.  Abdominal:     General: Bowel sounds are normal. There is no distension.     Palpations: Abdomen is soft. There is no mass.     Tenderness: There is no abdominal tenderness.  Musculoskeletal:        General: No tenderness. Normal range of motion.     Cervical back: Normal range of motion and neck supple.     Right lower leg: No edema.     Left lower leg: No edema.  Lymphadenopathy:     Cervical: No cervical adenopathy.  Skin:    General: Skin is warm and dry.     Findings: No erythema or rash.  Neurological:     General: No focal deficit present.     Mental Status: She is alert.     Coordination: Coordination normal.     Comments: This patient has normal speech, normal coordination in  all 4 extremities including finger-nose-finger bilaterally.  There is no pronator drift, she has symmetrical 5 out of 5 strength in the arms and the legs.  Cranial nerves III through XII are normal, she answers questions appropriately and reports that her vision is at its baseline.  There is no nystagmus  Psychiatric:        Behavior: Behavior normal.     (all labs ordered are listed, but only abnormal results are displayed) Labs Reviewed  COMPREHENSIVE METABOLIC PANEL WITH GFR - Abnormal; Notable for the following components:      Result Value   Glucose, Bld 114 (*)    Total Protein 6.0 (*)    All other components within normal limits  CBC - Abnormal; Notable for the following components:   Platelets 113 (*)    All other components within normal limits  URINALYSIS, ROUTINE W REFLEX MICROSCOPIC - Abnormal; Notable for the following components:   Glucose, UA >=500 (*)    Ketones, ur 5 (*)    All other components within normal limits  LIPASE, BLOOD    EKG: EKG Interpretation Date/Time:  "Sunday May 19 2024 12:40:01 EST Ventricular Rate:  54 PR Interval:    QRS Duration:  131 QT Interval:  485 QTC Calculation: 460 R Axis:   -64  Text Interpretation: Normal sinus rhythm Nonspecific IVCD with LAD Borderline T abnormalities, anterior leads since last tracing no significant change Confirmed by Vinnie Gombert ([id number]) on 05/19/2024 12:50:11 PM  Radiology: No results found.   Procedures   Medications Ordered in the ED - No data to display  Clinical Course as of 05/19/24 1512  Sun May 19, 2024  1412 Patient reevaluated and states that she is now symptom-free with no nausea, no vomiting, no pain, vital signs reviewed at the bedside and are totally normal.  He is ambulating to the bathroom to get a urine sample right now [BM]    Clinical Course User Index [BM] Dunbar Buras, MD                                 Medical Decision Making Amount and/or Complexity of Data  Reviewed Labs: ordered.  Risk Prescription drug management.    This patient presents to the ED for concern of nausea and vomiting differential diagnosis includes viral syndrome, food poisoning, could be related to some other underlying conditions such as cardiac ischemia, arrhythmia, she does not have any signs of stroke or any neurological plaints.  She has not had any recent new medications or changes in dosing, could be endocrine related such as hypoglycemia    Additional history obtained   Additional history obtained from Electronic Medical Record External records from outside source obtained and reviewed including medical record including prior office visits   Lab Tests:  I Ordered, and personally interpreted labs.  The pertinent results include: CBC metabolic panel and urinalysis all very unremarkable   Before the patient could be given IV fluids or medication she had resolved and has no symptoms whatsoever   Problem List / ED Course:  Exam unremarkable lab workup unremarkable vital signs unremarkable and patient is asymptomatic, EKG shows no findings of heart problems   Social Determinants of Health:  None  With family at the bedside I was given permission to speak about the patient's results, there was no abnormal findings, they are agreeable to discharge, Zofran as needed        Final diagnoses:  Nausea and vomiting, unspecified vomiting type    ED Discharge Orders          Ordered    ondansetron (ZOFRAN) 4 MG tablet  Every 6 hours        11" /23/25 1511               Cleotilde Rogue, MD 05/19/24 1512

## 2024-05-20 DIAGNOSIS — L821 Other seborrheic keratosis: Secondary | ICD-10-CM | POA: Diagnosis not present

## 2024-05-21 DIAGNOSIS — Z79899 Other long term (current) drug therapy: Secondary | ICD-10-CM | POA: Diagnosis not present

## 2024-05-21 DIAGNOSIS — E1129 Type 2 diabetes mellitus with other diabetic kidney complication: Secondary | ICD-10-CM | POA: Diagnosis not present

## 2024-06-04 DIAGNOSIS — J309 Allergic rhinitis, unspecified: Secondary | ICD-10-CM | POA: Diagnosis not present

## 2024-06-04 DIAGNOSIS — R55 Syncope and collapse: Secondary | ICD-10-CM | POA: Diagnosis not present

## 2024-06-04 DIAGNOSIS — R110 Nausea: Secondary | ICD-10-CM | POA: Diagnosis not present

## 2024-06-14 ENCOUNTER — Other Ambulatory Visit (HOSPITAL_COMMUNITY): Payer: Self-pay | Admitting: Cardiology

## 2024-06-25 ENCOUNTER — Telehealth (HOSPITAL_COMMUNITY): Payer: Self-pay

## 2024-06-25 ENCOUNTER — Ambulatory Visit (HOSPITAL_COMMUNITY)
Admission: RE | Admit: 2024-06-25 | Discharge: 2024-06-25 | Disposition: A | Discharge status: Home / Self Care | Source: Ambulatory Visit | Attending: Cardiology | Admitting: Cardiology

## 2024-06-25 ENCOUNTER — Encounter (HOSPITAL_COMMUNITY): Payer: Self-pay

## 2024-06-25 VITALS — BP 98/60 | HR 62 | Wt 178.2 lb

## 2024-06-25 DIAGNOSIS — Z7984 Long term (current) use of oral hypoglycemic drugs: Secondary | ICD-10-CM | POA: Diagnosis not present

## 2024-06-25 DIAGNOSIS — D8685 Sarcoid myocarditis: Secondary | ICD-10-CM | POA: Diagnosis not present

## 2024-06-25 DIAGNOSIS — D860 Sarcoidosis of lung: Secondary | ICD-10-CM | POA: Insufficient documentation

## 2024-06-25 DIAGNOSIS — I428 Other cardiomyopathies: Secondary | ICD-10-CM | POA: Insufficient documentation

## 2024-06-25 DIAGNOSIS — I493 Ventricular premature depolarization: Secondary | ICD-10-CM | POA: Diagnosis not present

## 2024-06-25 DIAGNOSIS — I5022 Chronic systolic (congestive) heart failure: Secondary | ICD-10-CM | POA: Diagnosis not present

## 2024-06-25 DIAGNOSIS — I110 Hypertensive heart disease with heart failure: Secondary | ICD-10-CM | POA: Diagnosis not present

## 2024-06-25 DIAGNOSIS — I4719 Other supraventricular tachycardia: Secondary | ICD-10-CM | POA: Insufficient documentation

## 2024-06-25 DIAGNOSIS — E119 Type 2 diabetes mellitus without complications: Secondary | ICD-10-CM | POA: Insufficient documentation

## 2024-06-25 DIAGNOSIS — Z79899 Other long term (current) drug therapy: Secondary | ICD-10-CM | POA: Insufficient documentation

## 2024-06-25 DIAGNOSIS — E785 Hyperlipidemia, unspecified: Secondary | ICD-10-CM | POA: Diagnosis not present

## 2024-06-25 LAB — ECHOCARDIOGRAM COMPLETE
AR max vel: 2.41 cm2
AV Area VTI: 2.64 cm2
AV Area mean vel: 2.18 cm2
AV Mean grad: 4 mmHg
AV Peak grad: 7.2 mmHg
Ao pk vel: 1.34 m/s
Area-P 1/2: 2.46 cm2
Calc EF: 58.4 %
S' Lateral: 3.4 cm
Single Plane A2C EF: 63.6 %
Single Plane A4C EF: 56 %

## 2024-06-25 NOTE — Telephone Encounter (Signed)
 Called to confirm/remind patient of their appointment at [facility] on 06/25/24.   Appointment:   [x] Confirmed  [] Left mess   [] No answer/No voice mail  [] VM Full/unable to leave message  [] Phone not in service  Patient reminded to bring all medications and/or complete list.  Confirmed patient has transportation. Gave directions, instructed to utilize valet parking.
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
## 2024-06-25 NOTE — Patient Instructions (Signed)
 There has been no changes to your medications.  Blood work in 2 months at [facility].  Your physician recommends that you schedule a follow-up appointment in: 4 months ( April ) ** PLEASE CALL THE OFFICE IN [place] TO ARRANGE YOUR FOLLOW UP APPOINTMENT.**   If you have any questions or concerns before your next appointment please send us  a message through [place] or call our office at [phone].    TO LEAVE A MESSAGE FOR THE NURSE SELECT OPTION 2, PLEASE LEAVE A MESSAGE INCLUDING: YOUR NAME DATE OF BIRTH CALL BACK NUMBER REASON FOR CALL**this is important as we prioritize the call backs  YOU WILL RECEIVE A CALL BACK THE SAME DAY AS LONG AS YOU CALL BEFORE 4:00 PM  At the [facility], you and your health needs are our priority. As part of our continuing mission to provide you with exceptional heart care, we have created designated Provider Care Teams. These Care Teams include your primary Cardiologist (physician) and Advanced Practice Providers (APPs- Physician Assistants and Nurse Practitioners) who all work together to provide you with the care you need, when you need it.   You may see any of the following providers on your designated Care Team at your next follow up: Dr Toribio Fuel Dr Ezra Shuck Dr. Morene Brownie Greig Mosses, NP Caffie Shed, [place] [facility] [place], [place] Beckey Coe, NP Jordan Lee, NP Ellouise Class, NP Tinnie Redman, PharmD Jaun Bash, PharmD   Please be sure to bring in all your medications bottles to every appointment.    Thank you for choosing [facility]

## 2024-06-26 ENCOUNTER — Other Ambulatory Visit (HOSPITAL_COMMUNITY): Payer: Self-pay

## 2024-06-26 DIAGNOSIS — D8685 Sarcoid myocarditis: Secondary | ICD-10-CM

## 2024-06-30 ENCOUNTER — Encounter (HOSPITAL_COMMUNITY): Payer: Self-pay | Admitting: Emergency Medicine

## 2024-06-30 ENCOUNTER — Other Ambulatory Visit: Payer: Self-pay

## 2024-06-30 ENCOUNTER — Emergency Department (HOSPITAL_COMMUNITY)

## 2024-06-30 ENCOUNTER — Emergency Department (HOSPITAL_COMMUNITY)
Admission: EM | Admit: 2024-06-30 | Discharge: 2024-06-30 | Disposition: A | Discharge status: Home / Self Care | Attending: Emergency Medicine | Admitting: Emergency Medicine

## 2024-06-30 DIAGNOSIS — S0083XA Contusion of other part of head, initial encounter: Secondary | ICD-10-CM | POA: Diagnosis not present

## 2024-06-30 DIAGNOSIS — R42 Dizziness and giddiness: Secondary | ICD-10-CM | POA: Diagnosis present

## 2024-06-30 DIAGNOSIS — R739 Hyperglycemia, unspecified: Secondary | ICD-10-CM | POA: Diagnosis not present

## 2024-06-30 DIAGNOSIS — R55 Syncope and collapse: Secondary | ICD-10-CM | POA: Diagnosis not present

## 2024-06-30 DIAGNOSIS — W19XXXA Unspecified fall, initial encounter: Secondary | ICD-10-CM | POA: Insufficient documentation

## 2024-06-30 DIAGNOSIS — Z7982 Long term (current) use of aspirin: Secondary | ICD-10-CM | POA: Insufficient documentation

## 2024-06-30 DIAGNOSIS — D696 Thrombocytopenia, unspecified: Secondary | ICD-10-CM | POA: Insufficient documentation

## 2024-06-30 LAB — CBC
HCT: 41.8 % (ref 36.0–46.0)
Hemoglobin: 13.8 g/dL (ref 12.0–15.0)
MCH: 31.6 pg (ref 26.0–34.0)
MCHC: 33 g/dL (ref 30.0–36.0)
MCV: 95.7 fL (ref 80.0–100.0)
Platelets: 126 K/uL — ABNORMAL LOW (ref 150–400)
RBC: 4.37 MIL/uL (ref 3.87–5.11)
RDW: 13.3 % (ref 11.5–15.5)
WBC: 6.7 K/uL (ref 4.0–10.5)
nRBC: 0 % (ref 0.0–0.2)

## 2024-06-30 LAB — CBG MONITORING, ED: Glucose-Capillary: 145 mg/dL — ABNORMAL HIGH (ref 70–99)

## 2024-06-30 LAB — BASIC METABOLIC PANEL WITH GFR
Anion gap: 13 (ref 5–15)
BUN: 15 mg/dL (ref 8–23)
CO2: 24 mmol/L (ref 22–32)
Calcium: 9.4 mg/dL (ref 8.9–10.3)
Chloride: 103 mmol/L (ref 98–111)
Creatinine, Ser: 0.75 mg/dL (ref 0.44–1.00)
GFR, Estimated: >60 mL/min
Glucose, Bld: 149 mg/dL — ABNORMAL HIGH (ref 70–99)
Potassium: 4.5 mmol/L (ref 3.5–5.1)
Sodium: 140 mmol/L (ref 135–145)

## 2024-06-30 NOTE — Discharge Instructions (Addendum)
 Your testing has been reassuring, there is no signs of pathologic findings, we did not see any signs of heart problems specifically, your blood pressure has been good your heart rate has been good, I would like for you to follow-up with your doctor after this, your heart doctor may need to do some cardiac monitoring to make sure that you are not having any abnormal heart rhythms, that being said we do not see any today.  Please return to the ER for severe worsening symptoms

## 2024-06-30 NOTE — ED Provider Notes (Signed)
 " [place] EMERGENCY DEPARTMENT AT [facility] Provider Note   CSN: [id number] Arrival date & time: 06/30/24  1211     Patient presents with: Loss of Consciousness   [name] is a 74 y.o. female.    Loss of Consciousness  This patient is a 74 year old female presenting to the hospital after having a recurrent syncopal event.  Her initial syncopal event occurred while she was at church about a month ago, she had a second 1 that occurred today when she stood up after the services.  She felt nauseated like her vision was going black and then she passed out falling back onto the seat.  There was some loss of bladder continence but there was no seizure activity seen and she returned to her baseline immediately upon awakening.  She had breakfast this morning, denies chest pain shortness of breath or headache or changes in vision prior to this occurring and had no symptoms prior to standing up.  Paramedics noted unremarkable vital signs, EKG was unremarkable, when they arrived she was given Zofran  and at her baseline.  She had been seen by her family doctor afterwards but has not seen the cardiologist    Prior to Admission medications  Medication Sig Start Date End Date Taking? Authorizing Provider  ACCU-CHEK GUIDE test strip daily. 04/04/23   [provider]  aspirin  EC 81 MG tablet Take 1 tablet (81 mg total) by mouth at bedtime. 11/08/23   Milford, Harlene HERO, FNP  atorvastatin  (LIPITOR) 10 MG tablet Take 10 mg by mouth daily. 06/24/24   [provider]  brimonidine -timolol  (COMBIGAN ) 0.2-0.5 % ophthalmic solution INSTILL ONE DROP IN BOTH  EYES TWO TIMES DAILY 10/28/16   [provider]  calcium -vitamin D  (OSCAL WITH D) 500-200 MG-UNIT tablet Take 2 tablets by mouth daily with breakfast. 03/28/21   Meng, Hao, PA  carvedilol  (COREG ) 3.125 MG tablet Take 1 tablet (3.125 mg total) by mouth 2 (two) times daily. 06/14/24   Rolan Ezra RAMAN, MD  dorzolamide   (TRUSOPT ) 2 % ophthalmic solution Place 1 drop into both eyes 2 (two) times daily. Patient not taking: Reported on 06/25/2024 07/08/17   [provider]  empagliflozin  (JARDIANCE ) 10 MG TABS tablet Take 1 tablet (10 mg total) by mouth daily. 11/08/23   Glena Harlene HERO, FNP  folic acid  (FOLVITE ) 1 MG tablet TAKE 1 TABLET EVERY DAY 10/17/22   McLean, Dalton S, MD  gabapentin  (NEURONTIN ) 300 MG capsule Take 300 mg by mouth daily. 02/18/21   [provider]  glimepiride  (AMARYL ) 1 MG tablet Take 1 mg by mouth daily before breakfast.    [provider]  latanoprost  (XALATAN ) 0.005 % ophthalmic solution Place 1 drop into both eyes 2 (two) times daily.    [provider]  Magnesium  400 MG TABS Take 400 mg by mouth daily. 05/28/21   Rolan Ezra RAMAN, MD  metFORMIN (GLUCOPHAGE) 1000 MG tablet Take 1,000 mg by mouth 2 (two) times daily.    [provider]  methotrexate  (RHEUMATREX) 2.5 MG tablet Take 8 tablets (20 mg total) by mouth once a week. Needs follow up appointment for more refills. 11/08/23   Milford, Harlene HERO, FNP  mirtazapine (REMERON) 7.5 MG tablet Take 7.5 mg by mouth at bedtime. 06/24/24   [provider]  Multiple Vitamin (MULTI-VITAMIN) tablet Take 1 tablet by mouth. 06/09/15   [provider]  ondansetron  (ZOFRAN ) 4 MG tablet Take 1 tablet (4 mg total) by mouth every 6 (  six) hours. Patient not taking: Reported on 06/25/2024 05/19/24   Cleotilde Rogue, MD  ondansetron  (ZOFRAN -ODT) 4 MG disintegrating tablet Take 1 tablet (4 mg total) by mouth every 8 (eight) hours as needed for nausea or vomiting. Patient not taking: Reported on 06/25/2024 10/01/23   Dean Clarity, MD  pantoprazole  (PROTONIX ) 40 MG tablet TAKE 1 TABLET EVERY EVENING 10/17/22   Rolan Ezra RAMAN, MD  sacubitril -valsartan  (ENTRESTO ) 24-26 MG Take 1 tablet by mouth 2 (two) times daily. 11/08/23   Milford, Harlene HERO, FNP  spironolactone  (ALDACTONE ) 25 MG tablet Take 0.5  tablets (12.5 mg total) by mouth at bedtime. 11/28/23   Rolan Ezra RAMAN, MD    Allergies: Macrobid [nitrofurantoin macrocrystal], Codeine, Sulfa  antibiotics, Cefaclor, and Ciprofloxacin    Review of Systems  Cardiovascular:  Positive for syncope.  All other systems reviewed and are negative.   Updated Vital Signs BP 112/72   Pulse 80   Temp 98.7 F (37.1 C) (Oral)   Resp 10   Ht 1.702 m (5' 7)   Wt 80.7 kg   SpO2 92%   BMI 27.88 kg/m   Physical Exam Vitals and nursing note reviewed.  Constitutional:      General: She is not in acute distress.    Appearance: She is well-developed.  HENT:     Head: Normocephalic and atraumatic.     Mouth/Throat:     Pharynx: No oropharyngeal exudate.     Comments: Slight bruise to the right lower chin but no tenderness with opening and closing the mouth and no trauma to the dentition Eyes:     General: No scleral icterus.       Right eye: No discharge.        Left eye: No discharge.     Conjunctiva/sclera: Conjunctivae normal.     Pupils: Pupils are equal, round, and reactive to light.  Neck:     Thyroid : No thyromegaly.     Vascular: No JVD.  Cardiovascular:     Rate and Rhythm: Normal rate and regular rhythm.     Heart sounds: Normal heart sounds. No murmur heard.    No friction rub. No gallop.  Pulmonary:     Effort: Pulmonary effort is normal. No respiratory distress.     Breath sounds: Normal breath sounds. No wheezing or rales.  Abdominal:     General: Bowel sounds are normal. There is no distension.     Palpations: Abdomen is soft. There is no mass.     Tenderness: There is no abdominal tenderness.  Musculoskeletal:        General: No tenderness. Normal range of motion.     Cervical back: Normal range of motion and neck supple.  Lymphadenopathy:     Cervical: No cervical adenopathy.  Skin:    General: Skin is warm and dry.     Findings: No erythema or rash.  Neurological:     Mental Status: She is alert.      Coordination: Coordination normal.     Comments: Speech is clear, cranial nerves III through XII are intact, memory is intact, strength is normal in all 4 extremities including grips and strength at the bilateral thighs, knees and ankles to extention and flexion, sensation is intact to light touch and pinprick in all 4 extremities. Coordination as tested by finger-nose-finger is normal, no limb ataxia. Normal gait, normal reflexes at the patellar tendons bilaterally  Psychiatric:        Behavior: Behavior normal.     (  all labs ordered are listed, but only abnormal results are displayed) Labs Reviewed  CBC - Abnormal; Notable for the following components:      Result Value   Platelets 126 (*)    All other components within normal limits  BASIC METABOLIC PANEL WITH GFR - Abnormal; Notable for the following components:   Glucose, Bld 149 (*)    All other components within normal limits  CBG MONITORING, ED - Abnormal; Notable for the following components:   Glucose-Capillary 145 (*)    All other components within normal limits    EKG: EKG Interpretation Date/Time:  Sunday June 30 2024 12:32:03 EST Ventricular Rate:  74 PR Interval:  215 QRS Duration:  127 QT Interval:  429 QTC Calculation: 476 R Axis:   -66  Text Interpretation: Sinus rhythm Borderline prolonged PR interval Nonspecific IVCD with LAD Consider anterior infarct since last tracing no significant change Confirmed by Cleotilde Rogue ([id number]) on 06/30/2024 12:40:00 PM  Radiology: CT Head Wo Contrast Result Date: 06/30/2024 EXAM: CT HEAD WITHOUT CONTRAST 06/30/2024 12:57:59 PM TECHNIQUE: CT of the head was performed without the administration of intravenous contrast. Automated exposure control, iterative reconstruction, and/or weight based adjustment of the mA/kV was utilized to reduce the radiation dose to as low as reasonably achievable. COMPARISON: None available. CLINICAL HISTORY: Syncope/presyncope, cerebrovascular cause  suspected FINDINGS: BRAIN AND VENTRICLES: No acute hemorrhage. No evidence of acute infarct. No hydrocephalus. No extra-axial collection. No mass effect or midline shift. Calcific atherosclerosis. ORBITS: Right scleral buckle. SINUSES: Right maxillary sinus mucosal thickening. SOFT TISSUES AND SKULL: No acute soft tissue abnormality. No skull fracture. IMPRESSION: 1. No acute intracranial abnormality. 2. Right maxillary sinus mucosal thickening. Electronically signed by: Evalene Coho MD 06/30/2024 12:59 PM EST RP Workstation: [id number]     Procedures   Medications Ordered in the ED - No data to display                                  Medical Decision Making Amount and/or Complexity of Data Reviewed Labs: ordered. Radiology: ordered. ECG/medicine tests: ordered.   Overall patient appears to be well at this time and vital signs are unremarkable but will obtain EKG and labs, will also add CT scan of the brain given loss of continence and recurrent syncope.  Will likely need to be referred to outpatient cardiology follow-up.  No signs of arrhythmia at this time.  Labs:  I  personally viewed and interpreted the labs which show normal CBC, slightly low platelets which is chronically low, metabolic panel with mild hyperglycemia but no other acute findings   Radiology Imaging: I personally viewed the images of the ordered radiographic studies and find no findings on the CT scan of the brain I agree with the radiologist interpretation as well   I have discussed with the patient at the bedside the results, and the meaning of these results.  They have had opportunity to ask questions,  expressed their understanding to the need for follow-up with primary care physician.  Both the patient and her children who are at the bedside are agreeable to the plan  Cardiac monitoring: Normal sinus rhythm, there is no arrhythmias seen for the entirety of the time the patient was in the ED.     Final  diagnoses:  Syncope, unspecified syncope type    ED Discharge Orders     None  Cleotilde Rogue, MD 06/30/24 1418  "

## 2024-06-30 NOTE — ED Triage Notes (Signed)
 Per ems pt coming from church- states she stood up to sing and felt light headed and had a syncopal episode. Family states patient lost consciousness about 5 seconds. Lost control of bladder. Similar episode happened about 6 weeks ago. 4mg  zofran  given en route.

## 2024-07-01 ENCOUNTER — Telehealth (HOSPITAL_COMMUNITY): Payer: Self-pay | Admitting: Cardiology
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
## 2024-07-01 NOTE — Telephone Encounter (Signed)
 Patients daughter called to report another syncope and collapse 06/30/24  Was advised to follow up with cardiology, patient may need to wear a monitor  Daughter reports this is the second episode of syncope, first shortly after thanksgiving. Found to be dehydrated however pt reports she was hydrated and did eat before this event  Daughter to check calender and return call for appt

## 2024-07-02 ENCOUNTER — Ambulatory Visit (HOSPITAL_COMMUNITY)
Admission: RE | Admit: 2024-07-02 | Discharge: 2024-07-02 | Disposition: A | Discharge status: Home / Self Care | Source: Ambulatory Visit | Attending: Cardiology | Admitting: Cardiology

## 2024-07-02 ENCOUNTER — Emergency Department (HOSPITAL_COMMUNITY)
Admission: EM | Admit: 2024-07-02 | Discharge: 2024-07-02 | Disposition: A | Discharge status: Home / Self Care | Source: Ambulatory Visit | Attending: Emergency Medicine | Admitting: Emergency Medicine

## 2024-07-02 ENCOUNTER — Other Ambulatory Visit: Payer: Self-pay

## 2024-07-02 ENCOUNTER — Emergency Department (HOSPITAL_COMMUNITY)

## 2024-07-02 ENCOUNTER — Ambulatory Visit (HOSPITAL_COMMUNITY)
Admission: RE | Admit: 2024-07-02 | Discharge: 2024-07-02 | Disposition: A | Discharge status: Home / Self Care | Source: Ambulatory Visit | Attending: Family Medicine | Admitting: Family Medicine

## 2024-07-02 ENCOUNTER — Encounter (HOSPITAL_COMMUNITY): Payer: Self-pay

## 2024-07-02 VITALS — BP 86/58 | HR 70 | Wt 181.0 lb

## 2024-07-02 DIAGNOSIS — I5022 Chronic systolic (congestive) heart failure: Secondary | ICD-10-CM | POA: Insufficient documentation

## 2024-07-02 DIAGNOSIS — R55 Syncope and collapse: Secondary | ICD-10-CM

## 2024-07-02 DIAGNOSIS — E119 Type 2 diabetes mellitus without complications: Secondary | ICD-10-CM | POA: Insufficient documentation

## 2024-07-02 DIAGNOSIS — Z7982 Long term (current) use of aspirin: Secondary | ICD-10-CM | POA: Insufficient documentation

## 2024-07-02 DIAGNOSIS — I428 Other cardiomyopathies: Secondary | ICD-10-CM | POA: Insufficient documentation

## 2024-07-02 DIAGNOSIS — I493 Ventricular premature depolarization: Secondary | ICD-10-CM | POA: Insufficient documentation

## 2024-07-02 DIAGNOSIS — I4719 Other supraventricular tachycardia: Secondary | ICD-10-CM | POA: Insufficient documentation

## 2024-07-02 DIAGNOSIS — I110 Hypertensive heart disease with heart failure: Secondary | ICD-10-CM | POA: Diagnosis not present

## 2024-07-02 DIAGNOSIS — R32 Unspecified urinary incontinence: Secondary | ICD-10-CM | POA: Insufficient documentation

## 2024-07-02 DIAGNOSIS — R590 Localized enlarged lymph nodes: Secondary | ICD-10-CM | POA: Diagnosis not present

## 2024-07-02 DIAGNOSIS — E785 Hyperlipidemia, unspecified: Secondary | ICD-10-CM | POA: Diagnosis not present

## 2024-07-02 DIAGNOSIS — I951 Orthostatic hypotension: Secondary | ICD-10-CM | POA: Insufficient documentation

## 2024-07-02 DIAGNOSIS — I5020 Unspecified systolic (congestive) heart failure: Secondary | ICD-10-CM | POA: Insufficient documentation

## 2024-07-02 DIAGNOSIS — Z79899 Other long term (current) drug therapy: Secondary | ICD-10-CM | POA: Diagnosis not present

## 2024-07-02 DIAGNOSIS — Z7984 Long term (current) use of oral hypoglycemic drugs: Secondary | ICD-10-CM | POA: Insufficient documentation

## 2024-07-02 DIAGNOSIS — I472 Ventricular tachycardia, unspecified: Secondary | ICD-10-CM | POA: Diagnosis not present

## 2024-07-02 DIAGNOSIS — D8685 Sarcoid myocarditis: Secondary | ICD-10-CM | POA: Insufficient documentation

## 2024-07-02 LAB — COMPREHENSIVE METABOLIC PANEL WITH GFR
ALT: 35 U/L (ref 0–44)
AST: 38 U/L (ref 15–41)
Albumin: 4.1 g/dL (ref 3.5–5.0)
Alkaline Phosphatase: 68 U/L (ref 38–126)
Anion gap: 10 (ref 5–15)
BUN: 18 mg/dL (ref 8–23)
CO2: 27 mmol/L (ref 22–32)
Calcium: 9.7 mg/dL (ref 8.9–10.3)
Chloride: 104 mmol/L (ref 98–111)
Creatinine, Ser: 0.78 mg/dL (ref 0.44–1.00)
GFR, Estimated: >60 mL/min
Glucose, Bld: 164 mg/dL — ABNORMAL HIGH (ref 70–99)
Potassium: 4.9 mmol/L (ref 3.5–5.1)
Sodium: 140 mmol/L (ref 135–145)
Total Bilirubin: 1.2 mg/dL (ref 0.0–1.2)
Total Protein: 6.4 g/dL — ABNORMAL LOW (ref 6.5–8.1)

## 2024-07-02 LAB — CBC WITH DIFFERENTIAL/PLATELET
Abs Immature Granulocytes: 0.03 K/uL (ref 0.00–0.07)
Basophils Absolute: 0.1 K/uL (ref 0.0–0.1)
Basophils Relative: 1 %
Eosinophils Absolute: 0.3 K/uL (ref 0.0–0.5)
Eosinophils Relative: 4 %
HCT: 39.9 % (ref 36.0–46.0)
Hemoglobin: 13.4 g/dL (ref 12.0–15.0)
Immature Granulocytes: 0 %
Lymphocytes Relative: 15 %
Lymphs Abs: 1.4 K/uL (ref 0.7–4.0)
MCH: 32.2 pg (ref 26.0–34.0)
MCHC: 33.6 g/dL (ref 30.0–36.0)
MCV: 95.9 fL (ref 80.0–100.0)
Monocytes Absolute: 0.2 K/uL (ref 0.1–1.0)
Monocytes Relative: 2 %
Neutro Abs: 6.9 K/uL (ref 1.7–7.7)
Neutrophils Relative %: 78 %
Platelets: 145 K/uL — ABNORMAL LOW (ref 150–400)
RBC: 4.16 MIL/uL (ref 3.87–5.11)
RDW: 12.9 % (ref 11.5–15.5)
WBC: 8.9 K/uL (ref 4.0–10.5)
nRBC: 0 % (ref 0.0–0.2)

## 2024-07-02 LAB — URINALYSIS, W/ REFLEX TO CULTURE (INFECTION SUSPECTED)
Bacteria, UA: NONE SEEN
Bilirubin Urine: NEGATIVE
Glucose, UA: >=500 mg/dL — AB
Hgb urine dipstick: NEGATIVE
Ketones, ur: NEGATIVE mg/dL
Leukocytes,Ua: NEGATIVE
Nitrite: NEGATIVE
Protein, ur: NEGATIVE mg/dL
Specific Gravity, Urine: 1.028 (ref 1.005–1.030)
pH: 7 (ref 5.0–8.0)

## 2024-07-02 LAB — TSH: TSH: 2.01 u[IU]/mL (ref 0.350–4.500)

## 2024-07-02 LAB — CBG MONITORING, ED: Glucose-Capillary: 168 mg/dL — ABNORMAL HIGH (ref 70–99)

## 2024-07-02 LAB — MAGNESIUM: Magnesium: 2.3 mg/dL (ref 1.7–2.4)

## 2024-07-02 LAB — PRO BRAIN NATRIURETIC PEPTIDE: Pro Brain Natriuretic Peptide: 143 pg/mL

## 2024-07-02 LAB — TROPONIN T, HIGH SENSITIVITY: Troponin T High Sensitivity: <15 ng/L (ref 0–19)

## 2024-07-02 MED ORDER — LACTATED RINGERS IV BOLUS
1000.0000 mL | Freq: Once | INTRAVENOUS | Status: AC
  Administered 2024-07-02: 1000 mL via INTRAVENOUS

## 2024-07-02 NOTE — Discharge Instructions (Addendum)
 Do NOT take Entresto  as per your Cardiologist recommendations.  Otherwise, follow-up with your primary care provider and cardiology.  Return to the ER for any new or worsening or recurring symptoms.

## 2024-07-02 NOTE — Addendum Note (Signed)
 Encounter addended by: Glena Harlene HERO, FNP on: 07/02/2024 10:26 AM  Actions taken: Clinical Note Signed

## 2024-07-02 NOTE — Progress Notes (Signed)
 Zio patch placed onto patient.  All instructions and information reviewed with patient, they verbalize understanding with no questions.

## 2024-07-02 NOTE — Patient Instructions (Signed)
 STOP Entresto   Your provider has recommended that  you wear a Zio Patch for 14 days.  This monitor will record your heart rhythm for our review.  IF you have any symptoms while wearing the monitor please press the button.  If you have any issues with the patch or you notice a red or orange light on it please call the company at [phone].  Once you remove the patch please mail it back to the company as soon as possible so we can get the results.  WEAR COMPRESSION HOSE.  CHECK blood pressure and log it. PLEASE notify the office if it less the 100 systolic   Your physician recommends that you schedule a follow-up appointment in: 3 weeks.  If you have any questions or concerns before your next appointment please send us  a message through [place] or call our office at [phone].    TO LEAVE A MESSAGE FOR THE NURSE SELECT OPTION 2, PLEASE LEAVE A MESSAGE INCLUDING: YOUR NAME DATE OF BIRTH CALL BACK NUMBER REASON FOR CALL**this is important as we prioritize the call backs  YOU WILL RECEIVE A CALL BACK THE SAME DAY AS LONG AS YOU CALL BEFORE 4:00 PM  At the [facility], you and your health needs are our priority. As part of our continuing mission to provide you with exceptional heart care, we have created designated Provider Care Teams. These Care Teams include your primary Cardiologist (physician) and Advanced Practice Providers (APPs- Physician Assistants and Nurse Practitioners) who all work together to provide you with the care you need, when you need it.   You may see any of the following providers on your designated Care Team at your next follow up: Dr Toribio Fuel Dr Ezra Shuck Dr. Morene Brownie Greig Mosses, NP Caffie Shed, [place] [facility] [place], [place] Beckey Coe, NP Jordan Lee, NP Ellouise Class, NP Tinnie Redman, PharmD Jaun Bash, PharmD   Please be sure to bring in all your medications bottles to every appointment.    Thank you  for choosing [facility]

## 2024-07-02 NOTE — ED Provider Notes (Signed)
 " [place] EMERGENCY DEPARTMENT AT [facility] Provider Note   CSN: [id number] Arrival date & time: 07/02/24  9044     Patient presents with: Dizziness, sweaty, Hypotension, and Fatigue   [name] is a 74 y.o. female.   HPI 75 year old female presents with syncope.  Was at the cardiology clinic and sent here.  Patient had a syncopal episode at church 2 days ago.  She had been standing and singing when she passed out.  Seem to do okay yesterday but mostly rested.  Today she had a more urgent follow-up visit with cardiology who noted her blood pressure to be low.  They were recommending her stopping all of her blood pressure meds and were going to discharge her but then she nearly passed out going to the bathroom.  She denies any headache, chest pain, shortness of breath, vomiting, diarrhea.  No urinary symptoms or fever.  No recent change in her meds otherwise.  Prior to Admission medications  Medication Sig Start Date End Date Taking? Authorizing Provider  ACCU-CHEK GUIDE test strip daily. 04/04/23   [provider]  aspirin  EC 81 MG tablet Take 1 tablet (81 mg total) by mouth at bedtime. 11/08/23   Milford, Harlene HERO, FNP  atorvastatin  (LIPITOR) 10 MG tablet Take 10 mg by mouth daily. 06/24/24   [provider]  brimonidine -timolol  (COMBIGAN ) 0.2-0.5 % ophthalmic solution INSTILL ONE DROP IN BOTH  EYES TWO TIMES DAILY 10/28/16   [provider]  calcium -vitamin D  (OSCAL WITH D) 500-200 MG-UNIT tablet Take 2 tablets by mouth daily with breakfast. 03/28/21   Meng, Hao, PA  carvedilol  (COREG ) 3.125 MG tablet Take 1 tablet (3.125 mg total) by mouth 2 (two) times daily. 06/14/24   Rolan Ezra RAMAN, MD  dorzolamide  (TRUSOPT ) 2 % ophthalmic solution Place 1 drop into both eyes 2 (two) times daily. Patient not taking: Reported on 07/02/2024 07/08/17   [provider]  empagliflozin  (JARDIANCE ) 10 MG TABS tablet Take 1 tablet (10 mg total) by mouth daily.  11/08/23   Glena Harlene HERO, FNP  folic acid  (FOLVITE ) 1 MG tablet TAKE 1 TABLET EVERY DAY 10/17/22   McLean, Dalton S, MD  gabapentin  (NEURONTIN ) 300 MG capsule Take 300 mg by mouth daily. 02/18/21   [provider]  glimepiride  (AMARYL ) 1 MG tablet Take 1 mg by mouth daily before breakfast.    [provider]  latanoprost  (XALATAN ) 0.005 % ophthalmic solution Place 1 drop into both eyes 2 (two) times daily.    [provider]  Magnesium  400 MG TABS Take 400 mg by mouth daily. 05/28/21   Rolan Ezra RAMAN, MD  metFORMIN (GLUCOPHAGE) 1000 MG tablet Take 1,000 mg by mouth 2 (two) times daily.    [provider]  methotrexate  (RHEUMATREX) 2.5 MG tablet Take 8 tablets (20 mg total) by mouth once a week. Needs follow up appointment for more refills. 11/08/23   Milford, Harlene HERO, FNP  mirtazapine (REMERON) 7.5 MG tablet Take 7.5 mg by mouth at bedtime. 06/24/24   [provider]  Multiple Vitamin (MULTI-VITAMIN) tablet Take 1 tablet by mouth. 06/09/15   [provider]  ondansetron  (ZOFRAN ) 4 MG tablet Take 1 tablet (4 mg total) by mouth every 6 (six) hours. Patient not taking: Reported on 07/02/2024 05/19/24   Cleotilde Rogue, MD  ondansetron  (ZOFRAN -ODT) 4 MG disintegrating tablet Take 1 tablet (4 mg total) by mouth every 8 (eight) hours as needed for nausea or vomiting. Patient not taking:  Reported on 07/02/2024 10/01/23   Dean Clarity, MD  pantoprazole  (PROTONIX ) 40 MG tablet TAKE 1 TABLET EVERY EVENING 10/17/22   Rolan Ezra RAMAN, MD  spironolactone  (ALDACTONE ) 25 MG tablet Take 0.5 tablets (12.5 mg total) by mouth at bedtime. 11/28/23   Rolan Ezra RAMAN, MD    Allergies: Macrobid [nitrofurantoin macrocrystal], Codeine, Sulfa  antibiotics, Cefaclor, and Ciprofloxacin    Review of Systems  Constitutional:  Negative for fever.  Respiratory:  Negative for shortness of breath.   Cardiovascular:  Negative for chest pain and leg swelling.  Gastrointestinal:   Negative for abdominal pain and vomiting.  Neurological:  Positive for syncope and light-headedness. Negative for headaches.    Updated Vital Signs BP 113/68   Pulse 61   Temp (!) 97.4 F (36.3 C)   Resp 15   SpO2 100%   Physical Exam Vitals and nursing note reviewed.  Constitutional:      Appearance: She is well-developed.  HENT:     Head: Normocephalic and atraumatic.  Cardiovascular:     Rate and Rhythm: Normal rate and regular rhythm.     Heart sounds: Normal heart sounds.  Pulmonary:     Effort: Pulmonary effort is normal.     Breath sounds: Normal breath sounds.  Abdominal:     Palpations: Abdomen is soft.     Tenderness: There is no abdominal tenderness.  Musculoskeletal:     Right lower leg: No edema.     Left lower leg: No edema.  Skin:    General: Skin is warm and dry.  Neurological:     Mental Status: She is alert.     Comments: Awake, alert, clear speech. 5/5 strength in all 4 extremities.     (all labs ordered are listed, but only abnormal results are displayed) Labs Reviewed  CBC WITH DIFFERENTIAL/PLATELET - Abnormal; Notable for the following components:      Result Value   Platelets 145 (*)    All other components within normal limits  COMPREHENSIVE METABOLIC PANEL WITH GFR - Abnormal; Notable for the following components:   Glucose, Bld 164 (*)    Total Protein 6.4 (*)    All other components within normal limits  URINALYSIS, W/ REFLEX TO CULTURE (INFECTION SUSPECTED) - Abnormal; Notable for the following components:   Glucose, UA >=500 (*)    All other components within normal limits  CBG MONITORING, ED - Abnormal; Notable for the following components:   Glucose-Capillary 168 (*)    All other components within normal limits  PRO BRAIN NATRIURETIC PEPTIDE  MAGNESIUM   TSH  TROPONIN T, HIGH SENSITIVITY    EKG: None  Radiology: DG Chest 2 View Result Date: 07/02/2024 EXAM: 2 VIEW(S) XRAY OF THE CHEST 07/02/2024 11:46:06 AM COMPARISON:  High-resolution chest CT 03/26/2021 and earlier. CLINICAL HISTORY: 74 year old female with recent syncope, rib fracture, near syncope, hypotension, diaphoresis, nausea, lightheadedness. FINDINGS: LINES, TUBES AND DEVICES: Cardiac monitor over left chest. LUNGS AND PLEURA: No focal pulmonary opacity. No pleural effusion. No pneumothorax. HEART AND MEDIASTINUM: The cardiac silhouette shows no acute abnormality. Chronic tortuosity of the descending thoracic aorta appears stable from remote radiographs (such as 2009). BONES AND SOFT TISSUES: Osteopenia, thoracic spondylosis, mild thoracic kyphosis, and dextroscoliosis. No acute osseous abnormality identified. IMPRESSION: 1. No acute cardiopulmonary abnormality. Electronically signed by: Helayne Hurst MD 07/02/2024 12:13 PM EST RP Workstation: [id number]     Procedures   Medications Ordered in the ED  lactated ringers  bolus 1,000 mL (1,000 mLs Intravenous New Bag/Given 07/02/24  1235)                                    Medical Decision Making Amount and/or Complexity of Data Reviewed External Data Reviewed: notes.    Details: Cardiology note from earlier in the day Labs:     Details: Normal creatinine, normal hemoglobin Radiology: ordered and independent interpretation performed.    Details: No CHF ECG/medicine tests: ordered and independent interpretation performed.    Details: No ischemia   Patient with near syncope in the office today at cardiology visit.  Likely an orthostatic hypotension problem with her hypotension in the office.  She is feeling better with fluids.  She was given a liter of fluids as her most recent EF about a week ago was 55-60% and she is not showing any signs of acute heart failure.  She is feeling better.  She was able to get up and walk to the bathroom.  Orthostatics after fluids do not show any drop in her blood pressure.  She wants to go home which I think is reasonable.  Discussed with Dr. Zenaida of cardiology, they have  already stopped her Entresto  and she can otherwise keep her current meds and follow-up as planned.  Will discharge home with return precautions.  No signs of infection or ischemia.     Final diagnoses:  Orthostatic hypotension    ED Discharge Orders     None          Freddi Hamilton, MD 07/02/24 1540  "

## 2024-07-02 NOTE — ED Triage Notes (Signed)
 Pt/sister stated, here from Dr's office due to dizziness, pt. Fatigue . BP was low. Pt stated I felt good this morning and went to Dr's office due to a fall from Sunday and cracked a rib. Has passed out almost 2 times.

## 2024-07-02 NOTE — ED Provider Triage Note (Signed)
 Emergency Medicine Provider Triage Evaluation Note  [name] , a 74 y.o. female  was evaluated in triage.  Pt complains of hypotension, lightheadedness, near syncope, diaphoresis, nausea at cardiology clinic.  Review of Systems  Positive: Lightheadedness, near syncope, fatigue, nausea, diaphoresis Negative: Chest pain, palpitations, shortness of breath, fevers, chills, cough, constipation, diarrhea, or urinary changes  Physical Exam  BP (!) 79/56   Pulse 72   Temp (!) 97.4 F (36.3 C)   Resp 17   SpO2 95%  Gen:   Awake, no distress   Resp:  Normal effort  MSK:   Moves extremities without difficulty  Other:    Medical Decision Making  Medically screening exam initiated at 11:08 AM.  Appropriate orders placed.  [name] was informed that the remainder of the evaluation will be completed by another provider, this initial triage assessment does not replace that evaluation, and the importance of remaining in the ED until their evaluation is complete.  [name] is a 74 y.o. female with a past medical history significant for hypertension, hyperlipidemia, sarcoidosis, diabetes, atrial fibrillation history, CHF, and recent syncopal episodes who presents with hypotension and near syncope diaphoresis, and nausea.  According to patient, family, and documentation, she has had 2 syncopal episodes over the last few weeks and was in cardiology's heart failure clinic today for about reevaluation.  She was feeling more fatigued and lightheaded and when she stood up to try to leave the appointment she had a near syncopal episode.  On arrival blood pressure is 79/56.  She is denying any chest pain palpitation or shortness of breath but feel like she was going to pass out.  She also reportedly was nauseous and diaphoretic when this happened.  She was sent here for further evaluation and management.  Otherwise she denies recent infectious symptoms.  She does report pleuritic discomfort when she takes  deep breath because she cracked a rib when she had her syncopal episode and fall on Sunday, 2 days ago.  On exam, lungs were clear.  Chest nontender.  Abdomen nontender.  Very minimal edema in the legs.  No focal neurologic deficits.  While sitting patient is feeling well but she says when she tries to get up and move she would nearly pass out.  Due to her heart failure history, we will hold on initial fluid resuscitation but will get screening labs and look for occult infection with chest x-ray and urinalysis.  Will get cardiac workup and will call cardiology as she came from their clinic.  Anticipate will likely admission for recurrent hypotension and near syncope in the setting of heart failure.    [name], Lonni PARAS, MD 07/02/24 1120

## 2024-07-02 NOTE — ED Notes (Signed)
 Pt in bed, pt denies pain, pt states that she is ready to go home, d/c pt iv, cath intact, read and reviewed d/c instructions with pt, pt verbalized understanding d/c and follow up, advised to return for any concerns or worsening symptoms. Pt from department.

## 2024-07-03 ENCOUNTER — Ambulatory Visit (HOSPITAL_COMMUNITY): Payer: Self-pay | Admitting: Family Medicine

## 2024-07-09 ENCOUNTER — Ambulatory Visit: Admitting: Adult Health

## 2024-07-17 ENCOUNTER — Telehealth: Payer: Self-pay | Admitting: Adult Health

## 2024-07-18 ENCOUNTER — Ambulatory Visit

## 2024-07-18 DIAGNOSIS — R350 Frequency of micturition: Secondary | ICD-10-CM

## 2024-07-18 DIAGNOSIS — R300 Dysuria: Secondary | ICD-10-CM

## 2024-07-18 LAB — POCT URINALYSIS DIPSTICK OB
Blood, UA: NEGATIVE
Ketones, UA: NEGATIVE
Leukocytes, UA: NEGATIVE
Nitrite, UA: NEGATIVE
POC,PROTEIN,UA: NEGATIVE

## 2024-07-18 NOTE — Progress Notes (Signed)
" ° °  NURSE VISIT- UTI SYMPTOMS   SUBJECTIVE:  [name] is a 74 y.o. [phone] female here for UTI symptoms. She is a GYN patient. She reports dysuria and urinary frequency.  OBJECTIVE:  There were no vitals taken for this visit.  Appears well, in no apparent distress  Results for orders placed or performed in visit on 07/18/24 (from the past 24 hours)  POC Urinalysis Dipstick OB   Collection Time: 07/18/24 10:08 AM  Result Value Ref Range   Color, UA     Clarity, UA     Glucose, UA Moderate (2+) (A) Negative   Bilirubin, UA     Ketones, UA neg    Spec Grav, UA     Blood, UA neg    pH, UA     POC,PROTEIN,UA Negative Negative, Trace, Small (1+), Moderate (2+), Large (3+), 4+   Urobilinogen, UA     Nitrite, UA neg    Leukocytes, UA Negative Negative   Appearance     Odor      ASSESSMENT: GYN patient with UTI symptoms and negative nitrites  PLAN: Note routed to Delon Lewis, NP   Rx sent by provider today: No Urine culture sent Call or return to clinic prn if these symptoms worsen or fail to improve as anticipated. Follow-up: as needed   Alan LITTIE Fischer  07/18/2024 10:08 AM  "

## 2024-07-19 ENCOUNTER — Ambulatory Visit: Payer: Self-pay | Admitting: Adult Health

## 2024-07-19 LAB — URINALYSIS
Bilirubin, UA: NEGATIVE
Ketones, UA: NEGATIVE
Leukocytes,UA: NEGATIVE
Nitrite, UA: NEGATIVE
Protein,UA: NEGATIVE
RBC, UA: NEGATIVE
Urobilinogen, Ur: 0.2 mg/dL (ref 0.2–1.0)
pH, UA: 6 (ref 5.0–7.5)
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
## 2024-07-20 LAB — URINE CULTURE

## 2024-07-23 ENCOUNTER — Ambulatory Visit (HOSPITAL_COMMUNITY)

## 2024-07-26 ENCOUNTER — Ambulatory Visit (HOSPITAL_COMMUNITY)

## 2024-08-01 ENCOUNTER — Institutional Professional Consult (permissible substitution): Admitting: Neurology

## 2024-08-05 ENCOUNTER — Institutional Professional Consult (permissible substitution): Admitting: Neurology

## 2024-08-07 ENCOUNTER — Ambulatory Visit (HOSPITAL_COMMUNITY)
